# Patient Record
Sex: Female | Born: 1980 | Race: White | Hispanic: No | Marital: Married | Smoking: Former smoker
Health system: Southern US, Community
[De-identification: ages and names within clinical notes are randomized; demographics above are authoritative.]

## PROBLEM LIST (undated history)

## (undated) DIAGNOSIS — E7800 Pure hypercholesterolemia, unspecified: Secondary | ICD-10-CM

## (undated) DIAGNOSIS — F419 Anxiety disorder, unspecified: Secondary | ICD-10-CM

## (undated) DIAGNOSIS — K219 Gastro-esophageal reflux disease without esophagitis: Secondary | ICD-10-CM

## (undated) DIAGNOSIS — F32A Depression, unspecified: Secondary | ICD-10-CM

## (undated) DIAGNOSIS — M1990 Unspecified osteoarthritis, unspecified site: Secondary | ICD-10-CM

## (undated) DIAGNOSIS — E119 Type 2 diabetes mellitus without complications: Secondary | ICD-10-CM

## (undated) HISTORY — DX: Gastro-esophageal reflux disease without esophagitis: K21.9

## (undated) HISTORY — DX: Type 2 diabetes mellitus without complications: E11.9

## (undated) HISTORY — PX: TUBAL LIGATION: SHX77

---

## 2001-03-04 ENCOUNTER — Other Ambulatory Visit: Admission: RE | Admit: 2001-03-04 | Discharge: 2001-03-04 | Payer: Self-pay | Admitting: Obstetrics and Gynecology

## 2007-02-12 ENCOUNTER — Emergency Department (HOSPITAL_COMMUNITY): Admission: EM | Admit: 2007-02-12 | Discharge: 2007-02-13 | Payer: Self-pay | Admitting: Emergency Medicine

## 2014-06-18 ENCOUNTER — Inpatient Hospital Stay (HOSPITAL_COMMUNITY): Admit: 2014-06-18 | Payer: Self-pay

## 2014-06-18 ENCOUNTER — Emergency Department (HOSPITAL_COMMUNITY)
Admission: EM | Admit: 2014-06-18 | Discharge: 2014-06-18 | Disposition: A | Discharge status: Home / Self Care | Payer: Self-pay | Attending: Emergency Medicine | Admitting: Emergency Medicine

## 2014-06-18 ENCOUNTER — Encounter (HOSPITAL_COMMUNITY): Payer: Self-pay | Admitting: Emergency Medicine

## 2014-06-18 DIAGNOSIS — Z9851 Tubal ligation status: Secondary | ICD-10-CM | POA: Insufficient documentation

## 2014-06-18 DIAGNOSIS — R109 Unspecified abdominal pain: Secondary | ICD-10-CM | POA: Insufficient documentation

## 2014-06-18 DIAGNOSIS — Z862 Personal history of diseases of the blood and blood-forming organs and certain disorders involving the immune mechanism: Secondary | ICD-10-CM | POA: Insufficient documentation

## 2014-06-18 DIAGNOSIS — R1011 Right upper quadrant pain: Secondary | ICD-10-CM | POA: Insufficient documentation

## 2014-06-18 DIAGNOSIS — Z3202 Encounter for pregnancy test, result negative: Secondary | ICD-10-CM | POA: Insufficient documentation

## 2014-06-18 DIAGNOSIS — Z8639 Personal history of other endocrine, nutritional and metabolic disease: Secondary | ICD-10-CM | POA: Insufficient documentation

## 2014-06-18 DIAGNOSIS — F17200 Nicotine dependence, unspecified, uncomplicated: Secondary | ICD-10-CM | POA: Insufficient documentation

## 2014-06-18 HISTORY — DX: Pure hypercholesterolemia, unspecified: E78.00

## 2014-06-18 LAB — CBC WITH DIFFERENTIAL/PLATELET
BASOS ABS: 0 10*3/uL (ref 0.0–0.1)
BASOS PCT: 0 % (ref 0–1)
EOS ABS: 0.1 10*3/uL (ref 0.0–0.7)
EOS PCT: 1 % (ref 0–5)
HCT: 38.3 % (ref 36.0–46.0)
Hemoglobin: 13.8 g/dL (ref 12.0–15.0)
Lymphocytes Relative: 22 % (ref 12–46)
Lymphs Abs: 2.7 10*3/uL (ref 0.7–4.0)
MCH: 33.4 pg (ref 26.0–34.0)
MCHC: 36 g/dL (ref 30.0–36.0)
MCV: 92.7 fL (ref 78.0–100.0)
MONO ABS: 0.7 10*3/uL (ref 0.1–1.0)
Monocytes Relative: 6 % (ref 3–12)
Neutro Abs: 8.9 10*3/uL — ABNORMAL HIGH (ref 1.7–7.7)
Neutrophils Relative %: 71 % (ref 43–77)
Platelets: 295 10*3/uL (ref 150–400)
RBC: 4.13 MIL/uL (ref 3.87–5.11)
RDW: 12.9 % (ref 11.5–15.5)
WBC: 12.5 10*3/uL — ABNORMAL HIGH (ref 4.0–10.5)

## 2014-06-18 LAB — COMPREHENSIVE METABOLIC PANEL
ALBUMIN: 4.3 g/dL (ref 3.5–5.2)
ALT: 18 U/L (ref 0–35)
AST: 19 U/L (ref 0–37)
Alkaline Phosphatase: 43 U/L (ref 39–117)
Anion gap: 13 (ref 5–15)
BUN: 12 mg/dL (ref 6–23)
CALCIUM: 9.8 mg/dL (ref 8.4–10.5)
CO2: 21 mEq/L (ref 19–32)
CREATININE: 0.95 mg/dL (ref 0.50–1.10)
Chloride: 105 mEq/L (ref 96–112)
GFR calc Af Amer: 90 mL/min — ABNORMAL LOW (ref >90)
GFR calc non Af Amer: 77 mL/min — ABNORMAL LOW (ref >90)
Glucose, Bld: 104 mg/dL — ABNORMAL HIGH (ref 70–99)
Potassium: 3.9 mEq/L (ref 3.7–5.3)
Sodium: 139 mEq/L (ref 137–147)
TOTAL PROTEIN: 7.9 g/dL (ref 6.0–8.3)
Total Bilirubin: 0.2 mg/dL — ABNORMAL LOW (ref 0.3–1.2)

## 2014-06-18 LAB — URINALYSIS, ROUTINE W REFLEX MICROSCOPIC
Bilirubin Urine: NEGATIVE
GLUCOSE, UA: NEGATIVE mg/dL
Ketones, ur: NEGATIVE mg/dL
Nitrite: NEGATIVE
Protein, ur: NEGATIVE mg/dL
Specific Gravity, Urine: 1.015 (ref 1.005–1.030)
Urobilinogen, UA: 0.2 mg/dL (ref 0.0–1.0)
pH: 6 (ref 5.0–8.0)

## 2014-06-18 LAB — LIPASE, BLOOD: LIPASE: 36 U/L (ref 11–59)

## 2014-06-18 LAB — URINE MICROSCOPIC-ADD ON

## 2014-06-18 LAB — PREGNANCY, URINE: Preg Test, Ur: NEGATIVE

## 2014-06-18 MED ORDER — HYDROMORPHONE HCL PF 1 MG/ML IJ SOLN
1.0000 mg | Freq: Once | INTRAMUSCULAR | Status: AC
  Administered 2014-06-18: 1 mg via INTRAVENOUS
  Filled 2014-06-18: qty 1

## 2014-06-18 MED ORDER — SODIUM CHLORIDE 0.9 % IV SOLN
Freq: Once | INTRAVENOUS | Status: AC
  Administered 2014-06-18: 03:00:00 via INTRAVENOUS

## 2014-06-18 MED ORDER — OXYCODONE-ACETAMINOPHEN 5-325 MG PO TABS: 1.0000 | ORAL_TABLET | ORAL | Status: DC | PRN

## 2014-06-18 MED ORDER — ONDANSETRON HCL 4 MG/2ML IJ SOLN
4.0000 mg | Freq: Once | INTRAMUSCULAR | Status: AC
  Administered 2014-06-18: 4 mg via INTRAVENOUS
  Filled 2014-06-18: qty 2

## 2014-06-18 NOTE — ED Provider Notes (Signed)
CSN: [id number]     Arrival date & time 06/18/14  0205 History   First MD Initiated Contact with Patient 06/18/14 0303     Chief Complaint  Patient presents with  . Abdominal Pain     (Consider location/radiation/quality/duration/timing/severity/associated sxs/prior Treatment) Patient is a 33 y.o. female presenting with abdominal pain. The history is provided by the patient.  Abdominal Pain She had onset about 10 PM of severe right upper abdominal pain without radiation. Pain is described as both sharp and dull. Nothing makes it better nothing makes it worse. She rates pain a 10/10. There is no associated nausea or vomiting or diarrhea. She denies any dysuria. She has had similar pain in the past but has not sought medical attention for it. She is concerned that she might have gallstones. She had last eaten at about 5 PM and ate a hotdog at that time.   Past Medical History  Diagnosis Date  . Hypercholesteremia    Past Surgical History  Procedure Laterality Date  . Tubal ligation     History reviewed. No pertinent family history. History  Substance Use Topics  . Smoking status: Current Every Day Smoker -- 0.50 packs/day  . Smokeless tobacco: Not on file  . Alcohol Use: No   OB History   Grav Para Term Preterm Abortions TAB SAB Ect Mult Living                 Review of Systems  Gastrointestinal: Positive for abdominal pain.  All other systems reviewed and are negative.     Allergies  Review of patient's allergies indicates no known allergies.  Home Medications   Prior to Admission medications   Not on File   BP 124/77  Pulse 83  Temp(Src) 98.1 F (36.7 C) (Oral)  Resp 24  Ht  (1.626 m)  Wt 200 lb (90.719 kg)  BMI 34.31 kg/m2  SpO2 100%  LMP 05/29/2014 Physical Exam  Nursing note and vitals reviewed.  33 year old female, resting comfortably and in no acute distress. Vital signs are significant for tachypnea. Oxygen saturation is 100%, which is  normal. Head is normocephalic and atraumatic. PERRLA, EOMI. Oropharynx is clear. Neck is nontender and supple without adenopathy or JVD. Back is nontender and there is no CVA tenderness. Lungs are clear without rales, wheezes, or rhonchi. Chest is nontender. Heart has regular rate and rhythm without murmur. Abdomen is soft, flat, with moderate right upper quadrant tenderness. There is a rebound or guarding. There is no definite Murphy's sign. There are no masses or hepatosplenomegaly and peristalsis is hypoactive. Extremities have no cyanosis or edema, full range of motion is present. Skin is warm and dry without rash. Neurologic: Mental status is normal, cranial nerves are intact, there are no motor or sensory deficits.  ED Course  Procedures (including critical care time) Labs Review Results for orders placed during the hospital encounter of 06/18/14  PREGNANCY, URINE      Result Value Ref Range   Preg Test, Ur NEGATIVE  NEGATIVE  URINALYSIS, ROUTINE W REFLEX MICROSCOPIC      Result Value Ref Range   Color, Urine YELLOW  YELLOW   APPearance CLEAR  CLEAR   Specific Gravity, Urine 1.015  1.005 - 1.030   pH 6.0  5.0 - 8.0   Glucose, UA NEGATIVE  NEGATIVE mg/dL   Hgb urine dipstick SMALL (*) NEGATIVE   Bilirubin Urine NEGATIVE  NEGATIVE   Ketones, ur NEGATIVE  NEGATIVE mg/dL  Protein, ur NEGATIVE  NEGATIVE mg/dL   Urobilinogen, UA 0.2  0.0 - 1.0 mg/dL   Nitrite NEGATIVE  NEGATIVE   Leukocytes, UA MODERATE (*) NEGATIVE  URINE MICROSCOPIC-ADD ON      Result Value Ref Range   Squamous Epithelial / LPF FEW (*) RARE   WBC, UA 11-20  <3 WBC/hpf   RBC / HPF 3-6  <3 RBC/hpf   Bacteria, UA MANY (*) RARE  CBC WITH DIFFERENTIAL      Result Value Ref Range   WBC 12.5 (*) 4.0 - 10.5 K/uL   RBC 4.13  3.87 - 5.11 MIL/uL   Hemoglobin 13.8  12.0 - 15.0 g/dL   HCT 40.9  81.1 - 91.4 %   MCV 92.7  78.0 - 100.0 fL   MCH 33.4  26.0 - 34.0 pg   MCHC 36.0  30.0 - 36.0 g/dL   RDW 78.2  95.6 -  21.3 %   Platelets 295  150 - 400 K/uL   Neutrophils Relative % 71  43 - 77 %   Neutro Abs 8.9 (*) 1.7 - 7.7 K/uL   Lymphocytes Relative 22  12 - 46 %   Lymphs Abs 2.7  0.7 - 4.0 K/uL   Monocytes Relative 6  3 - 12 %   Monocytes Absolute 0.7  0.1 - 1.0 K/uL   Eosinophils Relative 1  0 - 5 %   Eosinophils Absolute 0.1  0.0 - 0.7 K/uL   Basophils Relative 0  0 - 1 %   Basophils Absolute 0.0  0.0 - 0.1 K/uL  COMPREHENSIVE METABOLIC PANEL      Result Value Ref Range   Sodium 139  137 - 147 mEq/L   Potassium 3.9  3.7 - 5.3 mEq/L   Chloride 105  96 - 112 mEq/L   CO2 21  19 - 32 mEq/L   Glucose, Bld 104 (*) 70 - 99 mg/dL   BUN 12  6 - 23 mg/dL   Creatinine, Ser 0.86  0.50 - 1.10 mg/dL   Calcium 9.8  8.4 - 57.8 mg/dL   Total Protein 7.9  6.0 - 8.3 g/dL   Albumin 4.3  3.5 - 5.2 g/dL   AST 19  0 - 37 U/L   ALT 18  0 - 35 U/L   Alkaline Phosphatase 43  39 - 117 U/L   Total Bilirubin 0.2 (*) 0.3 - 1.2 mg/dL   GFR calc non Af Amer 77 (*) >90 mL/min   GFR calc Af Amer 90 (*) >90 mL/min   Anion gap 13  5 - 15  LIPASE, BLOOD      Result Value Ref Range   Lipase 36  11 - 59 U/L   Limited bedside ultrasound: Indication: Abdominal pain Location: Right upper quadrant of abdomen Findings: No evidence of cholelithiasis or cholecystitis, no sonographic Murphy's sign Images archived electronically. Patient tolerated procedure well.  MDM   Final diagnoses:  Right upper quadrant pain    Right upper quadrant pain of uncertain cause. Screening labs have been obtained and she'll be given hydromorphone for pain. Old records are reviewed and she has no relevant past visits.  Laboratory workup is unremarkable. Limited bedside ultrasound showed no evidence of cholelithiasis but gallbladder was somewhat difficult to visualize as it was behind a pocket of gas and a cadaver fully image the entire length of the gallbladder. She is discharged with prescription for oxycodone and acetaminophen and she is to  return later today for formal  abdominal ultrasound.  Dione Booze, MD 06/18/14 [phone]

## 2014-06-18 NOTE — Discharge Instructions (Signed)
Return later today for the ultrasound. Do not eat or drink anything before the ultrasound.    Abdominal Pain Many things can cause abdominal pain. Usually, abdominal pain is not caused by a disease and will improve without treatment. It can often be observed and treated at home. Your health care provider will do a physical exam and possibly order blood tests and X-rays to help determine the seriousness of your pain. However, in many cases, more time must pass before a clear cause of the pain can be found. Before that point, your health care provider may not know if you need more testing or further treatment. HOME CARE INSTRUCTIONS  Monitor your abdominal pain for any changes. The following actions may help to alleviate any discomfort you are experiencing:  Only take over-the-counter or prescription medicines as directed by your health care provider.  Do not take laxatives unless directed to do so by your health care provider.  Try a clear liquid diet (broth, tea, or water) as directed by your health care provider. Slowly move to a bland diet as tolerated. SEEK MEDICAL CARE IF:  You have unexplained abdominal pain.  You have abdominal pain associated with nausea or diarrhea.  You have pain when you urinate or have a bowel movement.  You experience abdominal pain that wakes you in the night.  You have abdominal pain that is worsened or improved by eating food.  You have abdominal pain that is worsened with eating fatty foods.  You have a fever. SEEK IMMEDIATE MEDICAL CARE IF:   Your pain does not go away within 2 hours.  You keep throwing up (vomiting).  Your pain is felt only in portions of the abdomen, such as the right side or the left lower portion of the abdomen.  You pass bloody or black tarry stools. MAKE SURE YOU:  Understand these instructions.   Will watch your condition.   Will get help right away if you are not doing well or get worse.  Document Released:  07/09/2005 Document Revised: 10/04/2013 Document Reviewed: 06/08/2013 [facility] Patient Information 2015 [place], [place]. This information is not intended to replace advice given to you by your health care provider. Make sure you discuss any questions you have with your health care provider.  Acetaminophen; Oxycodone tablets What is this medicine? ACETAMINOPHEN; OXYCODONE (a set a MEE noe fen; ox i KOE done) is a pain reliever. It is used to treat mild to moderate pain. This medicine may be used for other purposes; ask your health care provider or pharmacist if you have questions. COMMON BRAND NAME(S): Endocet, Magnacet, Narvox, Percocet, Perloxx, Primalev, Primlev, Roxicet, Xolox What should I tell my health care provider before I take this medicine? They need to know if you have any of these conditions: -brain tumor -Crohn's disease, inflammatory bowel disease, or ulcerative colitis -drug abuse or addiction -head injury -heart or circulation problems -if you often drink alcohol -kidney disease or problems going to the bathroom -liver disease -lung disease, asthma, or breathing problems -an unusual or allergic reaction to acetaminophen, oxycodone, other opioid analgesics, other medicines, foods, dyes, or preservatives -pregnant or trying to get pregnant -breast-feeding How should I use this medicine? Take this medicine by mouth with a full glass of water. Follow the directions on the prescription label. Take your medicine at regular intervals. Do not take your medicine more often than directed. Talk to your pediatrician regarding the use of this medicine in children. Special care may be needed. Patients over 65  years old may have a stronger reaction and need a smaller dose. Overdosage: If you think you have taken too much of this medicine contact a poison control center or emergency room at once. NOTE: This medicine is only for you. Do not share this medicine with others. What if I miss a  dose? If you miss a dose, take it as soon as you can. If it is almost time for your next dose, take only that dose. Do not take double or extra doses. What may interact with this medicine? -alcohol -antihistamines -barbiturates like amobarbital, butalbital, butabarbital, methohexital, pentobarbital, phenobarbital, thiopental, and secobarbital -benztropine -drugs for bladder problems like solifenacin, trospium, oxybutynin, tolterodine, hyoscyamine, and methscopolamine -drugs for breathing problems like ipratropium and tiotropium -drugs for certain stomach or intestine problems like propantheline, homatropine methylbromide, glycopyrrolate, atropine, belladonna, and dicyclomine -general anesthetics like etomidate, ketamine, nitrous oxide, propofol, desflurane, enflurane, halothane, isoflurane, and sevoflurane -medicines for depression, anxiety, or psychotic disturbances -medicines for sleep -muscle relaxants -naltrexone -narcotic medicines (opiates) for pain -phenothiazines like perphenazine, thioridazine, chlorpromazine, mesoridazine, fluphenazine, prochlorperazine, promazine, and trifluoperazine -scopolamine -tramadol -trihexyphenidyl This list may not describe all possible interactions. Give your health care provider a list of all the medicines, herbs, non-prescription drugs, or dietary supplements you use. Also tell them if you smoke, drink alcohol, or use illegal drugs. Some items may interact with your medicine. What should I watch for while using this medicine? Tell your doctor or health care professional if your pain does not go away, if it gets worse, or if you have new or a different type of pain. You may develop tolerance to the medicine. Tolerance means that you will need a higher dose of the medication for pain relief. Tolerance is normal and is expected if you take this medicine for a long time. Do not suddenly stop taking your medicine because you may develop a severe reaction.  Your body becomes used to the medicine. This does NOT mean you are addicted. Addiction is a behavior related to getting and using a drug for a non-medical reason. If you have pain, you have a medical reason to take pain medicine. Your doctor will tell you how much medicine to take. If your doctor wants you to stop the medicine, the dose will be slowly lowered over time to avoid any side effects. You may get drowsy or dizzy. Do not drive, use machinery, or do anything that needs mental alertness until you know how this medicine affects you. Do not stand or sit up quickly, especially if you are an older patient. This reduces the risk of dizzy or fainting spells. Alcohol may interfere with the effect of this medicine. Avoid alcoholic drinks. There are different types of narcotic medicines (opiates) for pain. If you take more than one type at the same time, you may have more side effects. Give your health care provider a list of all medicines you use. Your doctor will tell you how much medicine to take. Do not take more medicine than directed. Call emergency for help if you have problems breathing. The medicine will cause constipation. Try to have a bowel movement at least every 2 to 3 days. If you do not have a bowel movement for 3 days, call your doctor or health care professional. Do not take Tylenol (acetaminophen) or medicines that have acetaminophen with this medicine. Too much acetaminophen can be very dangerous. Many nonprescription medicines contain acetaminophen. Always read the labels carefully to avoid taking more acetaminophen. What side effects may  I notice from receiving this medicine? Side effects that you should report to your doctor or health care professional as soon as possible: -allergic reactions like skin rash, itching or hives, swelling of the face, lips, or tongue -breathing difficulties, wheezing -confusion -light headedness or fainting spells -severe stomach pain -unusually weak or  tired -yellowing of the skin or the whites of the eyes Side effects that usually do not require medical attention (report to your doctor or health care professional if they continue or are bothersome): -dizziness -drowsiness -nausea -vomiting This list may not describe all possible side effects. Call your doctor for medical advice about side effects. You may report side effects to FDA at 1-800-FDA-1088. Where should I keep my medicine? Keep out of the reach of children. This medicine can be abused. Keep your medicine in a safe place to protect it from theft. Do not share this medicine with anyone. Selling or giving away this medicine is dangerous and against the law. Store at room temperature between 20 and 25 degrees C (68 and 77 degrees F). Keep container tightly closed. Protect from light. This medicine may cause accidental overdose and death if it is taken by other adults, children, or pets. Flush any unused medicine down the toilet to reduce the chance of harm. Do not use the medicine after the expiration date. NOTE: This sheet is a summary. It may not cover all possible information. If you have questions about this medicine, talk to your doctor, pharmacist, or health care provider.  2015, [employer]. (2013-05-23 13:17:35)

## 2014-06-18 NOTE — ED Notes (Signed)
Family member out to nurses station requesting pain medicine for her sister. I went in to pt's room and advised her that the dr would be in room shortly.

## 2014-06-19 ENCOUNTER — Other Ambulatory Visit (HOSPITAL_COMMUNITY): Payer: Self-pay

## 2014-06-19 ENCOUNTER — Other Ambulatory Visit (HOSPITAL_COMMUNITY): Payer: Self-pay | Admitting: Emergency Medicine

## 2014-06-19 ENCOUNTER — Ambulatory Visit (HOSPITAL_COMMUNITY)
Admission: RE | Admit: 2014-06-19 | Discharge: 2014-06-19 | Disposition: A | Discharge status: Home / Self Care | Payer: Self-pay | Source: Ambulatory Visit | Attending: Emergency Medicine | Admitting: Emergency Medicine

## 2014-06-19 DIAGNOSIS — R1011 Right upper quadrant pain: Secondary | ICD-10-CM

## 2014-06-19 DIAGNOSIS — K8020 Calculus of gallbladder without cholecystitis without obstruction: Secondary | ICD-10-CM | POA: Insufficient documentation

## 2014-06-19 NOTE — ED Notes (Signed)
Pt called and given [place] result. Instructed to follow up with her PCP

## 2014-07-10 NOTE — Patient Instructions (Signed)
[name]  07/10/2014   Your procedure is scheduled on:   07/13/2014  Report to [facility] at  950  AM.  Call this number if you have problems the morning of surgery: [phone]   Remember:   Do not eat food or drink liquids after midnight.   Take these medicines the morning of surgery with A SIP OF WATER: oxycodone   Do not wear jewelry, make-up or nail polish.  Do not wear lotions, powders, or perfumes.   Do not shave 48 hours prior to surgery. Men may shave face and neck.  Do not bring valuables to the hospital.  [facility] is not responsible for any belongings or valuables.               Contacts, dentures or bridgework may not be worn into surgery.  Leave suitcase in the car. After surgery it may be brought to your room.  For patients admitted to the hospital, discharge time is determined by your treatment team.               Patients discharged the day of surgery will not be allowed to drive home.  Name and phone number of your driver: family  Special Instructions: Shower using CHG 2 nights before surgery and the night before surgery.  If you shower the day of surgery use CHG.  Use special wash - you have one bottle of CHG for all showers.  You should use approximately 1/3 of the bottle for each shower.   Please read over the following fact sheets that you were given: Pain Booklet, Coughing and Deep Breathing, Surgical Site Infection Prevention, Anesthesia Post-op Instructions and Care and Recovery After Surgery Laparoscopic Cholecystectomy Laparoscopic cholecystectomy is surgery to remove the gallbladder. The gallbladder is located in the upper right part of the abdomen, behind the liver. It is a storage sac for bile produced in the liver. Bile aids in the digestion and absorption of fats. Cholecystectomy is often done for inflammation of the gallbladder (cholecystitis). This condition is usually caused by a buildup of gallstones (cholelithiasis) in your gallbladder.  Gallstones can block the flow of bile, resulting in inflammation and pain. In severe cases, emergency surgery may be required. When emergency surgery is not required, you will have time to prepare for the procedure. Laparoscopic surgery is an alternative to open surgery. Laparoscopic surgery has a shorter recovery time. Your common bile duct may also need to be examined during the procedure. If stones are found in the common bile duct, they may be removed. LET [facility] CARE PROVIDER KNOW ABOUT:  Any allergies you have.  All medicines you are taking, including vitamins, herbs, eye drops, creams, and over-the-counter medicines.  Previous problems you or members of your family have had with the use of anesthetics.  Any blood disorders you have.  Previous surgeries you have had.  Medical conditions you have. RISKS AND COMPLICATIONS Generally, this is a safe procedure. However, as with any procedure, complications can occur. Possible complications include:  Infection.  Damage to the common bile duct, nerves, arteries, veins, or other internal organs such as the stomach, liver, or intestines.  Bleeding.  A stone may remain in the common bile duct.  A bile leak from the cyst duct that is clipped when your gallbladder is removed.  The need to convert to open surgery, which requires a larger incision in the abdomen. This may be necessary if your surgeon thinks it is  not safe to continue with a laparoscopic procedure. BEFORE THE PROCEDURE  Ask your health care provider about changing or stopping any regular medicines. You will need to stop taking aspirin or blood thinners at least 5 days prior to surgery.  Do not eat or drink anything after midnight the night before surgery.  Let your health care provider know if you develop a cold or other infectious problem before surgery. PROCEDURE   You will be given medicine to make you sleep through the procedure (general anesthetic). A breathing  tube will be placed in your mouth.  When you are asleep, your surgeon will make several small cuts (incisions) in your abdomen.  A thin, lighted tube with a tiny camera on the end (laparoscope) is inserted through one of the small incisions. The camera on the laparoscope sends a picture to a TV screen in the operating room. This gives the surgeon a good view inside your abdomen.  A gas will be pumped into your abdomen. This expands your abdomen so that the surgeon has more room to perform the surgery.  Other tools needed for the procedure are inserted through the other incisions. The gallbladder is removed through one of the incisions.  After the removal of your gallbladder, the incisions will be closed with stitches, staples, or skin glue. AFTER THE PROCEDURE  You will be taken to a recovery area where your progress will be checked often.  You may be allowed to go home the same day if your pain is controlled and you can tolerate liquids. Document Released: 09/29/2005 Document Revised: 07/20/2013 Document Reviewed: 05/11/2013 [facility] Patient Information 2015 [place], [place]. This information is not intended to replace advice given to you by your health care provider. Make sure you discuss any questions you have with your health care provider. PATIENT INSTRUCTIONS POST-ANESTHESIA  IMMEDIATELY FOLLOWING SURGERY:  Do not drive or operate machinery for the first twenty four hours after surgery.  Do not make any important decisions for twenty four hours after surgery or while taking narcotic pain medications or sedatives.  If you develop intractable nausea and vomiting or a severe headache please notify your doctor immediately.  FOLLOW-UP:  Please make an appointment with your surgeon as instructed. You do not need to follow up with anesthesia unless specifically instructed to do so.  WOUND CARE INSTRUCTIONS (if applicable):  Keep a dry clean dressing on the anesthesia/puncture wound site if  there is drainage.  Once the wound has quit draining you may leave it open to air.  Generally you should leave the bandage intact for twenty four hours unless there is drainage.  If the epidural site drains for more than 36-48 hours please call the anesthesia department.  QUESTIONS?:  Please feel free to call your physician or the hospital operator if you have any questions, and they will be happy to assist you.

## 2014-07-11 ENCOUNTER — Encounter (HOSPITAL_COMMUNITY)
Admission: RE | Admit: 2014-07-11 | Discharge: 2014-07-11 | Disposition: A | Discharge status: Home / Self Care | Payer: Self-pay | Source: Ambulatory Visit | Attending: General Surgery | Admitting: General Surgery

## 2014-07-11 ENCOUNTER — Encounter (HOSPITAL_COMMUNITY): Payer: Self-pay

## 2014-07-11 DIAGNOSIS — Z01812 Encounter for preprocedural laboratory examination: Secondary | ICD-10-CM | POA: Insufficient documentation

## 2014-07-11 DIAGNOSIS — K8010 Calculus of gallbladder with chronic cholecystitis without obstruction: Secondary | ICD-10-CM | POA: Insufficient documentation

## 2014-07-11 LAB — CBC WITH DIFFERENTIAL/PLATELET
Basophils Absolute: 0 10*3/uL (ref 0.0–0.1)
Basophils Relative: 1 % (ref 0–1)
Eosinophils Absolute: 0.1 10*3/uL (ref 0.0–0.7)
Eosinophils Relative: 1 % (ref 0–5)
HEMATOCRIT: 37.5 % (ref 36.0–46.0)
Hemoglobin: 13.2 g/dL (ref 12.0–15.0)
LYMPHS PCT: 30 % (ref 12–46)
Lymphs Abs: 1.9 10*3/uL (ref 0.7–4.0)
MCH: 32.8 pg (ref 26.0–34.0)
MCHC: 35.2 g/dL (ref 30.0–36.0)
MCV: 93.3 fL (ref 78.0–100.0)
MONO ABS: 0.4 10*3/uL (ref 0.1–1.0)
Monocytes Relative: 6 % (ref 3–12)
NEUTROS ABS: 3.9 10*3/uL (ref 1.7–7.7)
NEUTROS PCT: 62 % (ref 43–77)
Platelets: 237 10*3/uL (ref 150–400)
RBC: 4.02 MIL/uL (ref 3.87–5.11)
RDW: 12.9 % (ref 11.5–15.5)
WBC: 6.3 10*3/uL (ref 4.0–10.5)

## 2014-07-11 LAB — BASIC METABOLIC PANEL
ANION GAP: 11 (ref 5–15)
BUN: 8 mg/dL (ref 6–23)
CO2: 23 meq/L (ref 19–32)
CREATININE: 0.91 mg/dL (ref 0.50–1.10)
Calcium: 9.6 mg/dL (ref 8.4–10.5)
Chloride: 104 mEq/L (ref 96–112)
GFR calc Af Amer: >90 mL/min (ref >90)
GFR calc non Af Amer: 81 mL/min — ABNORMAL LOW (ref >90)
Glucose, Bld: 89 mg/dL (ref 70–99)
Potassium: 4.2 mEq/L (ref 3.7–5.3)
Sodium: 138 mEq/L (ref 137–147)

## 2014-07-11 LAB — HEPATIC FUNCTION PANEL
ALT: 21 U/L (ref 0–35)
AST: 18 U/L (ref 0–37)
Albumin: 4.1 g/dL (ref 3.5–5.2)
Alkaline Phosphatase: 46 U/L (ref 39–117)
BILIRUBIN TOTAL: 0.3 mg/dL (ref 0.3–1.2)
Total Protein: 7.5 g/dL (ref 6.0–8.3)

## 2014-07-11 LAB — AMYLASE: Amylase: 27 U/L (ref 0–105)

## 2014-07-11 NOTE — Pre-Procedure Instructions (Signed)
Patient has multiple piercings that she states cannot be removed.Discussed with her at great length the risk for surgical burns. She verbalizes understanding and wants to proceed with procedure.

## 2014-07-11 NOTE — Pre-Procedure Instructions (Signed)
Patient given information to sign up for my chart at home. 

## 2014-07-13 ENCOUNTER — Ambulatory Visit (HOSPITAL_COMMUNITY)
Admission: RE | Admit: 2014-07-13 | Discharge: 2014-07-14 | Disposition: A | Discharge status: Home / Self Care | Payer: Medicaid Other | Source: Ambulatory Visit | Attending: General Surgery | Admitting: General Surgery

## 2014-07-13 ENCOUNTER — Ambulatory Visit (HOSPITAL_COMMUNITY): Payer: Medicaid Other | Admitting: Anesthesiology

## 2014-07-13 ENCOUNTER — Encounter (HOSPITAL_COMMUNITY)
Admission: RE | Disposition: A | Discharge status: Home / Self Care | Payer: Self-pay | Source: Ambulatory Visit | Attending: General Surgery

## 2014-07-13 ENCOUNTER — Encounter (HOSPITAL_COMMUNITY): Payer: Medicaid Other | Admitting: Anesthesiology

## 2014-07-13 ENCOUNTER — Encounter (HOSPITAL_COMMUNITY): Payer: Self-pay | Admitting: *Deleted

## 2014-07-13 DIAGNOSIS — F17210 Nicotine dependence, cigarettes, uncomplicated: Secondary | ICD-10-CM | POA: Insufficient documentation

## 2014-07-13 DIAGNOSIS — K8020 Calculus of gallbladder without cholecystitis without obstruction: Secondary | ICD-10-CM | POA: Diagnosis present

## 2014-07-13 DIAGNOSIS — E780 Pure hypercholesterolemia: Secondary | ICD-10-CM | POA: Insufficient documentation

## 2014-07-13 DIAGNOSIS — K8010 Calculus of gallbladder with chronic cholecystitis without obstruction: Secondary | ICD-10-CM | POA: Diagnosis present

## 2014-07-13 HISTORY — PX: CHOLECYSTECTOMY: SHX55

## 2014-07-13 SURGERY — LAPAROSCOPIC CHOLECYSTECTOMY
Anesthesia: General | Site: Abdomen

## 2014-07-13 MED ORDER — ONDANSETRON HCL 4 MG/2ML IJ SOLN
INTRAMUSCULAR | Status: AC
  Filled 2014-07-13: qty 2

## 2014-07-13 MED ORDER — ONDANSETRON HCL 4 MG/2ML IJ SOLN: 4.0000 mg | Freq: Four times a day (QID) | INTRAMUSCULAR | Status: DC | PRN

## 2014-07-13 MED ORDER — GLYCOPYRROLATE 0.2 MG/ML IJ SOLN
INTRAMUSCULAR | Status: AC
  Filled 2014-07-13: qty 3

## 2014-07-13 MED ORDER — INFLUENZA VAC SPLIT QUAD 0.5 ML IM SUSY
0.5000 mL | PREFILLED_SYRINGE | INTRAMUSCULAR | Status: AC
  Administered 2014-07-14: 0.5 mL via INTRAMUSCULAR
  Filled 2014-07-13: qty 0.5

## 2014-07-13 MED ORDER — NEOSTIGMINE METHYLSULFATE 10 MG/10ML IV SOLN
INTRAVENOUS | Status: AC
  Filled 2014-07-13: qty 1

## 2014-07-13 MED ORDER — MIDAZOLAM HCL 2 MG/2ML IJ SOLN
INTRAMUSCULAR | Status: AC
  Filled 2014-07-13: qty 2

## 2014-07-13 MED ORDER — DEXTROSE 5 % IV SOLN
INTRAVENOUS | Status: AC
  Filled 2014-07-13: qty 2

## 2014-07-13 MED ORDER — MIDAZOLAM HCL 2 MG/2ML IJ SOLN
1.0000 mg | INTRAMUSCULAR | Status: DC | PRN
  Administered 2014-07-13 (×2): 2 mg via INTRAVENOUS

## 2014-07-13 MED ORDER — DEXAMETHASONE SODIUM PHOSPHATE 4 MG/ML IJ SOLN
INTRAMUSCULAR | Status: AC
  Filled 2014-07-13: qty 1

## 2014-07-13 MED ORDER — FENTANYL CITRATE 0.05 MG/ML IJ SOLN
25.0000 ug | INTRAMUSCULAR | Status: DC | PRN
  Administered 2014-07-13 (×2): 50 ug via INTRAVENOUS

## 2014-07-13 MED ORDER — LIDOCAINE HCL (PF) 1 % IJ SOLN
INTRAMUSCULAR | Status: AC
  Filled 2014-07-13: qty 5

## 2014-07-13 MED ORDER — OXYCODONE-ACETAMINOPHEN 5-325 MG PO TABS
1.0000 | ORAL_TABLET | ORAL | Status: DC | PRN
  Administered 2014-07-14: 1 via ORAL
  Filled 2014-07-13: qty 1

## 2014-07-13 MED ORDER — PROPOFOL 10 MG/ML IV BOLUS
INTRAVENOUS | Status: DC | PRN
  Administered 2014-07-13: 170 mg via INTRAVENOUS

## 2014-07-13 MED ORDER — LACTATED RINGERS IV SOLN
INTRAVENOUS | Status: DC
  Administered 2014-07-13 (×2): via INTRAVENOUS

## 2014-07-13 MED ORDER — GLYCOPYRROLATE 0.2 MG/ML IJ SOLN
INTRAMUSCULAR | Status: AC
  Filled 2014-07-13: qty 1

## 2014-07-13 MED ORDER — ROCURONIUM BROMIDE 50 MG/5ML IV SOLN
INTRAVENOUS | Status: AC
  Filled 2014-07-13: qty 1

## 2014-07-13 MED ORDER — FENTANYL CITRATE 0.05 MG/ML IJ SOLN
INTRAMUSCULAR | Status: AC
  Filled 2014-07-13: qty 2

## 2014-07-13 MED ORDER — FENTANYL CITRATE 0.05 MG/ML IJ SOLN
INTRAMUSCULAR | Status: AC
  Filled 2014-07-13: qty 5

## 2014-07-13 MED ORDER — NEOSTIGMINE METHYLSULFATE 10 MG/10ML IV SOLN
INTRAVENOUS | Status: DC | PRN
  Administered 2014-07-13: 4 mg via INTRAVENOUS

## 2014-07-13 MED ORDER — WATER FOR IRRIGATION, STERILE IR SOLN
Status: DC | PRN
  Administered 2014-07-13: 2000 mL

## 2014-07-13 MED ORDER — LIDOCAINE HCL 1 % IJ SOLN
INTRAMUSCULAR | Status: DC | PRN
  Administered 2014-07-13: 40 mg via INTRADERMAL

## 2014-07-13 MED ORDER — ROCURONIUM BROMIDE 100 MG/10ML IV SOLN
INTRAVENOUS | Status: DC | PRN
  Administered 2014-07-13: 35 mg via INTRAVENOUS
  Administered 2014-07-13: 10 mg via INTRAVENOUS
  Administered 2014-07-13: 5 mg via INTRAVENOUS

## 2014-07-13 MED ORDER — MORPHINE SULFATE 2 MG/ML IJ SOLN
1.0000 mg | INTRAMUSCULAR | Status: DC | PRN
  Administered 2014-07-13 – 2014-07-14 (×3): 1 mg via INTRAVENOUS
  Filled 2014-07-13 (×3): qty 1

## 2014-07-13 MED ORDER — GLYCOPYRROLATE 0.2 MG/ML IJ SOLN
INTRAMUSCULAR | Status: DC | PRN
  Administered 2014-07-13: .6 mg via INTRAVENOUS

## 2014-07-13 MED ORDER — BUPIVACAINE HCL (PF) 0.5 % IJ SOLN
INTRAMUSCULAR | Status: DC | PRN
  Administered 2014-07-13: 10 mL

## 2014-07-13 MED ORDER — ONDANSETRON HCL 4 MG PO TABS: 4.0000 mg | ORAL_TABLET | Freq: Four times a day (QID) | ORAL | Status: DC | PRN

## 2014-07-13 MED ORDER — PROPOFOL 10 MG/ML IV BOLUS
INTRAVENOUS | Status: AC
  Filled 2014-07-13: qty 20

## 2014-07-13 MED ORDER — BUPIVACAINE HCL (PF) 0.5 % IJ SOLN
INTRAMUSCULAR | Status: AC
  Filled 2014-07-13: qty 30

## 2014-07-13 MED ORDER — LORAZEPAM 1 MG PO TABS
1.0000 mg | ORAL_TABLET | Freq: Every day | ORAL | Status: DC
  Administered 2014-07-13: 1 mg via ORAL
  Filled 2014-07-13: qty 1

## 2014-07-13 MED ORDER — HEMOSTATIC AGENTS (NO CHARGE) OPTIME
TOPICAL | Status: DC | PRN
  Administered 2014-07-13: 1 via TOPICAL

## 2014-07-13 MED ORDER — SODIUM CHLORIDE 0.9 % IR SOLN
Status: DC | PRN
  Administered 2014-07-13: 3000 mL

## 2014-07-13 MED ORDER — SUCCINYLCHOLINE CHLORIDE 20 MG/ML IJ SOLN
INTRAMUSCULAR | Status: AC
  Filled 2014-07-13: qty 1

## 2014-07-13 MED ORDER — CEFTRIAXONE SODIUM 2 G IJ SOLR
2.0000 g | Freq: Once | INTRAMUSCULAR | Status: AC
  Administered 2014-07-13: 2 g via INTRAVENOUS

## 2014-07-13 MED ORDER — DOCUSATE SODIUM 100 MG PO CAPS
100.0000 mg | ORAL_CAPSULE | Freq: Every day | ORAL | Status: DC
  Administered 2014-07-13 – 2014-07-14 (×2): 100 mg via ORAL
  Filled 2014-07-13 (×2): qty 1

## 2014-07-13 MED ORDER — SODIUM CHLORIDE 0.9 % IR SOLN
Status: DC | PRN
  Administered 2014-07-13: 500 mL

## 2014-07-13 MED ORDER — DEXAMETHASONE SODIUM PHOSPHATE 4 MG/ML IJ SOLN
4.0000 mg | Freq: Once | INTRAMUSCULAR | Status: AC
  Administered 2014-07-13: 4 mg via INTRAVENOUS

## 2014-07-13 MED ORDER — ONDANSETRON HCL 4 MG/2ML IJ SOLN
4.0000 mg | Freq: Once | INTRAMUSCULAR | Status: AC
  Administered 2014-07-13: 4 mg via INTRAVENOUS

## 2014-07-13 MED ORDER — POTASSIUM CHLORIDE IN NACL 20-0.9 MEQ/L-% IV SOLN
INTRAVENOUS | Status: DC
  Administered 2014-07-13 – 2014-07-14 (×2): via INTRAVENOUS

## 2014-07-13 MED ORDER — ONDANSETRON HCL 4 MG/2ML IJ SOLN: 4.0000 mg | Freq: Once | INTRAMUSCULAR | Status: DC | PRN

## 2014-07-13 MED ORDER — FENTANYL CITRATE 0.05 MG/ML IJ SOLN
INTRAMUSCULAR | Status: DC | PRN
  Administered 2014-07-13 (×8): 50 ug via INTRAVENOUS

## 2014-07-13 SURGICAL SUPPLY — 66 items
APPLICATOR COTTON TIP 6IN STRL (MISCELLANEOUS) ×2 IMPLANT
APPLIER CLIP LAPSCP 10X32 DD (CLIP) ×2 IMPLANT
ATTRACTOMAT 16X20 MAGNETIC DRP (DRAPES) IMPLANT
BAG HAMPER (MISCELLANEOUS) ×2 IMPLANT
BLADE 15 SAFETY STRL DISP (BLADE) ×2 IMPLANT
CLOTH BEACON ORANGE TIMEOUT ST (SAFETY) ×2 IMPLANT
COVER LIGHT HANDLE STERIS (MISCELLANEOUS) ×4 IMPLANT
DECANTER SPIKE VIAL GLASS SM (MISCELLANEOUS) ×2 IMPLANT
DISSECTOR BLUNT TIP ENDO 5MM (MISCELLANEOUS) ×2 IMPLANT
DRAPE WARM FLUID 44X44 (DRAPE) IMPLANT
DRSG TEGADERM 2-3/8X2-3/4 SM (GAUZE/BANDAGES/DRESSINGS) ×6 IMPLANT
ELECT BLADE 6 FLAT ULTRCLN (ELECTRODE) IMPLANT
ELECT REM PT RETURN 9FT ADLT (ELECTROSURGICAL) ×2
ELECTRODE REM PT RTRN 9FT ADLT (ELECTROSURGICAL) ×1 IMPLANT
EVACUATOR DRAINAGE 10X20 100CC (DRAIN) ×1 IMPLANT
EVACUATOR SILICONE 100CC (DRAIN) ×1
FILTER SMOKE EVAC LAPAROSHD (FILTER) ×2 IMPLANT
FORMALIN 10 PREFIL 120ML (MISCELLANEOUS) ×2 IMPLANT
GAUZE SPONGE 4X4 12PLY STRL (GAUZE/BANDAGES/DRESSINGS) ×2 IMPLANT
GLOVE ECLIPSE 6.5 STRL STRAW (GLOVE) ×4 IMPLANT
GLOVE ECLIPSE 7.0 STRL STRAW (GLOVE) ×2 IMPLANT
GLOVE INDICATOR 7.0 STRL GRN (GLOVE) ×4 IMPLANT
GLOVE INDICATOR 7.5 STRL GRN (GLOVE) ×2 IMPLANT
GLOVE SKINSENSE NS SZ7.0 (GLOVE) ×1
GLOVE SKINSENSE STRL SZ7.0 (GLOVE) ×1 IMPLANT
GOWN STRL REUS W/TWL LRG LVL3 (GOWN DISPOSABLE) ×6 IMPLANT
HEMOSTAT SURGICEL 4X8 (HEMOSTASIS) ×2 IMPLANT
INST SET LAPROSCOPIC AP (KITS) ×2 IMPLANT
IV NS IRRIG 3000ML ARTHROMATIC (IV SOLUTION) ×2 IMPLANT
KIT ROOM TURNOVER APOR (KITS) ×2 IMPLANT
MANIFOLD NEPTUNE II (INSTRUMENTS) ×2 IMPLANT
NEEDLE INSUFFLATION 14GA 120MM (NEEDLE) ×2 IMPLANT
NS IRRIG 1000ML POUR BTL (IV SOLUTION) ×2 IMPLANT
PACK LAP CHOLE LZT030E (CUSTOM PROCEDURE TRAY) ×2 IMPLANT
PAD ARMBOARD 7.5X6 YLW CONV (MISCELLANEOUS) ×2 IMPLANT
PENCIL HANDSWITCHING (ELECTRODE) IMPLANT
POUCH SPECIMEN RETRIEVAL 10MM (ENDOMECHANICALS) ×2 IMPLANT
SET BASIN LINEN APH (SET/KITS/TRAYS/PACK) ×2 IMPLANT
SET TUBE IRRIG SUCTION NO TIP (IRRIGATION / IRRIGATOR) ×2 IMPLANT
SLEEVE ENDOPATH XCEL 5M (ENDOMECHANICALS) ×2 IMPLANT
SOL PREP PROV IODINE SCRUB 4OZ (MISCELLANEOUS) ×2 IMPLANT
SPONGE DRAIN TRACH 4X4 STRL 2S (GAUZE/BANDAGES/DRESSINGS) ×2 IMPLANT
SPONGE GAUZE 4X4 12PLY (GAUZE/BANDAGES/DRESSINGS) ×2 IMPLANT
SPONGE INTESTINAL PEANUT (DISPOSABLE) IMPLANT
SPONGE LAP 18X18 X RAY DECT (DISPOSABLE) IMPLANT
STAPLER VISISTAT 35W (STAPLE) ×2 IMPLANT
SUT ETHILON 3 0 FSL (SUTURE) ×2 IMPLANT
SUT SILK 2 0 (SUTURE)
SUT SILK 2 0 SH (SUTURE) IMPLANT
SUT SILK 2-0 18XBRD TIE 12 (SUTURE) IMPLANT
SUT SILK 3 0 SH CR/8 (SUTURE) IMPLANT
SUT VIC AB 0 CT1 27 (SUTURE)
SUT VIC AB 0 CT1 27XBRD ANTBC (SUTURE) IMPLANT
SUT VIC AB 0 CT1 27XCR 8 STRN (SUTURE) IMPLANT
SUT VICRYL 0 UR6 27IN ABS (SUTURE) ×2 IMPLANT
SYR BULB IRRIGATION 50ML (SYRINGE) IMPLANT
TAPE CLOTH SURG 4X10 WHT LF (GAUZE/BANDAGES/DRESSINGS) ×2 IMPLANT
TOWEL OR 17X26 4PK STRL BLUE (TOWEL DISPOSABLE) ×2 IMPLANT
TRAY FOLEY CATH 16FR SILVER (SET/KITS/TRAYS/PACK) ×2 IMPLANT
TROCAR ENDO BLADELESS 11MM (ENDOMECHANICALS) ×2 IMPLANT
TROCAR XCEL NON-BLD 5MMX100MML (ENDOMECHANICALS) ×2 IMPLANT
TROCAR XCEL UNIV SLVE 11M 100M (ENDOMECHANICALS) ×2 IMPLANT
TUBING INSUFFLATION HIGH FLOW (TUBING) ×2 IMPLANT
WARMER LAPAROSCOPE (MISCELLANEOUS) ×2 IMPLANT
WATER STERILE IRR 1000ML POUR (IV SOLUTION) ×4 IMPLANT
YANKAUER SUCT BULB TIP 10FT TU (MISCELLANEOUS) IMPLANT

## 2014-07-13 NOTE — Op Note (Signed)
[name]:  [name], Jocelyn                ACCOUNT NO.:  [id number]  MEDICAL RECORD NO.:  [id number]  LOCATION:  A303                          FACILITY:  [facility]  PHYSICIAN:  Barbaraann BarthelWilliam Pharaoh Pio, M.D. DATE OF BIRTH:  January 12, 1981  DATE OF PROCEDURE:  07/13/2014 DATE OF DISCHARGE:                              OPERATIVE REPORT   DIAGNOSES:  Cholecystitis, cholelithiasis.  PROCEDURE:  Laparoscopic cholecystectomy.  SPECIMEN:  Gallbladder and stones.  Wound classification contaminated.  NOTE:  This is a 33 year old, white obese female who [facility] with signs and symptoms of gallbladder colic.  She was seen in the emergency room prior to my seeing her as well.  We discussed the need for surgery discussing complications, not limited to, but including bleeding, infection, damage to bile ducts, perforation of organs, transitory diarrhea, and the possibility of open surgery might be required.  Informed consent was obtained.  GROSS OPERATIVE FINDINGS:  The patient had multiple stones within the gallbladder.  They were fairly large I would say almost 2-3 cm in diameter.  The cystic duct was of normal caliber.  Rest of the right upper quadrant was grossly within normal limits.  TECHNIQUE:  The patient was placed in supine position and after the adequate administration of general anesthesia via endotracheal intubation, her entire abdomen was prepped with Betadine solution and draped in usual manner.  Prior to this, a Foley catheter was aseptically inserted.  An incision was made above the umbilicus through skin, subcutaneous tissue, down to the fascia which was grasped with a sharp towel clip and elevated and a Veress needle was inserted and confirmed the position with a saline drop test.  Then, using the Visiport technique, 11-mm cannula was placed there and then under direct vision, another 11-mm cannula was placed in the epigastrium and two 5-mm cannulas in the right upper quadrant  laterally.  The gallbladder was grasped, its adhesions were taken down.  The cystic duct was dissected to see its entrance into the gallbladder was triply silver clipped and divided as was the cystic artery.  The gallbladder was removed without problems from the liver bed using the hook cautery device.  I elected to leave a piece of Surgicel in the liver bed after irrigating and checking for hemostasis, as well as a Jackson-Pratt drain which exited through the lateral most incisions.  After checking for hemostasis, we then desufflated the abdomen and I closed the fascia in the area of the epigastrium and the umbilicus with 0 Polysorb suture using 0.5% Sensorcaine to help with postoperative comfort.  The wounds were irrigated.  The skin was approximated with stapling device.  Prior to closure, all sponge, needle, and instrument counts were found to be correct.  The drain was sutured in place with 3- 0 nylon.  There were no complications.  The patient received approximately 1300 mL of crystalloids intraoperatively with perhaps 25 mL to 50 mL blood loss.     Barbaraann BarthelWilliam Tad Fancher, M.D.     WB/MEDQ  D:  07/13/2014  T:  07/13/2014  Job:  [id number]  cc:   [facility]

## 2014-07-13 NOTE — Anesthesia Procedure Notes (Signed)
Procedure Name: Intubation Performed by: Moshe SalisburyANIEL, Colon Rueth E Pre-anesthesia Checklist: Patient identified, Patient being monitored, Timeout performed, Emergency Drugs available and Suction available Patient Re-evaluated:Patient Re-evaluated prior to inductionOxygen Delivery Method: Circle System Utilized Preoxygenation: Pre-oxygenation with 100% oxygen Intubation Type: IV induction Ventilation: Mask ventilation without difficulty Laryngoscope Size: Mac and 3 Grade View: Grade II Tube type: Oral Tube size: 7.0 mm Number of attempts: 1 Airway Equipment and Method: stylet Placement Confirmation: ETT inserted through vocal cords under direct vision,  positive ETCO2 and breath sounds checked- equal and bilateral Secured at: 21 cm Tube secured with: Tape Dental Injury: Teeth and Oropharynx as per pre-operative assessment

## 2014-07-13 NOTE — Transfer of Care (Signed)
Immediate Anesthesia Transfer of Care Note  Patient: [name]  Procedure(s) Performed: Procedure(s): LAPAROSCOPIC CHOLECYSTECTOMY (N/A)  Patient Location: PACU  Anesthesia Type:General  Level of Consciousness: awake, alert  and oriented  Airway & Oxygen Therapy: Patient Spontanous Breathing and Patient connected to face mask oxygen  Post-op Assessment: Report given to PACU RN  Post vital signs: Reviewed and stable  Complications: No apparent anesthesia complications

## 2014-07-13 NOTE — Brief Op Note (Signed)
07/13/2014  2:51 PM  PATIENT:  [name]  33 y.o. female  PRE-OPERATIVE DIAGNOSIS:  cholelithiasis  POST-OPERATIVE DIAGNOSIS:  cholelithiasis  PROCEDURE:  Procedure(s): LAPAROSCOPIC CHOLECYSTECTOMY (N/A)  SURGEON:  Surgeon(s) and Role:    * Marlane HatcherWilliam S Brittania Sudbeck, MD - Primary  PHYSICIAN ASSISTANT:   ASSISTANTS: none   ANESTHESIA:   general  EBL:  Total I/O In: 1000 [I.V.:1000] Out: 200 [Urine:150; Blood:50]  BLOOD ADMINISTERED:none  DRAINS: JP drain in liver bed.  LOCAL MEDICATIONS USED:  MARCAINE 0.5%  ~ 10 cc    SPECIMEN:  Source of Specimen:  gall bladder and stones.  DISPOSITION OF SPECIMEN:  PATHOLOGY  COUNTS:  YES  TOURNIQUET:  * No tourniquets in log *  DICTATION: .Other Dictation: Dictation Number OR dict # [id number].  PLAN OF CARE: Admit for overnight observation  PATIENT DISPOSITION:  PACU - hemodynamically stable.   Delay start of Pharmacological VTE agent (>24hrs) due to surgical blood loss or risk of bleeding: not applicable

## 2014-07-13 NOTE — Progress Notes (Signed)
33 yr old W. Female for lap cholecystectomy for cholecystitis secondary to cholelithiasis.  Procedure and risks explained and informed consent obtained.  Labs reviewed and no clinical change since h&p dict # [id number].  Filed Vitals:   07/13/14 1008  BP: 109/68  Temp: 98 F (36.7 C)  Resp: 25  PULSE 59  O2 SAT 96% RA

## 2014-07-13 NOTE — Anesthesia Postprocedure Evaluation (Signed)
  Anesthesia Post-op Note  Patient: [name]  Procedure(s) Performed: Procedure(s): LAPAROSCOPIC CHOLECYSTECTOMY (N/A)  Patient Location: PACU  Anesthesia Type:General  Level of Consciousness: awake, alert  and oriented  Airway and Oxygen Therapy: Patient Spontanous Breathing and Patient connected to face mask oxygen  Post-op Pain: mild  Post-op Assessment: Post-op Vital signs reviewed, Patient's Cardiovascular Status Stable, Respiratory Function Stable, Patent Airway and No signs of Nausea or vomiting  Post-op Vital Signs: Reviewed and stable  Last Vitals:  Filed Vitals:   07/13/14 1008  BP: 109/68  Temp: 36.7 C  Resp: 25    Complications: No apparent anesthesia complications

## 2014-07-13 NOTE — Progress Notes (Signed)
Post OP Check  Filed Vitals:   07/13/14 1530  BP: 124/67  Pulse: 56  Temp:   Resp: 18   Awake and alert.  Dressings dry and in tact.  Minimal bloody drainage.  Has not voided yet.  Pain under control.  Doing well post op.

## 2014-07-13 NOTE — Anesthesia Preprocedure Evaluation (Signed)
Anesthesia Evaluation  Patient identified by MRN, date of birth, ID band Patient awake    Reviewed: Allergy & Precautions, H&P , NPO status , Patient's Chart, lab work & pertinent test results  Airway Mallampati: I TM Distance: >3 FB     Dental  (+) Teeth Intact   Pulmonary Current Smoker (am cough),  breath sounds clear to auscultation        Cardiovascular negative cardio ROS  Rhythm:Regular Rate:Normal     Neuro/Psych    GI/Hepatic negative GI ROS,   Endo/Other    Renal/GU      Musculoskeletal   Abdominal   Peds  Hematology   Anesthesia Other Findings   Reproductive/Obstetrics                           Anesthesia Physical Anesthesia Plan  ASA: II  Anesthesia Plan: General   Post-op Pain Management:    Induction: Intravenous  Airway Management Planned: Oral ETT  Additional Equipment:   Intra-op Plan:   Post-operative Plan: Extubation in OR  Informed Consent: I have reviewed the patients History and Physical, chart, labs and discussed the procedure including the risks, benefits and alternatives for the proposed anesthesia with the patient or authorized representative who has indicated his/her understanding and acceptance.     Plan Discussed with:   Anesthesia Plan Comments:         Anesthesia Quick Evaluation

## 2014-07-14 ENCOUNTER — Encounter (HOSPITAL_COMMUNITY): Payer: Self-pay | Admitting: General Surgery

## 2014-07-14 DIAGNOSIS — K8020 Calculus of gallbladder without cholecystitis without obstruction: Secondary | ICD-10-CM | POA: Diagnosis not present

## 2014-07-14 LAB — CBC
HCT: 37.5 % (ref 36.0–46.0)
Hemoglobin: 13.1 g/dL (ref 12.0–15.0)
MCH: 32.3 pg (ref 26.0–34.0)
MCHC: 34.9 g/dL (ref 30.0–36.0)
MCV: 92.4 fL (ref 78.0–100.0)
Platelets: 263 10*3/uL (ref 150–400)
RBC: 4.06 MIL/uL (ref 3.87–5.11)
RDW: 12.7 % (ref 11.5–15.5)
WBC: 14.9 10*3/uL — ABNORMAL HIGH (ref 4.0–10.5)

## 2014-07-14 LAB — HEPATIC FUNCTION PANEL
ALK PHOS: 46 U/L (ref 39–117)
ALT: 37 U/L — ABNORMAL HIGH (ref 0–35)
AST: 44 U/L — AB (ref 0–37)
Albumin: 3.7 g/dL (ref 3.5–5.2)
BILIRUBIN TOTAL: 0.4 mg/dL (ref 0.3–1.2)
Bilirubin, Direct: <0.2 mg/dL (ref 0.0–0.3)
Total Protein: 7.3 g/dL (ref 6.0–8.3)

## 2014-07-14 LAB — BASIC METABOLIC PANEL
Anion gap: 16 — ABNORMAL HIGH (ref 5–15)
BUN: 14 mg/dL (ref 6–23)
CO2: 19 mEq/L (ref 19–32)
Calcium: 9.2 mg/dL (ref 8.4–10.5)
Chloride: 104 mEq/L (ref 96–112)
Creatinine, Ser: 0.8 mg/dL (ref 0.50–1.10)
GFR calc non Af Amer: >90 mL/min (ref >90)
GLUCOSE: 86 mg/dL (ref 70–99)
Potassium: 4.1 mEq/L (ref 3.7–5.3)
Sodium: 139 mEq/L (ref 137–147)

## 2014-07-14 MED ORDER — DSS 100 MG PO CAPS: 100.0000 mg | ORAL_CAPSULE | Freq: Every day | ORAL | Status: DC

## 2014-07-14 MED ORDER — OXYCODONE-ACETAMINOPHEN 5-325 MG PO TABS: 1.0000 | ORAL_TABLET | ORAL | Status: DC | PRN

## 2014-07-14 NOTE — Anesthesia Postprocedure Evaluation (Signed)
  Anesthesia Post-op Note  Patient: [name]  Procedure(s) Performed: Procedure(s): LAPAROSCOPIC CHOLECYSTECTOMY (N/A)  Patient Location: Nursing Unit  Anesthesia Type:General  Level of Consciousness: awake, alert  and oriented  Airway and Oxygen Therapy: Patient Spontanous Breathing  Post-op Pain: mild  Post-op Assessment: Post-op Vital signs reviewed, Patient's Cardiovascular Status Stable, Respiratory Function Stable, Patent Airway and Adequate PO intake  Post-op Vital Signs: Reviewed and stable  Last Vitals:  Filed Vitals:   07/14/14 1022  BP: 113/61  Pulse: 48  Temp: 37.2 C  Resp: 20    Complications: No apparent anesthesia complications

## 2014-07-14 NOTE — Progress Notes (Signed)
POD # 1  Filed Vitals:   07/14/14 1022  BP: 113/61  Pulse: 48  Temp: 98.9 F (37.2 C)  Resp: 20    Wound clean.  Minimal JP drainage.  Abdomen is soft and pt tolerating PO well.  Drain removed.  Labs reviewed.  No bump in Bili.  Discharge and follow up arranged.  Dictation note dictated, dict.# [id number].

## 2014-07-14 NOTE — Discharge Summary (Signed)
NAMStevie Kern:  Tomasik, Jaeli                ACCOUNT NO.:  [id number]  MEDICAL RECORD NO.:  [id number]  LOCATION:  A303                          FACILITY:  [facility]  PHYSICIAN:  Barbaraann BarthelWilliam Sylvester Minton, M.D. DATE OF BIRTH:  03/25/1981  DATE OF ADMISSION:  07/13/2014 DATE OF DISCHARGE:  10/02/2015LH                              DISCHARGE SUMMARY   DIAGNOSES:  Cholecystitis and cholelithiasis.  PROCEDURE:  On July 13, 2014, laparoscopic cholecystectomy.  NOTE:  This is a 33 year old white female who [facility].  She was seen in the emergency room preoperatively, and she was noted to have a large stone within the gallbladder.  Liver function studies preoperatively were grossly within normal limits with a normal lipase.  We admitted her via the outpatient department for laparoscopic procedure.  We discussed preoperatively complications not limited to but including bleeding, infection, damage to bile ducts, perforation of organs, transitory diarrhea, and the possibility of open surgery might be required.  She underwent surgery uneventfully and was discharged on the following postoperative day.  At the time of discharge, her wound was clean without any signs of infections.  The Jackson-Pratt drain was serous in nature and was diminishing in amount.  She had no dysuria, leg pain, shortness of breath, or chest pain.  She was tolerating p.o. well, and her abdomen was soft.  We discharged her with arrangements made to follow up in a week's time and discharge instructions were given.  She was also told to contact my office or go to the emergency room should there be any acute changes.     Barbaraann BarthelWilliam Mickal Meno, M.D.     WB/MEDQ  D:  07/14/2014  T:  07/14/2014  Job:  [id number]

## 2014-07-14 NOTE — Addendum Note (Signed)
Addendum created 07/14/14 1309 by Moshe SalisburyKaren E Advith Martine, CRNA   Modules edited: Notes Section   Notes Section:  File: [id number]

## 2014-07-14 NOTE — H&P (Signed)
[name], [name]                ACCOUNT NO.:  [id number]  MEDICAL RECORD NO.:  [id number]  LOCATION:                                 FACILITY:  PHYSICIAN:  Barbaraann Barthel, M.D. DATE OF BIRTH:  [birth year]  DATE OF ADMISSION:  07/13/2014 DATE OF DISCHARGE:  LH                             HISTORY & PHYSICAL   NOTE:  Surgery was asked to see this 33 year old white female for gallbladder disease.  In essence, she has had recurrent symptoms requiring visits to the emergency room, and she was referred ultimately from the [facility] for gallbladder surgery due to cholelithiasis.  She has had recurrent problems of postprandial pain, nausea, and vomiting and as an outpatient, she had liver function studies which were grossly within normal limits and a sonogram which showed some slight thickening of her gallbladder with multiple stones present.  She was seen in the office on July 10, 2014, where she was asymptomatic at that time with some mild right upper quadrant pain without any rebound.  PAST MEDICAL HISTORY:  Fairly unremarkable.  She has had hypercholesterolemia.  PAST SURGERIES:  Only include a tubal ligation.  MEDICATIONS:  See medication list.  ALLERGIES:  She has no known allergies.  SOCIAL HISTORY:  She smokes about a half a pack of cigarettes per day. She does not have any history of alcohol abuse or recreational drug use.  PHYSICAL EXAMINATION:  VITAL SIGNS:  She is 5 feet 4 inches, weighs 195 pounds.  Temperature is 98.8, pulse rate 62, respirations 12, blood pressure 100/60. HEENT:  Head is normocephalic.  Eyes, extraocular movements are intact. Pupils are round and reactive to light and accommodation.  There is no conjunctival pallor or scleral injection.  The sclera has a normal tincture.  There is no adenopathy appreciated.  No bruits are appreciated, and there is no thyromegaly.  The patient has various piercings including her tongue, her lip, and her  nose. CHEST:  Fairly clear. HEART:  Regular rhythm. BREASTS:  Breasts and axillae are without masses. ABDOMEN:  Soft, with no rebound tenderness.  She has some guarding in the right upper quadrant, but no tenderness, no costovertebral tenderness on percussion. RECTAL AND PELVIC:  Deferred. EXTREMITIES:  Within normal limits.  REVIEW OF SYSTEMS:  NEURO:  No history of migraines or seizures or any lateralizing neurological findings.  ENDOCRINE:  No history of diabetes, thyroid disease, or adrenal problems.  CARDIOPULMONARY:  History of hypercholesterolemia and tobacco use.  MUSCULOSKELETAL:  The patient is overweight.  SKIN/INTEGUMENT:  The patient has multiple tattoos all over her body.  OB/GYN:  She is a gravida 2, para 2, cesarean 0, abortus 0, female with no family history of carcinoma of the breast.  Past mammogram was in 2015 reported as negative.  GI:  Right upper quadrant pain that is postprandial in nature, radiating to her back and accompanied with nausea and vomiting for approximately 3 months duration.  No past history of constipation, diarrhea, bright red rectal bleeding, or melena.  No history of inflammatory bowel disease or irritable bowel syndrome.  No history of unexplained weight loss.  The patient has never had a colonoscopy.  No  past history of hepatitis.  GU: No history of frequency, dysuria, or kidney stones.  REVIEW OF HISTORY:  Therefore, [name] is a 33 year old, white female with signs and symptoms of gallbladder disease secondary to her cholelithiasis, and we will plan for laparoscopic cholecystectomy via the outpatient department.  In the interim, she has been placed on a restrictive diet.  We discussed complications not limited to, but including bleeding, infection, damage to bile ducts, perforation of organs, transitory diarrhea, and the possibility of open surgery might be required.  Informed consent was obtained.  We have made plans to operate on her  as soon as possible via the outpatient department, and she is aware of her restrictive diet.  She is told to contact us should she have any problems in the interim.  I am [facility].     Barbaraann BarthelWilliam Brach Birdsall, M.D.     WB/MEDQ  D:  07/10/2014  T:  07/11/2014  Job:  [id number]  cc:   [facility]

## 2014-07-14 NOTE — Progress Notes (Signed)
UR chart review completed.  

## 2015-09-22 ENCOUNTER — Other Ambulatory Visit: Payer: Self-pay | Admitting: Physician Assistant

## 2015-09-22 LAB — CBC
HCT: 38.6 % (ref 36.0–46.0)
Hemoglobin: 13.1 g/dL (ref 12.0–15.0)
MCH: 32.2 pg (ref 26.0–34.0)
MCHC: 33.9 g/dL (ref 30.0–36.0)
MCV: 94.8 fL (ref 78.0–100.0)
MPV: 10 fL (ref 8.6–12.4)
PLATELETS: 303 10*3/uL (ref 150–400)
RBC: 4.07 MIL/uL (ref 3.87–5.11)
RDW: 13.7 % (ref 11.5–15.5)
WBC: 6.4 10*3/uL (ref 4.0–10.5)

## 2015-09-22 LAB — COMPREHENSIVE METABOLIC PANEL
ALT: 17 U/L (ref 6–29)
AST: 19 U/L (ref 10–30)
Albumin: 4.3 g/dL (ref 3.6–5.1)
Alkaline Phosphatase: 31 U/L — ABNORMAL LOW (ref 33–115)
BUN: 11 mg/dL (ref 7–25)
CO2: 23 mmol/L (ref 20–31)
Calcium: 9.5 mg/dL (ref 8.6–10.2)
Chloride: 109 mmol/L (ref 98–110)
Creat: 0.93 mg/dL (ref 0.50–1.10)
Glucose, Bld: 85 mg/dL (ref 65–99)
Potassium: 4 mmol/L (ref 3.5–5.3)
SODIUM: 138 mmol/L (ref 135–146)
Total Bilirubin: 0.3 mg/dL (ref 0.2–1.2)
Total Protein: 6.8 g/dL (ref 6.1–8.1)

## 2015-09-22 LAB — LIPID PANEL
Cholesterol: 156 mg/dL (ref 125–200)
HDL: 31 mg/dL — ABNORMAL LOW (ref >=46)
LDL Cholesterol: 94 mg/dL (ref <130)
TRIGLYCERIDES: 155 mg/dL — AB (ref <150)
Total CHOL/HDL Ratio: 5 Ratio (ref <=5.0)
VLDL: 31 mg/dL — ABNORMAL HIGH (ref <30)

## 2015-09-27 ENCOUNTER — Ambulatory Visit: Payer: Self-pay | Admitting: Physician Assistant

## 2015-09-27 ENCOUNTER — Encounter: Payer: Self-pay | Admitting: Physician Assistant

## 2015-09-27 VITALS — BP 114/62 | HR 70 | Temp 98.1°F | Ht 65.5 in | Wt 207.8 lb

## 2015-09-27 DIAGNOSIS — F17210 Nicotine dependence, cigarettes, uncomplicated: Secondary | ICD-10-CM | POA: Insufficient documentation

## 2015-09-27 DIAGNOSIS — E785 Hyperlipidemia, unspecified: Secondary | ICD-10-CM

## 2015-09-27 DIAGNOSIS — E669 Obesity, unspecified: Secondary | ICD-10-CM

## 2015-09-27 NOTE — Patient Instructions (Signed)
Smoking Cessation, Tips for Success If you are ready to quit smoking, congratulations! You have chosen to help yourself be healthier. Cigarettes bring nicotine, tar, carbon monoxide, and other irritants into your body. Your lungs, heart, and blood vessels will be able to work better without these poisons. There are many different ways to quit smoking. Nicotine gum, nicotine patches, a nicotine inhaler, or nicotine nasal spray can help with physical craving. Hypnosis, support groups, and medicines help break the habit of smoking. WHAT THINGS CAN I DO TO MAKE QUITTING EASIER?  Here are some tips to help you quit for good:  Pick a date when you will quit smoking completely. Tell all of your friends and family about your plan to quit on that date.  Do not try to slowly cut down on the number of cigarettes you are smoking. Pick a quit date and quit smoking completely starting on that day.  Throw away all cigarettes.   Clean and remove all ashtrays from your home, work, and car.  On a card, write down your reasons for quitting. Carry the card with you and read it when you get the urge to smoke.  Cleanse your body of nicotine. Drink enough water and fluids to keep your urine clear or pale yellow. Do this after quitting to flush the nicotine from your body.  Learn to predict your moods. Do not let a bad situation be your excuse to have a cigarette. Some situations in your life might tempt you into wanting a cigarette.  Never have "just one" cigarette. It leads to wanting another and another. Remind yourself of your decision to quit.  Change habits associated with smoking. If you smoked while driving or when feeling stressed, try other activities to replace smoking. Stand up when drinking your coffee. Brush your teeth after eating. Sit in a different chair when you read the paper. Avoid alcohol while trying to quit, and try to drink fewer caffeinated beverages. Alcohol and caffeine may urge you to  smoke.  Avoid foods and drinks that can trigger a desire to smoke, such as sugary or spicy foods and alcohol.  Ask people who smoke not to smoke around you.  Have something planned to do right after eating or having a cup of coffee. For example, plan to take a walk or exercise.  Try a relaxation exercise to calm you down and decrease your stress. Remember, you may be tense and nervous for the first 2 weeks after you quit, but this will pass.  Find new activities to keep your hands busy. Play with a pen, coin, or rubber band. Doodle or draw things on paper.  Brush your teeth right after eating. This will help cut down on the craving for the taste of tobacco after meals. You can also try mouthwash.   Use oral substitutes in place of cigarettes. Try using lemon drops, carrots, cinnamon sticks, or chewing gum. Keep them handy so they are available when you have the urge to smoke.  When you have the urge to smoke, try deep breathing.  Designate your home as a nonsmoking area.  If you are a heavy smoker, ask your health care provider about a prescription for nicotine chewing gum. It can ease your withdrawal from nicotine.  Reward yourself. Set aside the cigarette money you save and buy yourself something nice.  Look for support from others. Join a support group or smoking cessation program. Ask someone at home or at work to help you with your plan   to quit smoking.  Always ask yourself, "Do I need this cigarette or is this just a reflex?" Tell yourself, "Today, I choose not to smoke," or "I do not want to smoke." You are reminding yourself of your decision to quit.  Do not replace cigarette smoking with electronic cigarettes (commonly called e-cigarettes). The safety of e-cigarettes is unknown, and some may contain harmful chemicals.  If you relapse, do not give up! Plan ahead and think about what you will do the next time you get the urge to smoke. HOW WILL I FEEL WHEN I QUIT SMOKING? You  may have symptoms of withdrawal because your body is used to nicotine (the addictive substance in cigarettes). You may crave cigarettes, be irritable, feel very hungry, cough often, get headaches, or have difficulty concentrating. The withdrawal symptoms are only temporary. They are strongest when you first quit but will go away within 10-14 days. When withdrawal symptoms occur, stay in control. Think about your reasons for quitting. Remind yourself that these are signs that your body is healing and getting used to being without cigarettes. Remember that withdrawal symptoms are easier to treat than the major diseases that smoking can cause.  Even after the withdrawal is over, expect periodic urges to smoke. However, these cravings are generally short lived and will go away whether you smoke or not. Do not smoke! WHAT RESOURCES ARE AVAILABLE TO HELP ME QUIT SMOKING? Your health care provider can direct you to community resources or hospitals for support, which may include:  Group support.  Education.  Hypnosis.  Therapy.   This information is not intended to replace advice given to you by your health care provider. Make sure you discuss any questions you have with your health care provider.   Document Released: 06/27/2004 Document Revised: 10/20/2014 Document Reviewed: 03/17/2013 Else[employer] Interactive Patient Education 2016 [employer]  

## 2015-09-27 NOTE — Progress Notes (Signed)
BP 114/62 mmHg  Pulse 70  Temp(Src) 98.1 F (36.7 C)  Ht 5' 5.5" (1.664 m)  Wt 207 lb 12.8 oz (94.257 kg)  BMI 34.04 kg/m2  SpO2 96%   Subjective:    Patient ID: [name], female    DOB: 04/20/1981, 34 y.o.   MRN: [id number]  HPI: Jocelyn LargeMelissa A Withem is a 34 y.o. female presenting on 09/27/2015 for Hyperlipidemia   HPI  Pt is feeling well.  She is without complaint.   Relevant past medical, surgical, family and social history reviewed and updated as indicated. Interim medical history since our last visit reviewed. Allergies and medications reviewed and updated.   Current outpatient prescriptions:  .  fenofibrate micronized (LOFIBRA) 134 MG capsule, Take 134 mg by mouth daily., Disp: , Rfl:  .  Omega-3 Fatty Acids (FISH OIL) 1200 MG CAPS, Take 4 capsules by mouth every morning., Disp: , Rfl:  .  simvastatin (ZOCOR) 20 MG tablet, Take 20 mg by mouth daily., Disp: , Rfl:   Review of Systems  Constitutional: Negative for fever, chills, diaphoresis, appetite change, fatigue and unexpected weight change.  HENT: Positive for dental problem. Negative for congestion, drooling, ear pain, facial swelling, hearing loss, mouth sores, sneezing, sore throat, trouble swallowing and voice change.   Eyes: Negative for pain, discharge, redness, itching and visual disturbance.  Respiratory: Negative for cough, choking, shortness of breath and wheezing.   Cardiovascular: Negative for chest pain, palpitations and leg swelling.  Gastrointestinal: Negative for vomiting, abdominal pain, diarrhea, constipation and blood in stool.  Endocrine: Negative for cold intolerance, heat intolerance and polydipsia.  Genitourinary: Negative for dysuria, hematuria and decreased urine volume.  Musculoskeletal: Negative for back pain, arthralgias and gait problem.  Skin: Negative for rash.  Allergic/Immunologic: Negative for environmental allergies.  Neurological: Negative for seizures, syncope, light-headedness  and headaches.  Hematological: Negative for adenopathy.  Psychiatric/Behavioral: Negative for suicidal ideas, dysphoric mood and agitation. The patient is not nervous/anxious.     Per HPI unless specifically indicated above     Objective:    BP 114/62 mmHg  Pulse 70  Temp(Src) 98.1 F (36.7 C)  Ht 5' 5.5" (1.664 m)  Wt 207 lb 12.8 oz (94.257 kg)  BMI 34.04 kg/m2  SpO2 96%  Wt Readings from Last 3 Encounters:  09/27/15 207 lb 12.8 oz (94.257 kg)  07/13/14 198 lb (89.812 kg)  07/11/14 199 lb 3.2 oz (90.357 kg)    Physical Exam  Constitutional: She is oriented to person, place, and time. She appears well-developed and well-nourished.  HENT:  Head: Normocephalic and atraumatic.  Neck: Neck supple.  Cardiovascular: Normal rate and regular rhythm.   Pulmonary/Chest: Effort normal and breath sounds normal.  Abdominal: Soft. Bowel sounds are normal. She exhibits no mass. There is no tenderness.  Musculoskeletal: She exhibits no edema.  Lymphadenopathy:    She has no cervical adenopathy.  Neurological: She is alert and oriented to person, place, and time.  Skin: Skin is warm and dry.  Psychiatric: She has a normal mood and affect. Her behavior is normal.  Vitals reviewed.   Results for orders placed or performed in visit on 09/22/15  CBC  Result Value Ref Range   WBC 6.4 4.0 - 10.5 K/uL   RBC 4.07 3.87 - 5.11 MIL/uL   Hemoglobin 13.1 12.0 - 15.0 g/dL   HCT 82.938.6 56.236.0 - 13.046.0 %   MCV 94.8 78.0 - 100.0 fL   MCH 32.2 26.0 - 34.0 pg  MCHC 33.9 30.0 - 36.0 g/dL   RDW 16.1 09.6 - 04.5 %   Platelets 303 150 - 400 K/uL   MPV 10.0 8.6 - 12.4 fL  Comprehensive metabolic panel  Result Value Ref Range   Sodium 138 135 - 146 mmol/L   Potassium 4.0 3.5 - 5.3 mmol/L   Chloride 109 98 - 110 mmol/L   CO2 23 20 - 31 mmol/L   Glucose, Bld 85 65 - 99 mg/dL   BUN 11 7 - 25 mg/dL   Creat 4.09 8.11 - 9.14 mg/dL   Total Bilirubin 0.3 0.2 - 1.2 mg/dL   Alkaline Phosphatase 31 (L) 33 - 115  U/L   AST 19 10 - 30 U/L   ALT 17 6 - 29 U/L   Total Protein 6.8 6.1 - 8.1 g/dL   Albumin 4.3 3.6 - 5.1 g/dL   Calcium 9.5 8.6 - 78.2 mg/dL  Lipid panel  Result Value Ref Range   Cholesterol 156 125 - 200 mg/dL   Triglycerides 956 (H) <150 mg/dL   HDL 31 (L) >=21 mg/dL   Total CHOL/HDL Ratio 5.0 <=5.0 Ratio   VLDL 31 (H) <30 mg/dL   LDL Cholesterol 94 <308 mg/dL      Assessment & Plan:   Encounter Diagnoses  Name Primary?  . Hyperlipidemia Yes  . Cigarette nicotine dependence, uncomplicated   . Obesity, unspecified     -reviewed labs with pt. Will continue current Rx and lowfat diet -counseled on smoking cessation -f/u 6 months with PAP at that appt. rto sooner prn

## 2015-11-15 ENCOUNTER — Other Ambulatory Visit: Payer: Self-pay | Admitting: Physician Assistant

## 2015-11-24 DIAGNOSIS — M722 Plantar fascial fibromatosis: Secondary | ICD-10-CM | POA: Insufficient documentation

## 2015-11-24 DIAGNOSIS — Z79899 Other long term (current) drug therapy: Secondary | ICD-10-CM | POA: Insufficient documentation

## 2015-11-24 DIAGNOSIS — F17210 Nicotine dependence, cigarettes, uncomplicated: Secondary | ICD-10-CM | POA: Insufficient documentation

## 2015-11-24 DIAGNOSIS — E7800 Pure hypercholesterolemia, unspecified: Secondary | ICD-10-CM | POA: Insufficient documentation

## 2015-11-25 ENCOUNTER — Emergency Department (HOSPITAL_COMMUNITY): Payer: Self-pay

## 2015-11-25 ENCOUNTER — Emergency Department (HOSPITAL_COMMUNITY)
Admission: EM | Admit: 2015-11-25 | Discharge: 2015-11-25 | Disposition: A | Discharge status: Home / Self Care | Payer: Self-pay | Attending: Emergency Medicine | Admitting: Emergency Medicine

## 2015-11-25 ENCOUNTER — Encounter (HOSPITAL_COMMUNITY): Payer: Self-pay | Admitting: *Deleted

## 2015-11-25 DIAGNOSIS — M722 Plantar fascial fibromatosis: Secondary | ICD-10-CM

## 2015-11-25 MED ORDER — NAPROXEN 500 MG PO TABS: 500.0000 mg | ORAL_TABLET | Freq: Two times a day (BID) | ORAL | Status: DC

## 2015-11-25 MED ORDER — HYDROCODONE-ACETAMINOPHEN 5-325 MG PO TABS: 1.0000 | ORAL_TABLET | ORAL | Status: DC | PRN

## 2015-11-25 NOTE — ED Notes (Signed)
Pt c/o left foot pain that started today, denies any injury,

## 2015-11-25 NOTE — ED Provider Notes (Signed)
CSN: [id number]     Arrival date & time 11/24/15  2349 History   None    Chief Complaint  Patient presents with  . Foot Pain     (Consider location/radiation/quality/duration/timing/severity/associated sxs/prior Treatment) Patient is a 35 y.o. female presenting with lower extremity pain. The history is provided by the patient.  Foot Pain This is a new problem. The current episode started today. The problem occurs constantly. The problem has been gradually worsening. Associated symptoms include arthralgias.   [name] is a 35 y.o. female who presents to the ED with left foot pain that started today. [name] does not remember any injury to the area. [name] reports wearing tennis shoes all day before the pain started.   Past Medical History  Diagnosis Date  . Hypercholesteremia    Past Surgical History  Procedure Laterality Date  . Tubal ligation    . Cholecystectomy N/A 07/13/2014    Procedure: LAPAROSCOPIC CHOLECYSTECTOMY;  Surgeon: Marlane Hatcher, MD;  Location: [facility];  Service: General;  Laterality: N/A;   Family History  Problem Relation Age of Onset  . Heart disease Mother   . Diabetes Mother   . Heart disease Father   . Hypertension Father   . Thyroid disease Father   . Hyperlipidemia Father    Social History  Substance Use Topics  . Smoking status: Current Every Day Smoker -- 0.50 packs/day for 15 years    Types: Cigarettes  . Smokeless tobacco: Never Used  . Alcohol Use: No   OB History    No data available     Review of Systems  Musculoskeletal: Positive for arthralgias.       Left foot pain  all other systems negative    Allergies  Review of patient's allergies indicates no known allergies.  Home Medications   Prior to Admission medications   Medication Sig Start Date End Date Taking? Authorizing Provider  fenofibrate micronized (LOFIBRA) 134 MG capsule TAKE 1 CAPSULE BY MOUTH EVERY DAY FOR CHOLESTEROL 11/15/15  Yes Jacquelin Hawking, PA-C  Omega-3  Fatty Acids (FISH OIL) 1200 MG CAPS Take 4 capsules by mouth every morning.   Yes Historical Provider, MD  simvastatin (ZOCOR) 20 MG tablet Take 20 mg by mouth daily.   Yes Historical Provider, MD  HYDROcodone-acetaminophen (NORCO/VICODIN) 5-325 MG tablet Take 1 tablet by mouth every 4 (four) hours as needed. 11/25/15   Michiko Lineman Orlene Och, NP  naproxen (NAPROSYN) 500 MG tablet Take 1 tablet (500 mg total) by mouth 2 (two) times daily. 11/25/15   Columbia Pandey Orlene Och, NP   BP 119/70 mmHg  Pulse 70  Temp(Src) 98.6 F (37 C) (Oral)  Resp 20  Ht  (1.676 m)  Wt 86.183 kg  BMI 30.68 kg/m2  SpO2 98%  LMP 11/14/2015 Physical Exam  Constitutional: [name] is oriented to person, place, and time. [name] appears well-developed and well-nourished. No distress.  HENT:  Head: Normocephalic.  Eyes: EOM are normal.  Neck: Neck supple.  Cardiovascular: Normal rate.   Pulmonary/Chest: Effort normal.  Musculoskeletal: Normal range of motion.       Left foot: There is tenderness. There is normal range of motion, normal capillary refill and no deformity. Swelling: mild.       Feet:  Tender with palpation of the plantar aspect of the heel extending to the arch of the foot. Pedal pulses 2+, adequate circulation.   Neurological: [name] is alert and oriented to person, place, and time. No cranial nerve deficit.  Skin: Skin is warm and dry.  Psychiatric: [name] has a normal mood and affect. Her behavior is normal.  Nursing note and vitals reviewed.   ED Course  Procedures (including critical care time) Labs Review Labs Reviewed - No data to display  Imaging Review Dg Foot Complete Left  11/25/2015  CLINICAL DATA:  LEFT medial foot in arch pain for 1 day, no injury. EXAM: LEFT FOOT - COMPLETE 3+ VIEW COMPARISON:  None. FINDINGS: There is no evidence of fracture or dislocation. Small plantar calcaneal spur. There is no evidence of arthropathy or other focal bone abnormality. Os naviculare. Mild midfoot soft tissue swelling  without subcutaneous gas or radiopaque foreign bodies. IMPRESSION: Midfoot soft tissue swelling without acute osseous process. Electronically Signed   By: Awilda Metro M.D.   On: 11/25/2015 00:40    MDM  35 y.o. female with left foot pain without known injury stable for d/c without focal neuro deficits. Will treat for plantar fasciitis. Discussed with the patient and all questioned fully answered. [name]  Return if any problems arise.   Final diagnoses:  Plantar fasciitis      Janne Napoleon, NP 11/25/15 1610  Pricilla Loveless, MD 11/25/15 [phone]

## 2015-12-17 ENCOUNTER — Ambulatory Visit: Payer: Self-pay | Admitting: Physician Assistant

## 2015-12-17 ENCOUNTER — Encounter: Payer: Self-pay | Admitting: Physician Assistant

## 2015-12-17 VITALS — BP 112/66 | HR 71 | Temp 97.9°F | Ht 65.5 in | Wt 219.0 lb

## 2015-12-17 DIAGNOSIS — J069 Acute upper respiratory infection, unspecified: Secondary | ICD-10-CM

## 2015-12-17 MED ORDER — PREDNISONE 10 MG PO TABS: ORAL_TABLET | ORAL | Status: DC

## 2015-12-17 MED ORDER — BENZONATATE 100 MG PO CAPS: ORAL_CAPSULE | ORAL | Status: DC

## 2015-12-17 NOTE — Patient Instructions (Signed)
Upper Respiratory Infection, Adult Most upper respiratory infections (URIs) are a viral infection of the air passages leading to the lungs. A URI affects the nose, throat, and upper air passages. The most common type of URI is nasopharyngitis and is typically referred to as "the common cold." URIs run their course and usually go away on their own. Most of the time, a URI does not require medical attention, but sometimes a bacterial infection in the upper airways can follow a viral infection. This is called a secondary infection. Sinus and middle ear infections are common types of secondary upper respiratory infections. Bacterial pneumonia can also complicate a URI. A URI can worsen asthma and chronic obstructive pulmonary disease (COPD). Sometimes, these complications can require emergency medical care and may be life threatening.  CAUSES Almost all URIs are caused by viruses. A virus is a type of germ and can spread from one person to another.  RISKS FACTORS You may be at risk for a URI if:   You smoke.   You have chronic heart or lung disease.  You have a weakened defense (immune) system.   You are very young or very old.   You have nasal allergies or asthma.  You work in crowded or poorly ventilated areas.  You work in health care facilities or schools. SIGNS AND SYMPTOMS  Symptoms typically develop 2-3 days after you come in contact with a cold virus. Most viral URIs last 7-10 days. However, viral URIs from the influenza virus (flu virus) can last 14-18 days and are typically more severe. Symptoms may include:   Runny or stuffy (congested) nose.   Sneezing.   Cough.   Sore throat.   Headache.   Fatigue.   Fever.   Loss of appetite.   Pain in your forehead, behind your eyes, and over your cheekbones (sinus pain).  Muscle aches.  DIAGNOSIS  Your health care provider may diagnose a URI by:  Physical exam.  Tests to check that your symptoms are not due to  another condition such as:  Strep throat.  Sinusitis.  Pneumonia.  Asthma. TREATMENT  A URI goes away on its own with time. It cannot be cured with medicines, but medicines may be prescribed or recommended to relieve symptoms. Medicines may help:  Reduce your fever.  Reduce your cough.  Relieve nasal congestion. HOME CARE INSTRUCTIONS   Take medicines only as directed by your health care provider.   Gargle warm saltwater or take cough drops to comfort your throat as directed by your health care provider.  Use a warm mist humidifier or inhale steam from a shower to increase air moisture. This may make it easier to breathe.  Drink enough fluid to keep your urine clear or pale yellow.   Eat soups and other clear broths and maintain good nutrition.   Rest as needed.   Return to work when your temperature has returned to normal or as your health care provider advises. You may need to stay home longer to avoid infecting others. You can also use a face mask and careful hand washing to prevent spread of the virus.  Increase the usage of your inhaler if you have asthma.   Do not use any tobacco products, including cigarettes, chewing tobacco, or electronic cigarettes. If you need help quitting, ask your health care provider. PREVENTION  The best way to protect yourself from getting a cold is to practice good hygiene.   Avoid oral or hand contact with people with cold   symptoms.   Wash your hands often if contact occurs.  There is no clear evidence that vitamin C, vitamin E, echinacea, or exercise reduces the chance of developing a cold. However, it is always recommended to get plenty of rest, exercise, and practice good nutrition.  SEEK MEDICAL CARE IF:   You are getting worse rather than better.   Your symptoms are not controlled by medicine.   You have chills.  You have worsening shortness of breath.  You have brown or red mucus.  You have yellow or brown nasal  discharge.  You have pain in your face, especially when you bend forward.  You have a fever.  You have swollen neck glands.  You have pain while swallowing.  You have white areas in the back of your throat. SEEK IMMEDIATE MEDICAL CARE IF:   You have severe or persistent:  Headache.  Ear pain.  Sinus pain.  Chest pain.  You have chronic lung disease and any of the following:  Wheezing.  Prolonged cough.  Coughing up blood.  A change in your usual mucus.  You have a stiff neck.  You have changes in your:  Vision.  Hearing.  Thinking.  Mood. MAKE SURE YOU:   Understand these instructions.  Will watch your condition.  Will get help right away if you are not doing well or get worse.   This information is not intended to replace advice given to you by your health care provider. Make sure you discuss any questions you have with your health care provider.   Document Released: 03/25/2001 Document Revised: 02/13/2015 Document Reviewed: 01/04/2014 Elsevier Interactive Patient Education 2016 [employer]  

## 2015-12-17 NOTE — Progress Notes (Signed)
BP 112/66 mmHg  Pulse 71  Temp(Src) 97.9 F (36.6 C)  Ht 5' 5.5" (1.664 m)  Wt 219 lb (99.338 kg)  BMI 35.88 kg/m2  SpO2 99%  LMP 11/14/2015   Subjective:    Patient ID: [name], female    DOB: 07/07/1981, 35 y.o.   MRN: [id number]  HPI: [name] is a 35 y.o. female presenting on 12/17/2015 for Nasal Congestion  . Chief Complaint  Patient presents with  . Nasal Congestion    has had it since last wednesday, has tried mucinex and tylenol cold and flu and did not feel it was effective.     Cough The current episode started in the past 7 days. The problem has been unchanged. The cough is non-productive. Associated symptoms include ear congestion, headaches, nasal congestion, rhinorrhea, a sore throat, shortness of breath and wheezing. Pertinent negatives include no chest pain, chills, ear pain, eye redness, fever or rash. Risk factors for lung disease include smoking/tobacco exposure. Treatments tried: mucinex, tylenol cold. The treatment provided no relief. There is no history of environmental allergies.    Relevant past medical, surgical, family and social history reviewed and updated as indicated. Interim medical history since our last visit reviewed. Allergies and medications reviewed and updated.   Current outpatient prescriptions:  .  fenofibrate micronized (LOFIBRA) 134 MG capsule, TAKE 1 CAPSULE BY MOUTH EVERY DAY FOR CHOLESTEROL, Disp: 30 capsule, Rfl: 4 .  naproxen (NAPROSYN) 500 MG tablet, Take 1 tablet (500 mg total) by mouth 2 (two) times daily., Disp: 20 tablet, Rfl: 0 .  Omega-3 Fatty Acids (FISH OIL) 1200 MG CAPS, Take 4 capsules by mouth every morning., Disp: , Rfl:  .  simvastatin (ZOCOR) 20 MG tablet, Take 20 mg by mouth daily., Disp: , Rfl:    Review of Systems  Constitutional: Negative for fever, chills, diaphoresis, appetite change, fatigue and unexpected weight change.  HENT: Positive for congestion, rhinorrhea and sore throat. Negative for  dental problem, drooling, ear pain, facial swelling, hearing loss, mouth sores, sneezing, trouble swallowing and voice change.   Eyes: Positive for discharge. Negative for pain, redness, itching and visual disturbance.  Respiratory: Positive for cough, shortness of breath and wheezing. Negative for choking.   Cardiovascular: Negative for chest pain, palpitations and leg swelling.  Gastrointestinal: Negative for vomiting, abdominal pain, diarrhea, constipation and blood in stool.  Endocrine: Negative for cold intolerance, heat intolerance and polydipsia.  Genitourinary: Negative for dysuria, hematuria and decreased urine volume.  Musculoskeletal: Negative for back pain, arthralgias and gait problem.  Skin: Negative for rash.  Allergic/Immunologic: Negative for environmental allergies.  Neurological: Positive for headaches. Negative for seizures, syncope and light-headedness.  Hematological: Negative for adenopathy.  Psychiatric/Behavioral: Negative for suicidal ideas, dysphoric mood and agitation. The patient is not nervous/anxious.     Per HPI unless specifically indicated above     Objective:    BP 112/66 mmHg  Pulse 71  Temp(Src) 97.9 F (36.6 C)  Ht 5' 5.5" (1.664 m)  Wt 219 lb (99.338 kg)  BMI 35.88 kg/m2  SpO2 99%  LMP 11/14/2015  Wt Readings from Last 3 Encounters:  12/17/15 219 lb (99.338 kg)  11/25/15 190 lb (86.183 kg)  09/27/15 207 lb 12.8 oz (94.257 kg)    Physical Exam  Constitutional: She is oriented to person, place, and time. She appears well-developed and well-nourished.  HENT:  Head: Normocephalic and atraumatic.  Right Ear: Hearing, tympanic membrane, external ear and ear canal normal.  Left  Ear: Hearing, tympanic membrane, external ear and ear canal normal.  Nose: Mucosal edema and rhinorrhea present.  Mouth/Throat: Uvula is midline and oropharynx is clear and moist. No oropharyngeal exudate.  Neck: Neck supple.  Cardiovascular: Normal rate and regular  rhythm.   Pulmonary/Chest: Effort normal and breath sounds normal. She has no wheezes.  Lymphadenopathy:    She has no cervical adenopathy.  Neurological: She is alert and oriented to person, place, and time.  Skin: Skin is warm and dry.  Psychiatric: She has a normal mood and affect. Her behavior is normal.  Vitals reviewed.       Assessment & Plan:   Encounter Diagnosis  Name Primary?  . Acute upper respiratory infection Yes     rx prednisone and tessalon. Recommended otc  Mucinex. Counseled pt to avoid smoking, stopping altogether would be best.  F/u as scheduled.  RTO sooner prn

## 2016-03-13 ENCOUNTER — Other Ambulatory Visit: Payer: Self-pay | Admitting: Physician Assistant

## 2016-03-13 DIAGNOSIS — E785 Hyperlipidemia, unspecified: Secondary | ICD-10-CM

## 2016-03-26 ENCOUNTER — Other Ambulatory Visit: Payer: Self-pay | Admitting: Student

## 2016-03-26 DIAGNOSIS — E785 Hyperlipidemia, unspecified: Secondary | ICD-10-CM

## 2016-03-27 ENCOUNTER — Ambulatory Visit: Payer: Self-pay | Admitting: Physician Assistant

## 2016-03-31 LAB — LIPID PANEL
Cholesterol: 178 mg/dL (ref 125–200)
HDL: 30 mg/dL — ABNORMAL LOW (ref >=46)
LDL CALC: 102 mg/dL (ref <130)
Total CHOL/HDL Ratio: 5.9 Ratio — ABNORMAL HIGH (ref <=5.0)
Triglycerides: 228 mg/dL — ABNORMAL HIGH (ref <150)
VLDL: 46 mg/dL — AB (ref <30)

## 2016-03-31 LAB — COMPLETE METABOLIC PANEL WITH GFR
ALT: 17 U/L (ref 6–29)
AST: 15 U/L (ref 10–30)
Albumin: 4 g/dL (ref 3.6–5.1)
Alkaline Phosphatase: 30 U/L — ABNORMAL LOW (ref 33–115)
BUN: 12 mg/dL (ref 7–25)
CHLORIDE: 109 mmol/L (ref 98–110)
CO2: 24 mmol/L (ref 20–31)
Calcium: 9.2 mg/dL (ref 8.6–10.2)
Creat: 0.71 mg/dL (ref 0.50–1.10)
GFR, Est African American: >89 mL/min (ref >=60)
GFR, Est Non African American: >89 mL/min (ref >=60)
Glucose, Bld: 86 mg/dL (ref 65–99)
Potassium: 3.9 mmol/L (ref 3.5–5.3)
Sodium: 141 mmol/L (ref 135–146)
Total Bilirubin: 0.3 mg/dL (ref 0.2–1.2)
Total Protein: 6.5 g/dL (ref 6.1–8.1)

## 2016-04-02 ENCOUNTER — Encounter: Payer: Self-pay | Admitting: Physician Assistant

## 2016-04-02 ENCOUNTER — Ambulatory Visit: Payer: Self-pay | Admitting: Physician Assistant

## 2016-04-02 VITALS — BP 110/60 | HR 78 | Temp 97.9°F | Ht 65.5 in | Wt 199.4 lb

## 2016-04-02 DIAGNOSIS — E785 Hyperlipidemia, unspecified: Secondary | ICD-10-CM

## 2016-04-02 DIAGNOSIS — F17210 Nicotine dependence, cigarettes, uncomplicated: Secondary | ICD-10-CM

## 2016-04-02 DIAGNOSIS — E669 Obesity, unspecified: Secondary | ICD-10-CM

## 2016-04-02 NOTE — Progress Notes (Signed)
BP 110/60 mmHg  Pulse 78  Temp(Src) 97.9 F (36.6 C)  Ht 5' 5.5" (1.664 m)  Wt 199 lb 6.4 oz (90.447 kg)  BMI 32.67 kg/m2  SpO2 98%  LMP 03/31/2016   Subjective:    Patient ID: [name], female    DOB: 03/19/1981, 35 y.o.   MRN: [id number]  HPI: [name] is a 35 y.o. female presenting on 04/02/2016 for Hyperlipidemia   HPI   Pt supposed to get PAP today but she is on menses.  She is still smoking.  Pt is doing well.   Relevant past medical, surgical, family and social history reviewed and updated as indicated. Interim medical history since our last visit reviewed. Allergies and medications reviewed and updated.   Current outpatient prescriptions:  .  fenofibrate micronized (LOFIBRA) 134 MG capsule, TAKE 1 CAPSULE BY MOUTH EVERY DAY FOR CHOLESTEROL, Disp: 30 capsule, Rfl: 4 .  naproxen (NAPROSYN) 500 MG tablet, Take 1 tablet (500 mg total) by mouth 2 (two) times daily. (Patient taking differently: Take 500 mg by mouth 2 (two) times daily as needed. ), Disp: 20 tablet, Rfl: 0 .  Omega-3 Fatty Acids (FISH OIL) 1200 MG CAPS, Take 4 capsules by mouth every morning., Disp: , Rfl:  .  simvastatin (ZOCOR) 20 MG tablet, Take 20 mg by mouth daily., Disp: , Rfl:   Review of Systems  Constitutional: Negative for fever, chills, diaphoresis, appetite change, fatigue and unexpected weight change.  HENT: Positive for dental problem. Negative for congestion, drooling, ear pain, facial swelling, hearing loss, mouth sores, sneezing, sore throat, trouble swallowing and voice change.   Eyes: Negative for pain, discharge, redness, itching and visual disturbance.  Respiratory: Negative for cough, choking, shortness of breath and wheezing.   Cardiovascular: Negative for chest pain, palpitations and leg swelling.  Gastrointestinal: Negative for vomiting, abdominal pain, diarrhea, constipation and blood in stool.  Endocrine: Negative for cold intolerance, heat intolerance and  polydipsia.  Genitourinary: Negative for dysuria, hematuria and decreased urine volume.  Musculoskeletal: Negative for back pain, arthralgias and gait problem.  Skin: Negative for rash.  Allergic/Immunologic: Negative for environmental allergies.  Neurological: Negative for seizures, syncope, light-headedness and headaches.  Hematological: Negative for adenopathy.  Psychiatric/Behavioral: Negative for suicidal ideas, dysphoric mood and agitation. The patient is not nervous/anxious.     Per HPI unless specifically indicated above     Objective:    BP 110/60 mmHg  Pulse 78  Temp(Src) 97.9 F (36.6 C)  Ht 5' 5.5" (1.664 m)  Wt 199 lb 6.4 oz (90.447 kg)  BMI 32.67 kg/m2  SpO2 98%  LMP 03/31/2016  Wt Readings from Last 3 Encounters:  04/02/16 199 lb 6.4 oz (90.447 kg)  12/17/15 219 lb (99.338 kg)  11/25/15 190 lb (86.183 kg)    Physical Exam  Constitutional: She is oriented to person, place, and time. She appears well-developed and well-nourished.  HENT:  Head: Normocephalic and atraumatic.  Neck: Neck supple.  Cardiovascular: Normal rate and regular rhythm.   Pulmonary/Chest: Effort normal and breath sounds normal.  Abdominal: Soft. Bowel sounds are normal. She exhibits no mass. There is no hepatosplenomegaly. There is no tenderness.  Musculoskeletal: She exhibits no edema.  Lymphadenopathy:    She has no cervical adenopathy.  Neurological: She is alert and oriented to person, place, and time.  Skin: Skin is warm and dry.  Psychiatric: She has a normal mood and affect. Her behavior is normal.  Vitals reviewed.   Results for orders  placed or performed in visit on 03/26/16  COMPLETE METABOLIC PANEL WITH GFR  Result Value Ref Range   Sodium 141 135 - 146 mmol/L   Potassium 3.9 3.5 - 5.3 mmol/L   Chloride 109 98 - 110 mmol/L   CO2 24 20 - 31 mmol/L   Glucose, Bld 86 65 - 99 mg/dL   BUN 12 7 - 25 mg/dL   Creat 2.95 6.21 - 3.08 mg/dL   Total Bilirubin 0.3 0.2 - 1.2 mg/dL    Alkaline Phosphatase 30 (L) 33 - 115 U/L   AST 15 10 - 30 U/L   ALT 17 6 - 29 U/L   Total Protein 6.5 6.1 - 8.1 g/dL   Albumin 4.0 3.6 - 5.1 g/dL   Calcium 9.2 8.6 - 65.7 mg/dL   GFR, Est African American >89 >=60 mL/min   GFR, Est Non African American >89 >=60 mL/min  Lipid Profile  Result Value Ref Range   Cholesterol 178 125 - 200 mg/dL   Triglycerides 846 (H) <150 mg/dL   HDL 30 (L) >=96 mg/dL   Total CHOL/HDL Ratio 5.9 (H) <=5.0 Ratio   VLDL 46 (H) <30 mg/dL   LDL Cholesterol 295 <284 mg/dL      Assessment & Plan:   Encounter Diagnoses  Name Primary?  . Hyperlipidemia Yes  . Cigarette nicotine dependence, uncomplicated   . Obesity, unspecified     -reviewed labs with pt -continue current Rx -counseled smoking cessation -f/u 3 months.  Will do PAP at that appointment. RTO sooner prn

## 2016-04-21 ENCOUNTER — Other Ambulatory Visit: Payer: Self-pay | Admitting: Physician Assistant

## 2016-04-21 MED ORDER — SIMVASTATIN 20 MG PO TABS: 20.0000 mg | ORAL_TABLET | Freq: Every day | ORAL | Status: DC

## 2016-04-24 ENCOUNTER — Emergency Department (HOSPITAL_COMMUNITY)
Admission: EM | Admit: 2016-04-24 | Discharge: 2016-04-24 | Disposition: A | Discharge status: Home / Self Care | Payer: Medicaid Other | Attending: Emergency Medicine | Admitting: Emergency Medicine

## 2016-04-24 ENCOUNTER — Encounter (HOSPITAL_COMMUNITY): Payer: Self-pay

## 2016-04-24 DIAGNOSIS — Y929 Unspecified place or not applicable: Secondary | ICD-10-CM | POA: Insufficient documentation

## 2016-04-24 DIAGNOSIS — S7012XA Contusion of left thigh, initial encounter: Secondary | ICD-10-CM | POA: Insufficient documentation

## 2016-04-24 DIAGNOSIS — F17210 Nicotine dependence, cigarettes, uncomplicated: Secondary | ICD-10-CM | POA: Insufficient documentation

## 2016-04-24 DIAGNOSIS — X58XXXA Exposure to other specified factors, initial encounter: Secondary | ICD-10-CM | POA: Insufficient documentation

## 2016-04-24 DIAGNOSIS — Y999 Unspecified external cause status: Secondary | ICD-10-CM | POA: Insufficient documentation

## 2016-04-24 DIAGNOSIS — Z79899 Other long term (current) drug therapy: Secondary | ICD-10-CM | POA: Insufficient documentation

## 2016-04-24 DIAGNOSIS — Y939 Activity, unspecified: Secondary | ICD-10-CM | POA: Insufficient documentation

## 2016-04-24 DIAGNOSIS — T148XXA Other injury of unspecified body region, initial encounter: Secondary | ICD-10-CM

## 2016-04-24 MED ORDER — NAPROXEN 500 MG PO TABS: 500.0000 mg | ORAL_TABLET | Freq: Two times a day (BID) | ORAL | Status: DC

## 2016-04-24 MED ORDER — KETOROLAC TROMETHAMINE 30 MG/ML IJ SOLN
30.0000 mg | Freq: Once | INTRAMUSCULAR | Status: AC
  Administered 2016-04-24: 30 mg via INTRAMUSCULAR
  Filled 2016-04-24: qty 1

## 2016-04-24 NOTE — ED Notes (Signed)
Pt reports pain to the inside of her left thigh, states it started approx a week ago when she was reaching to put clothes away in a closet

## 2016-04-24 NOTE — Discharge Instructions (Signed)

## 2016-04-24 NOTE — ED Notes (Signed)
ACE wrap applied to left upper thigh

## 2016-04-24 NOTE — ED Provider Notes (Signed)
CSN: [id number]     Arrival date & time 04/24/16  0016 History   First MD Initiated Contact with Patient 04/24/16 0201     Chief Complaint  Patient presents with  . Leg Pain     (Consider location/radiation/quality/duration/timing/severity/associated sxs/prior Treatment) HPI  This is a 35 year old female who presents with left leg pain. Patient reports 10 day history of worsening left inner thigh pain. It is worse with certain movements. When she is still evident 0 out of 10. When she is moving or walking it is 10 out of 10. No radiation of the pain. She has noted some mild discoloration and swelling to the leg. She states "there is a knot there." She denies any injury that she knows of. She states "I just couldn't take it anymore."  She took Tylenol no relief. No history of blood clots.  Past Medical History  Diagnosis Date  . Hypercholesteremia    Past Surgical History  Procedure Laterality Date  . Tubal ligation    . Cholecystectomy N/A 07/13/2014    Procedure: LAPAROSCOPIC CHOLECYSTECTOMY;  Surgeon: Marlane HatcherWilliam S Bradford, MD;  Location: [facility];  Service: General;  Laterality: N/A;   Family History  Problem Relation Age of Onset  . Heart disease Mother   . Diabetes Mother   . Heart disease Father   . Hypertension Father   . Thyroid disease Father   . Hyperlipidemia Father    Social History  Substance Use Topics  . Smoking status: Current Every Day Smoker -- 0.50 packs/day for 15 years    Types: Cigarettes  . Smokeless tobacco: Never Used  . Alcohol Use: No   OB History    No data available     Review of Systems  Cardiovascular: Negative for leg swelling.  Musculoskeletal:       Left inner thigh pain  Skin: Positive for color change.  All other systems reviewed and are negative.     Allergies  Review of patient's allergies indicates no known allergies.  Home Medications   Prior to Admission medications   Medication Sig Start Date End Date Taking? Authorizing  Provider  fenofibrate micronized (LOFIBRA) 134 MG capsule TAKE 1 CAPSULE BY MOUTH EVERY DAY FOR CHOLESTEROL 11/15/15  Yes Jacquelin HawkingShannon McElroy, PA-C  Omega-3 Fatty Acids (FISH OIL) 1200 MG CAPS Take 4 capsules by mouth every morning.   Yes Historical Provider, MD  simvastatin (ZOCOR) 20 MG tablet Take 1 tablet (20 mg total) by mouth daily. 04/21/16  Yes Jacquelin HawkingShannon McElroy, PA-C  naproxen (NAPROSYN) 500 MG tablet Take 1 tablet (500 mg total) by mouth 2 (two) times daily. 04/24/16   Shon Batonourtney F Horton, MD   BP 121/62 mmHg  Pulse 63  Temp(Src) 98.4 F (36.9 C) (Oral)  Resp 16  Ht 5\' 4"  (1.626 m)  Wt 190 lb (86.183 kg)  BMI 32.60 kg/m2  SpO2 97%  LMP 03/31/2016 Physical Exam  Constitutional: She is oriented to person, place, and time. She appears well-developed and well-nourished.  HENT:  Head: Normocephalic and atraumatic.  Cardiovascular: Normal rate, regular rhythm and normal heart sounds.   Pulmonary/Chest: Effort normal. No respiratory distress. She has no wheezes.  Musculoskeletal:  Tenderness to palpation over the mid medial left thigh, mild bruising noted with minimal swelling, 2+ DP pulse, no leg swelling or tenderness to palpation of the calf  Neurological: She is alert and oriented to person, place, and time.  Skin: Skin is warm and dry.  Psychiatric: She has a normal mood and affect.  Nursing note and vitals reviewed.   ED Course  Procedures (including critical care time) Labs Review Labs Reviewed - No data to display  Imaging Review No results found. I have personally reviewed and evaluated these images and lab results as part of my medical decision-making.   EKG Interpretation None      MDM   Final diagnoses:  Contusion    Patient presents with pain to the left medial thigh. On physical exam, there is skin discoloration and bruising suggestive of contusion. She is relatively point tender without significant diffuse tenderness. Low suspicion at this time for DVT.  Discussed with patient rest, ice, compression, and anti-inflammatory medications. Discharge with naproxen twice a day.  After history, exam, and medical workup I feel the patient has been appropriately medically screened and is safe for discharge home. Pertinent diagnoses were discussed with the patient. Patient was given return precautions.     Shon Baton, MD 04/24/16 [phone]

## 2016-06-18 ENCOUNTER — Other Ambulatory Visit: Payer: Self-pay | Admitting: Physician Assistant

## 2016-06-23 ENCOUNTER — Other Ambulatory Visit: Payer: Self-pay

## 2016-06-23 DIAGNOSIS — E785 Hyperlipidemia, unspecified: Secondary | ICD-10-CM

## 2016-06-28 LAB — COMPLETE METABOLIC PANEL WITH GFR
ALT: 18 U/L (ref 6–29)
AST: 15 U/L (ref 10–30)
Albumin: 4.4 g/dL (ref 3.6–5.1)
Alkaline Phosphatase: 36 U/L (ref 33–115)
BILIRUBIN TOTAL: 0.2 mg/dL (ref 0.2–1.2)
BUN: 15 mg/dL (ref 7–25)
CHLORIDE: 110 mmol/L (ref 98–110)
CO2: 19 mmol/L — AB (ref 20–31)
CREATININE: 0.9 mg/dL (ref 0.50–1.10)
Calcium: 9.8 mg/dL (ref 8.6–10.2)
GFR, Est African American: >89 mL/min (ref >=60)
GFR, Est Non African American: 83 mL/min (ref >=60)
Glucose, Bld: 87 mg/dL (ref 65–99)
Potassium: 4.4 mmol/L (ref 3.5–5.3)
Sodium: 139 mmol/L (ref 135–146)
TOTAL PROTEIN: 6.9 g/dL (ref 6.1–8.1)

## 2016-06-28 LAB — LIPID PANEL
Cholesterol: 213 mg/dL — ABNORMAL HIGH (ref 125–200)
HDL: 32 mg/dL — ABNORMAL LOW (ref >=46)
LDL CALC: 135 mg/dL — AB (ref <130)
Total CHOL/HDL Ratio: 6.7 Ratio — ABNORMAL HIGH (ref <=5.0)
Triglycerides: 230 mg/dL — ABNORMAL HIGH (ref <150)
VLDL: 46 mg/dL — AB (ref <30)

## 2016-07-02 ENCOUNTER — Encounter: Payer: Self-pay | Admitting: Physician Assistant

## 2016-07-02 ENCOUNTER — Other Ambulatory Visit: Payer: Self-pay | Admitting: Physician Assistant

## 2016-07-02 ENCOUNTER — Ambulatory Visit: Payer: Self-pay | Admitting: Physician Assistant

## 2016-07-02 VITALS — BP 110/64 | HR 66 | Temp 97.9°F | Ht 65.5 in | Wt 200.0 lb

## 2016-07-02 DIAGNOSIS — E785 Hyperlipidemia, unspecified: Secondary | ICD-10-CM

## 2016-07-02 DIAGNOSIS — F17210 Nicotine dependence, cigarettes, uncomplicated: Secondary | ICD-10-CM

## 2016-07-02 DIAGNOSIS — Z01419 Encounter for gynecological examination (general) (routine) without abnormal findings: Secondary | ICD-10-CM

## 2016-07-02 DIAGNOSIS — E669 Obesity, unspecified: Secondary | ICD-10-CM

## 2016-07-02 NOTE — Progress Notes (Signed)
BP 110/64 (BP Location: Left Arm, Patient Position: Sitting, Cuff Size: Normal)   Pulse 66   Temp 97.9 F (36.6 C)   Ht 5' 5.5" (1.664 m)   Wt 200 lb (90.7 kg)   LMP 06/09/2016 (Within Days)   SpO2 99%   BMI 32.78 kg/m    Subjective:    Patient ID: [name], female    DOB: 11/08/1980, 35 y.o.   MRN: [id number]  HPI: [name] is a 35 y.o. female presenting on 07/02/2016 for Hyperlipidemia and Gynecologic Exam   HPI   Pt good- no complaints  Last pap 03/31/13.    LMP 06/09/16  Relevant past medical, surgical, family and social history reviewed and updated as indicated. Interim medical history since our last visit reviewed. Allergies and medications reviewed and updated.   Current Outpatient Prescriptions:  .  fenofibrate micronized (LOFIBRA) 134 MG capsule, TAKE 1 CAPSULE BY MOUTH EVERY DAY FOR CHOLESTEROL, Disp: 30 capsule, Rfl: 6 .  naproxen (NAPROSYN) 500 MG tablet, Take 1 tablet (500 mg total) by mouth 2 (two) times daily., Disp: 30 tablet, Rfl: 0 .  Omega-3 Fatty Acids (FISH OIL) 1200 MG CAPS, Take 4 capsules by mouth every morning., Disp: , Rfl:  .  simvastatin (ZOCOR) 20 MG tablet, Take 1 tablet (20 mg total) by mouth daily., Disp: 30 tablet, Rfl: 6   Review of Systems  Constitutional: Negative for appetite change, chills, diaphoresis, fatigue, fever and unexpected weight change.  HENT: Positive for dental problem. Negative for congestion, drooling, ear pain, facial swelling, hearing loss, mouth sores, sneezing, sore throat, trouble swallowing and voice change.   Eyes: Negative for pain, discharge, redness, itching and visual disturbance.  Respiratory: Negative for cough, choking, shortness of breath and wheezing.   Cardiovascular: Negative for chest pain, palpitations and leg swelling.  Gastrointestinal: Negative for abdominal pain, blood in stool, constipation, diarrhea and vomiting.  Endocrine: Negative for cold intolerance, heat intolerance and  polydipsia.  Genitourinary: Negative for decreased urine volume, dysuria and hematuria.  Musculoskeletal: Negative for arthralgias, back pain and gait problem.  Skin: Negative for rash.  Allergic/Immunologic: Negative for environmental allergies.  Neurological: Negative for seizures, syncope, light-headedness and headaches.  Hematological: Negative for adenopathy.  Psychiatric/Behavioral: Negative for agitation, dysphoric mood and suicidal ideas. The patient is not nervous/anxious.     Per HPI unless specifically indicated above     Objective:    BP 110/64 (BP Location: Left Arm, Patient Position: Sitting, Cuff Size: Normal)   Pulse 66   Temp 97.9 F (36.6 C)   Ht 5' 5.5" (1.664 m)   Wt 200 lb (90.7 kg)   LMP 06/09/2016 (Within Days)   SpO2 99%   BMI 32.78 kg/m   Wt Readings from Last 3 Encounters:  07/02/16 200 lb (90.7 kg)  04/24/16 190 lb (86.2 kg)  04/02/16 199 lb 6.4 oz (90.4 kg)    Physical Exam  Constitutional: She is oriented to person, place, and time. She appears well-developed and well-nourished.  HENT:  Head: Normocephalic and atraumatic.  Neck: Neck supple.  Cardiovascular: Normal rate and regular rhythm.   Pulmonary/Chest: Effort normal and breath sounds normal.  Abdominal: Soft. Bowel sounds are normal. She exhibits no mass. There is no hepatosplenomegaly. There is no tenderness. There is no rebound and no guarding.  Genitourinary: Vagina normal and uterus normal. No breast swelling, tenderness, discharge or bleeding. There is no rash, tenderness or lesion on the right labia. There is no rash, tenderness or  lesion on the left labia. Cervix exhibits no motion tenderness, no discharge and no friability. Right adnexum displays no mass, no tenderness and no fullness. Left adnexum displays no mass, no tenderness and no fullness.  Genitourinary Comments: (nurse Berenice assisted)  Musculoskeletal: She exhibits no edema.  Lymphadenopathy:    She has no cervical  adenopathy.  Neurological: She is alert and oriented to person, place, and time.  Skin: Skin is warm and dry.  Psychiatric: She has a normal mood and affect. Her behavior is normal.  Nursing note and vitals reviewed.   Results for orders placed or performed in visit on 06/23/16  COMPLETE METABOLIC PANEL WITH GFR  Result Value Ref Range   Sodium 139 135 - 146 mmol/L   Potassium 4.4 3.5 - 5.3 mmol/L   Chloride 110 98 - 110 mmol/L   CO2 19 (L) 20 - 31 mmol/L   Glucose, Bld 87 65 - 99 mg/dL   BUN 15 7 - 25 mg/dL   Creat 1.61 0.96 - 0.45 mg/dL   Total Bilirubin 0.2 0.2 - 1.2 mg/dL   Alkaline Phosphatase 36 33 - 115 U/L   AST 15 10 - 30 U/L   ALT 18 6 - 29 U/L   Total Protein 6.9 6.1 - 8.1 g/dL   Albumin 4.4 3.6 - 5.1 g/dL   Calcium 9.8 8.6 - 40.9 mg/dL   GFR, Est African American >89 >=60 mL/min   GFR, Est Non African American 83 >=60 mL/min  Lipid Profile  Result Value Ref Range   Cholesterol 213 (H) 125 - 200 mg/dL   Triglycerides 811 (H) <150 mg/dL   HDL 32 (L) >=91 mg/dL   Total CHOL/HDL Ratio 6.7 (H) <=5.0 Ratio   VLDL 46 (H) <30 mg/dL   LDL Cholesterol 478 (H) <130 mg/dL      Assessment & Plan:   Encounter Diagnoses  Name Primary?  . Hyperlipidemia Yes  . Obesity, unspecified   . Cigarette nicotine dependence, uncomplicated   . Pap smear, as part of routine gynecological examination     -reviewed labs with pt -continue current medications. Counseled to watch lowfat diet -counseled smoking cessation -will call with pap results -f/u 4 months. RTO sooner prn

## 2016-07-03 LAB — PAP, THINPREP RFLX HPV

## 2016-07-07 ENCOUNTER — Other Ambulatory Visit: Payer: Self-pay | Admitting: Physician Assistant

## 2016-07-07 DIAGNOSIS — B9689 Other specified bacterial agents as the cause of diseases classified elsewhere: Secondary | ICD-10-CM

## 2016-07-07 DIAGNOSIS — N760 Acute vaginitis: Principal | ICD-10-CM

## 2016-07-07 MED ORDER — METRONIDAZOLE 500 MG PO TABS: 500.0000 mg | ORAL_TABLET | Freq: Two times a day (BID) | ORAL | 0 refills | Status: AC

## 2016-09-09 ENCOUNTER — Ambulatory Visit: Payer: Self-pay | Admitting: Physician Assistant

## 2016-09-09 ENCOUNTER — Encounter: Payer: Self-pay | Admitting: Physician Assistant

## 2016-09-09 VITALS — BP 130/66 | HR 88 | Temp 97.7°F | Ht 65.5 in | Wt 203.0 lb

## 2016-09-09 DIAGNOSIS — F17218 Nicotine dependence, cigarettes, with other nicotine-induced disorders: Secondary | ICD-10-CM

## 2016-09-09 DIAGNOSIS — J209 Acute bronchitis, unspecified: Secondary | ICD-10-CM

## 2016-09-09 MED ORDER — PREDNISONE 10 MG PO TABS: ORAL_TABLET | ORAL | 0 refills | Status: AC

## 2016-09-09 MED ORDER — AZITHROMYCIN 250 MG PO TABS: ORAL_TABLET | ORAL | 0 refills | Status: AC

## 2016-09-09 MED ORDER — BENZONATATE 100 MG PO CAPS
ORAL_CAPSULE | ORAL | 3 refills | Status: DC
Start: 2016-09-09 — End: 2016-11-11

## 2016-09-09 NOTE — Progress Notes (Signed)
BP 130/66 (BP Location: Left Arm, Patient Position: Sitting, Cuff Size: Normal)   Pulse 88   Temp 97.7 F (36.5 C)   Ht 5' 5.5" (1.664 m)   Wt 203 lb (92.1 kg)   SpO2 97%   BMI 33.27 kg/m    Subjective:    Patient ID: [name], female    DOB: 04/18/1981, 35 y.o.   MRN: [id number]  HPI: [name] is a 35 y.o. female presenting on 09/09/2016 for Nasal Congestion (for 2 weeks. cough, green phelgm, little bit of wheezing, runny nose, sneezing, pt denies fever. pt has taken mucinex and tylenol and has not been helpful)   HPI   Chief Complaint  Patient presents with  . Nasal Congestion    for 2 weeks. cough, green phelgm, little bit of wheezing, runny nose, sneezing, pt denies fever. pt has taken mucinex and tylenol and has not been helpful   Intermittent earache.  Throat is sore.  Wheezing.  Pt is still smoking but says she has cut back since she has been sick.  No fever.  Relevant past medical, surgical, family and social history reviewed and updated as indicated. Interim medical history since our last visit reviewed. Allergies and medications reviewed and updated.   Current Outpatient Prescriptions:  .  fenofibrate micronized (LOFIBRA) 134 MG capsule, TAKE 1 CAPSULE BY MOUTH EVERY DAY FOR CHOLESTEROL, Disp: 30 capsule, Rfl: 6 .  naproxen (NAPROSYN) 500 MG tablet, Take 1 tablet (500 mg total) by mouth 2 (two) times daily., Disp: 30 tablet, Rfl: 0 .  Omega-3 Fatty Acids (FISH OIL) 1200 MG CAPS, Take 4 capsules by mouth every morning., Disp: , Rfl:  .  simvastatin (ZOCOR) 20 MG tablet, Take 1 tablet (20 mg total) by mouth daily., Disp: 30 tablet, Rfl: 6   Review of Systems  Constitutional: Positive for diaphoresis. Negative for appetite change, chills, fatigue, fever and unexpected weight change.  HENT: Positive for congestion, ear pain, sneezing, sore throat and voice change. Negative for dental problem, drooling, facial swelling, hearing loss, mouth sores and trouble  swallowing.   Eyes: Negative for pain, discharge, redness, itching and visual disturbance.  Respiratory: Positive for cough, shortness of breath and wheezing. Negative for choking.   Cardiovascular: Negative for chest pain, palpitations and leg swelling.  Gastrointestinal: Negative for abdominal pain, blood in stool, constipation, diarrhea and vomiting.  Endocrine: Negative for cold intolerance, heat intolerance and polydipsia.  Genitourinary: Negative for decreased urine volume, dysuria and hematuria.  Musculoskeletal: Negative for arthralgias, back pain and gait problem.  Skin: Negative for rash.  Allergic/Immunologic: Negative for environmental allergies.  Neurological: Positive for headaches. Negative for seizures, syncope and light-headedness.  Hematological: Negative for adenopathy.  Psychiatric/Behavioral: Negative for agitation, dysphoric mood and suicidal ideas. The patient is not nervous/anxious.     Per HPI unless specifically indicated above     Objective:    BP 130/66 (BP Location: Left Arm, Patient Position: Sitting, Cuff Size: Normal)   Pulse 88   Temp 97.7 F (36.5 C)   Ht 5' 5.5" (1.664 m)   Wt 203 lb (92.1 kg)   SpO2 97%   BMI 33.27 kg/m   Wt Readings from Last 3 Encounters:  09/09/16 203 lb (92.1 kg)  07/02/16 200 lb (90.7 kg)  04/24/16 190 lb (86.2 kg)    Physical Exam  Constitutional: She is oriented to person, place, and time. She appears well-developed and well-nourished.  HENT:  Head: Normocephalic and atraumatic.  Right Ear:  Hearing, tympanic membrane, external ear and ear canal normal.  Left Ear: Hearing, tympanic membrane, external ear and ear canal normal.  Nose: Nose normal.  Mouth/Throat: Uvula is midline and oropharynx is clear and moist. No oropharyngeal exudate.  Neck: Neck supple.  Cardiovascular: Normal rate and regular rhythm.   Pulmonary/Chest: Effort normal and breath sounds normal. She has no wheezes.  Lymphadenopathy:    She has no  cervical adenopathy.  Neurological: She is alert and oriented to person, place, and time.  Skin: Skin is warm and dry.  Psychiatric: She has a normal mood and affect. Her behavior is normal.  Vitals reviewed.       Assessment & Plan:   Encounter Diagnoses  Name Primary?  . Acute bronchitis, unspecified organism Yes  . Cigarette nicotine dependence with other nicotine-induced disorder      -counseled pt to avoid smoking -rx zpack, prednisone taper and tessalon -rest fluids. Gave handout. -F/u as scheduled.  RTO sooner prn

## 2016-09-09 NOTE — Patient Instructions (Signed)

## 2016-10-13 HISTORY — PX: DENTAL SURGERY: SHX609

## 2016-10-20 ENCOUNTER — Other Ambulatory Visit: Payer: Self-pay | Admitting: Student

## 2016-10-20 DIAGNOSIS — E785 Hyperlipidemia, unspecified: Secondary | ICD-10-CM

## 2016-10-25 LAB — COMPREHENSIVE METABOLIC PANEL
ALBUMIN: 4.2 g/dL (ref 3.6–5.1)
ALT: 15 U/L (ref 6–29)
AST: 17 U/L (ref 10–30)
Alkaline Phosphatase: 31 U/L — ABNORMAL LOW (ref 33–115)
BILIRUBIN TOTAL: 0.2 mg/dL (ref 0.2–1.2)
BUN: 11 mg/dL (ref 7–25)
CO2: 21 mmol/L (ref 20–31)
Calcium: 9.4 mg/dL (ref 8.6–10.2)
Chloride: 110 mmol/L (ref 98–110)
Creat: 0.98 mg/dL (ref 0.50–1.10)
Glucose, Bld: 78 mg/dL (ref 65–99)
Potassium: 4.1 mmol/L (ref 3.5–5.3)
SODIUM: 139 mmol/L (ref 135–146)
Total Protein: 6.8 g/dL (ref 6.1–8.1)

## 2016-10-25 LAB — LIPID PANEL
Cholesterol: 169 mg/dL (ref <200)
HDL: 26 mg/dL — ABNORMAL LOW (ref >50)
LDL CALC: 104 mg/dL — AB (ref <100)
Total CHOL/HDL Ratio: 6.5 Ratio — ABNORMAL HIGH (ref <5.0)
Triglycerides: 196 mg/dL — ABNORMAL HIGH (ref <150)
VLDL: 39 mg/dL — ABNORMAL HIGH (ref <30)

## 2016-10-29 ENCOUNTER — Ambulatory Visit: Payer: Self-pay | Admitting: Physician Assistant

## 2016-11-11 ENCOUNTER — Ambulatory Visit: Payer: Self-pay | Admitting: Physician Assistant

## 2016-11-11 ENCOUNTER — Encounter: Payer: Self-pay | Admitting: Physician Assistant

## 2016-11-11 VITALS — BP 98/64 | HR 65 | Temp 97.9°F | Ht 65.5 in | Wt 197.2 lb

## 2016-11-11 DIAGNOSIS — E669 Obesity, unspecified: Secondary | ICD-10-CM

## 2016-11-11 DIAGNOSIS — Z6832 Body mass index (BMI) 32.0-32.9, adult: Secondary | ICD-10-CM

## 2016-11-11 DIAGNOSIS — F17210 Nicotine dependence, cigarettes, uncomplicated: Secondary | ICD-10-CM

## 2016-11-11 DIAGNOSIS — E785 Hyperlipidemia, unspecified: Secondary | ICD-10-CM

## 2016-11-11 NOTE — Progress Notes (Signed)
BP 98/64 (BP Location: Left Arm, Patient Position: Sitting, Cuff Size: Normal)   Pulse 65   Temp 97.9 F (36.6 C)   Ht 5' 5.5" (1.664 m)   Wt 197 lb 4 oz (89.5 kg)   SpO2 99%   BMI 32.32 kg/m    Subjective:    Patient ID: [name], female    DOB: July 07, 1981, 36 y.o.   MRN: [id number]  HPI: [name] is a 36 y.o. female presenting on 11/11/2016 for Hyperlipidemia   HPI   Pt doing well.  She is still smoking.   Relevant past medical, surgical, family and social history reviewed and updated as indicated. Interim medical history since our last visit reviewed. Allergies and medications reviewed and updated.   Current Outpatient Prescriptions:  .  fenofibrate micronized (LOFIBRA) 134 MG capsule, TAKE 1 CAPSULE BY MOUTH EVERY DAY FOR CHOLESTEROL, Disp: 30 capsule, Rfl: 6 .  naproxen (NAPROSYN) 500 MG tablet, Take 1 tablet (500 mg total) by mouth 2 (two) times daily., Disp: 30 tablet, Rfl: 0 .  simvastatin (ZOCOR) 20 MG tablet, Take 1 tablet (20 mg total) by mouth daily., Disp: 30 tablet, Rfl: 6   Review of Systems  Constitutional: Negative for appetite change, chills, diaphoresis, fatigue, fever and unexpected weight change.  HENT: Negative for congestion, dental problem, drooling, ear pain, facial swelling, hearing loss, mouth sores, sneezing, sore throat, trouble swallowing and voice change.   Eyes: Negative for pain, discharge, redness, itching and visual disturbance.  Respiratory: Negative for cough, choking, shortness of breath and wheezing.   Cardiovascular: Negative for chest pain, palpitations and leg swelling.  Gastrointestinal: Negative for abdominal pain, blood in stool, constipation, diarrhea and vomiting.  Endocrine: Negative for cold intolerance, heat intolerance and polydipsia.  Genitourinary: Negative for decreased urine volume, dysuria and hematuria.  Musculoskeletal: Negative for arthralgias, back pain and gait problem.  Skin: Negative for rash.   Allergic/Immunologic: Negative for environmental allergies.  Neurological: Negative for seizures, syncope, light-headedness and headaches.  Hematological: Negative for adenopathy.  Psychiatric/Behavioral: Negative for agitation, dysphoric mood and suicidal ideas. The patient is not nervous/anxious.     Per HPI unless specifically indicated above     Objective:    BP 98/64 (BP Location: Left Arm, Patient Position: Sitting, Cuff Size: Normal)   Pulse 65   Temp 97.9 F (36.6 C)   Ht 5' 5.5" (1.664 m)   Wt 197 lb 4 oz (89.5 kg)   SpO2 99%   BMI 32.32 kg/m   Wt Readings from Last 3 Encounters:  11/11/16 197 lb 4 oz (89.5 kg)  09/09/16 203 lb (92.1 kg)  07/02/16 200 lb (90.7 kg)    Physical Exam  Constitutional: She is oriented to person, place, and time. She appears well-developed and well-nourished.  HENT:  Head: Normocephalic and atraumatic.  Neck: Neck supple.  Cardiovascular: Normal rate and regular rhythm.   Pulmonary/Chest: Effort normal and breath sounds normal.  Abdominal: Soft. Bowel sounds are normal. She exhibits no mass. There is no hepatosplenomegaly. There is no tenderness.  Musculoskeletal: She exhibits no edema.  Lymphadenopathy:    She has no cervical adenopathy.  Neurological: She is alert and oriented to person, place, and time.  Skin: Skin is warm and dry.  Psychiatric: She has a normal mood and affect. Her behavior is normal.  Vitals reviewed.   Results for orders placed or performed in visit on 10/20/16  Comprehensive Metabolic Panel (CMET)  Result Value Ref Range   Sodium  139 135 - 146 mmol/L   Potassium 4.1 3.5 - 5.3 mmol/L   Chloride 110 98 - 110 mmol/L   CO2 21 20 - 31 mmol/L   Glucose, Bld 78 65 - 99 mg/dL   BUN 11 7 - 25 mg/dL   Creat 1.610.98 0.960.50 - 0.451.10 mg/dL   Total Bilirubin 0.2 0.2 - 1.2 mg/dL   Alkaline Phosphatase 31 (L) 33 - 115 U/L   AST 17 10 - 30 U/L   ALT 15 6 - 29 U/L   Total Protein 6.8 6.1 - 8.1 g/dL   Albumin 4.2 3.6 - 5.1  g/dL   Calcium 9.4 8.6 - 40.910.2 mg/dL  Lipid Profile  Result Value Ref Range   Cholesterol 169 <200 mg/dL   Triglycerides 811196 (H) <150 mg/dL   HDL 26 (L) >91>50 mg/dL   Total CHOL/HDL Ratio 6.5 (H) <5.0 Ratio   VLDL 39 (H) <30 mg/dL   LDL Cholesterol 478104 (H) <100 mg/dL      Assessment & Plan:   Encounter Diagnoses  Name Primary?  . Hyperlipidemia, unspecified hyperlipidemia type Yes  . Cigarette nicotine dependence, uncomplicated   . Class 1 obesity with body mass index (BMI) of 32.0 to 32.9 in adult, unspecified obesity type, unspecified whether serious comorbidity present     -reviewed labs with pt -pt to continue current medications -counseled smoking cessation -Follow up 4 months.  RTO sooner prn

## 2016-12-11 IMAGING — DX DG FOOT COMPLETE 3+V*L*
3 series · 3 of 3 positions shown · non-contrast
Comparison: None.

CLINICAL DATA: LEFT medial foot in arch pain for 1 day, no injury.

EXAM:
LEFT FOOT - COMPLETE 3+ VIEW

[foot ap]
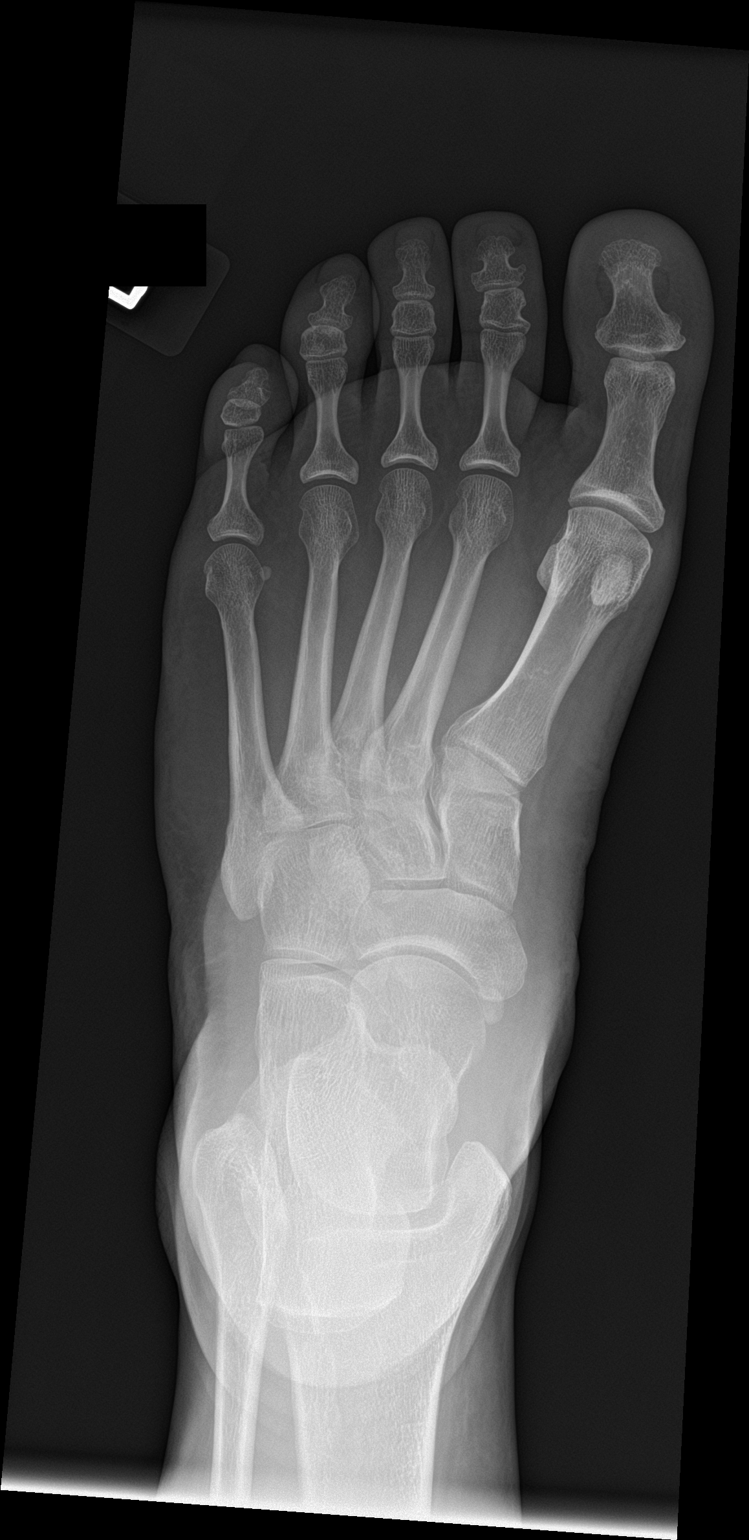

[foot obl]
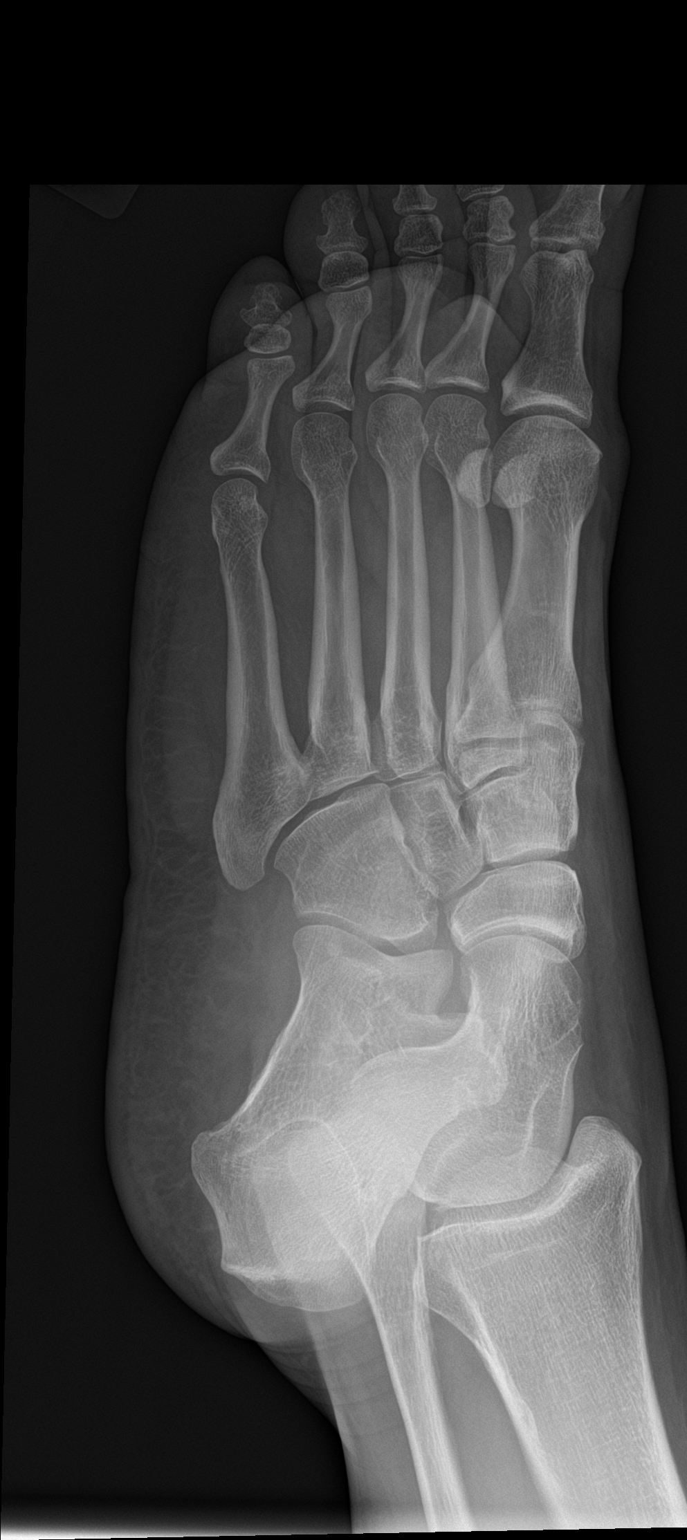

[foot lat]
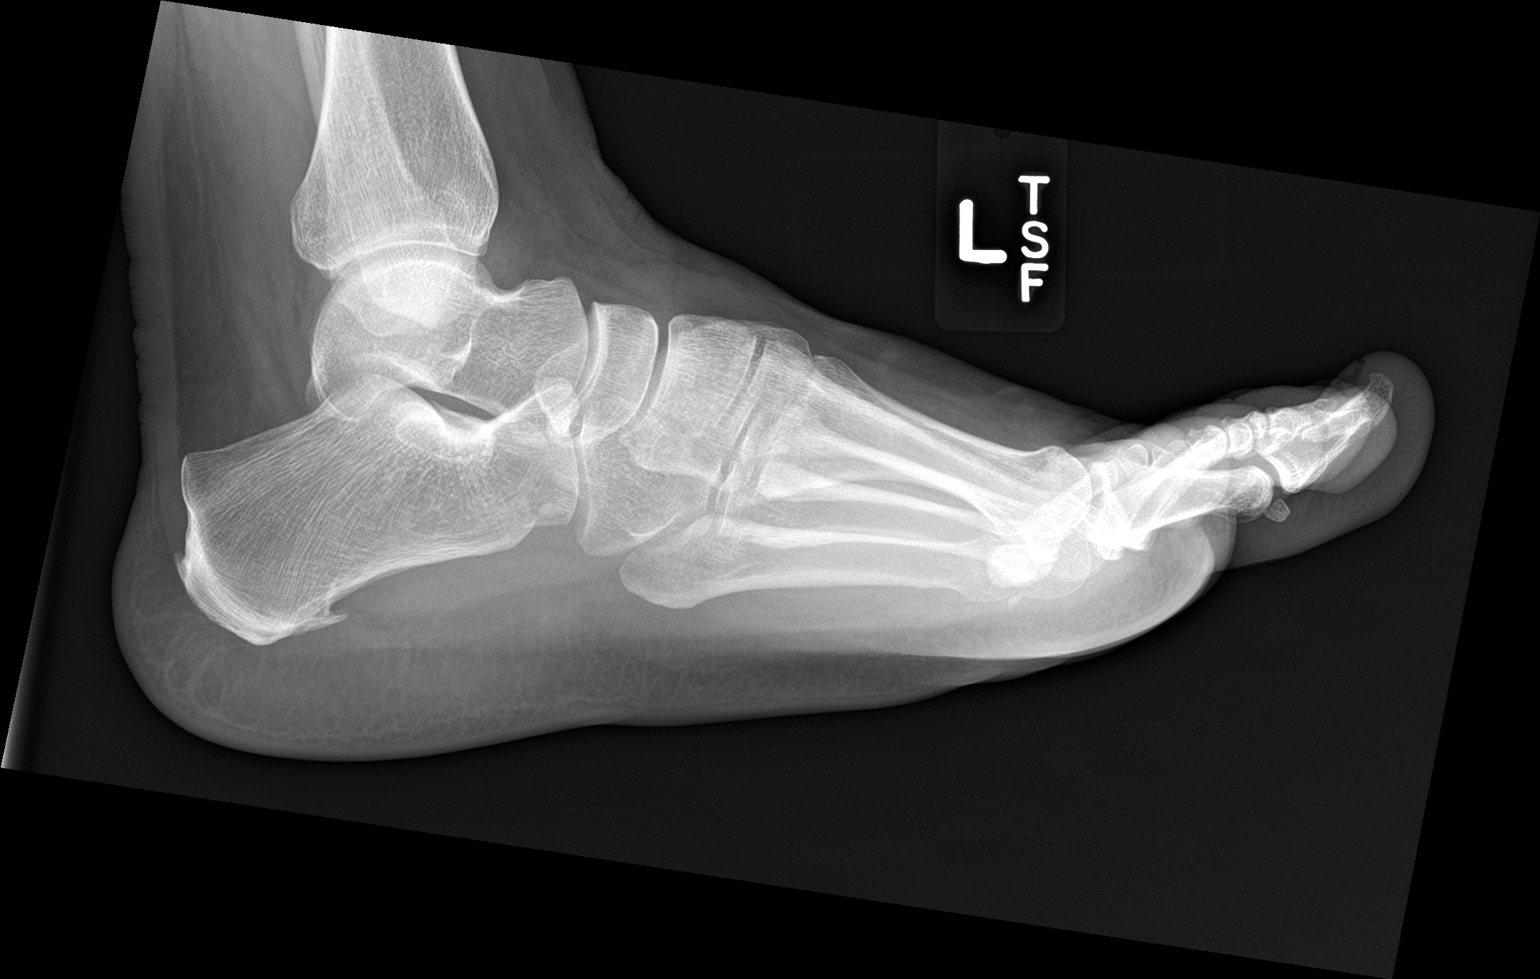

[3 of 3 positions shown; findings below may reference images not displayed]

FINDINGS: There is no evidence of fracture or dislocation. Small plantar
calcaneal spur. There is no evidence of arthropathy or other focal
bone abnormality. Os naviculare. Mild midfoot soft tissue swelling
without subcutaneous gas or radiopaque foreign bodies.
IMPRESSION: Midfoot soft tissue swelling without acute osseous process.

## 2017-01-26 ENCOUNTER — Other Ambulatory Visit: Payer: Self-pay | Admitting: Physician Assistant

## 2017-01-26 MED ORDER — SIMVASTATIN 20 MG PO TABS: 20.0000 mg | ORAL_TABLET | Freq: Every day | ORAL | 6 refills | Status: DC

## 2017-02-26 ENCOUNTER — Other Ambulatory Visit: Payer: Self-pay | Admitting: Physician Assistant

## 2017-03-03 ENCOUNTER — Telehealth: Payer: Self-pay | Admitting: Physician Assistant

## 2017-03-03 NOTE — Telephone Encounter (Signed)
Pt. called in stating she had went to tanning bed 3 times and now she is itching. Pt. stated she has sratched her stomach until she has bruises on it. Carollee HerterShannon recommended that the patient try cool compresses, OTC benedryl, and OTC cortisone cream. Pt. was also told to avoid hot showers and if not any better by Thursday to call the office to make an appt.

## 2017-03-10 ENCOUNTER — Emergency Department (HOSPITAL_COMMUNITY)
Admission: EM | Admit: 2017-03-10 | Discharge: 2017-03-10 | Disposition: A | Discharge status: Home / Self Care | Payer: Medicaid Other | Attending: Emergency Medicine | Admitting: Emergency Medicine

## 2017-03-10 ENCOUNTER — Emergency Department (HOSPITAL_COMMUNITY): Payer: Medicaid Other

## 2017-03-10 ENCOUNTER — Other Ambulatory Visit (HOSPITAL_COMMUNITY)
Admission: RE | Admit: 2017-03-10 | Discharge: 2017-03-10 | Disposition: A | Discharge status: Home / Self Care | Payer: Medicaid Other | Source: Ambulatory Visit | Attending: Physician Assistant | Admitting: Physician Assistant

## 2017-03-10 ENCOUNTER — Encounter (HOSPITAL_COMMUNITY): Payer: Self-pay | Admitting: *Deleted

## 2017-03-10 DIAGNOSIS — M25511 Pain in right shoulder: Secondary | ICD-10-CM

## 2017-03-10 DIAGNOSIS — F17210 Nicotine dependence, cigarettes, uncomplicated: Secondary | ICD-10-CM | POA: Insufficient documentation

## 2017-03-10 DIAGNOSIS — Z79899 Other long term (current) drug therapy: Secondary | ICD-10-CM | POA: Insufficient documentation

## 2017-03-10 DIAGNOSIS — Z9049 Acquired absence of other specified parts of digestive tract: Secondary | ICD-10-CM | POA: Insufficient documentation

## 2017-03-10 LAB — COMPREHENSIVE METABOLIC PANEL
ALK PHOS: 33 U/L — AB (ref 38–126)
ALT: 26 U/L (ref 14–54)
ANION GAP: 6 (ref 5–15)
AST: 26 U/L (ref 15–41)
Albumin: 4.3 g/dL (ref 3.5–5.0)
BILIRUBIN TOTAL: 0.3 mg/dL (ref 0.3–1.2)
BUN: 11 mg/dL (ref 6–20)
CO2: 23 mmol/L (ref 22–32)
CREATININE: 0.98 mg/dL (ref 0.44–1.00)
Calcium: 9.4 mg/dL (ref 8.9–10.3)
Chloride: 109 mmol/L (ref 101–111)
GFR calc Af Amer: >60 mL/min (ref >60)
GFR calc non Af Amer: >60 mL/min (ref >60)
Glucose, Bld: 97 mg/dL (ref 65–99)
Potassium: 4 mmol/L (ref 3.5–5.1)
SODIUM: 138 mmol/L (ref 135–145)
TOTAL PROTEIN: 7.3 g/dL (ref 6.5–8.1)

## 2017-03-10 LAB — LIPID PANEL
Cholesterol: 176 mg/dL (ref 0–200)
HDL: 32 mg/dL — ABNORMAL LOW (ref >40)
LDL CALC: 113 mg/dL — AB (ref 0–99)
TRIGLYCERIDES: 157 mg/dL — AB (ref <150)
Total CHOL/HDL Ratio: 5.5 RATIO
VLDL: 31 mg/dL (ref 0–40)

## 2017-03-10 MED ORDER — IBUPROFEN 800 MG PO TABS
800.0000 mg | ORAL_TABLET | Freq: Once | ORAL | Status: AC
  Administered 2017-03-10: 800 mg via ORAL
  Filled 2017-03-10: qty 1

## 2017-03-10 MED ORDER — HYDROCODONE-ACETAMINOPHEN 5-325 MG PO TABS
1.0000 | ORAL_TABLET | Freq: Once | ORAL | Status: AC
  Administered 2017-03-10: 1 via ORAL
  Filled 2017-03-10: qty 1

## 2017-03-10 MED ORDER — HYDROCODONE-ACETAMINOPHEN 5-325 MG PO TABS: ORAL_TABLET | ORAL | 0 refills | Status: DC

## 2017-03-10 MED ORDER — IBUPROFEN 800 MG PO TABS: 800.0000 mg | ORAL_TABLET | Freq: Three times a day (TID) | ORAL | 0 refills | Status: DC

## 2017-03-10 NOTE — ED Notes (Signed)
Pt alert & oriented x4, stable gait. Patient given discharge instructions, paperwork & prescription(s). Patient informed not to drive, operate any equipment & handel any important documents 4 hours after taking pain medication. Patient  instructed to stop at the registration desk to finish any additional paperwork. Patient  verbalized understanding. Pt left department w/ no further questions. 

## 2017-03-10 NOTE — Discharge Instructions (Signed)
Apply ice packs on/off to your shoulder.  Call Dr. Mort SawyersHarrison's office to arrange a follow-up appt in a few days if not improving

## 2017-03-10 NOTE — ED Triage Notes (Signed)
Pt reports her trunk lid fell on her right shoulder today. Pt states she has been having pain in this shoulder x 1 month.

## 2017-03-11 ENCOUNTER — Other Ambulatory Visit: Payer: Self-pay | Admitting: Physician Assistant

## 2017-03-13 NOTE — ED Provider Notes (Signed)
[facility] Provider Note   CSN: [id number] Arrival date & time: 03/10/17  2005     History   Chief Complaint Chief Complaint  Patient presents with  . Shoulder Pain    HPI Lamonda A Cozza is a 36 y.o. female.  HPI   Tiawanna A Morganti is a 36 y.o. female who presents to the Emergency Department complaining of right shoulder pain after a direct blow to the anterior shoulder.  She reports hx of chronic pain to the shoulder that worsened after the incident.  Pain associated with movement of the right arm.  Pain improves at rest.  She denies headaches, neck pain, fever, numbness or weakness of the right upper extremity.    Past Medical History:  Diagnosis Date  . Hypercholesteremia     Patient Active Problem List   Diagnosis Date Noted  . Hyperlipidemia 09/27/2015  . Obesity, unspecified 09/27/2015  . Cigarette nicotine dependence, uncomplicated 09/27/2015  . Cholecystitis with cholelithiasis 07/13/2014    Past Surgical History:  Procedure Laterality Date  . CHOLECYSTECTOMY N/A 07/13/2014   Procedure: LAPAROSCOPIC CHOLECYSTECTOMY;  Surgeon: Marlane Hatcher, MD;  Location: [facility];  Service: General;  Laterality: N/A;  . DENTAL SURGERY  2018   plate top & bottom  . TUBAL LIGATION      OB History    No data available       Home Medications    Prior to Admission medications   Medication Sig Start Date End Date Taking? Authorizing Provider  fenofibrate micronized (LOFIBRA) 134 MG capsule TAKE 1 CAPSULE BY MOUTH EVERY DAY FOR CHOLESTEROL 02/26/17   Jacquelin Hawking, PA-C  HYDROcodone-acetaminophen (NORCO/VICODIN) 5-325 MG tablet Take one-two tabs po q 4-6 hrs prn pain 03/10/17   Kodey Xue, PA-C  ibuprofen (ADVIL,MOTRIN) 800 MG tablet Take 1 tablet (800 mg total) by mouth 3 (three) times daily. 03/10/17   Yeng Perz, PA-C  naproxen (NAPROSYN) 500 MG tablet Take 1 tablet (500 mg total) by mouth 2 (two) times daily. 04/24/16   Horton, Mayer Masker, MD    simvastatin (ZOCOR) 20 MG tablet Take 1 tablet (20 mg total) by mouth daily. 01/26/17   Jacquelin Hawking, PA-C    Family History Family History  Problem Relation Age of Onset  . Heart disease Mother   . Diabetes Mother   . Heart disease Father   . Hypertension Father   . Thyroid disease Father   . Hyperlipidemia Father     Social History Social History  Substance Use Topics  . Smoking status: Current Every Day Smoker    Packs/day: 0.50    Years: 15.00    Types: Cigarettes  . Smokeless tobacco: Never Used  . Alcohol use No     Allergies   Patient has no known allergies.   Review of Systems Review of Systems  Constitutional: Negative for chills and fever.  Respiratory: Negative for chest tightness and shortness of breath.   Cardiovascular: Negative for chest pain.  Musculoskeletal: Positive for arthralgias (right shoulder pain). Negative for joint swelling, neck pain and neck stiffness.  Skin: Negative for color change and wound.  Neurological: Negative for weakness, numbness and headaches.  All other systems reviewed and are negative.    Physical Exam Updated Vital Signs BP 115/72   Pulse 68   Temp 98.6 F (37 C) (Oral)   Wt 95.3 kg (210 lb)   LMP 02/08/2017   SpO2 98%   BMI 34.41 kg/m   Physical Exam  Constitutional: She is  oriented to person, place, and time. She appears well-developed and well-nourished. No distress.  HENT:  Head: Normocephalic and atraumatic.  Neck: Normal range of motion. Neck supple. No thyromegaly present.  Cardiovascular: Normal rate, regular rhythm and intact distal pulses.   No murmur heard. Pulmonary/Chest: Effort normal and breath sounds normal. No respiratory distress. She exhibits no tenderness.  Musculoskeletal: She exhibits tenderness. She exhibits no edema.  ttp of the anterior right shoulder.  Pain with abduction of the right arm and rotation of the shoulder.  Grip strength is strong and symmetrical.   No abrasions,  edema , erythema or step-off deformity of the joint.   Lymphadenopathy:    She has no cervical adenopathy.  Neurological: She is alert and oriented to person, place, and time. She has normal strength. No sensory deficit. She exhibits normal muscle tone. Coordination normal.  Reflex Scores:      Tricep reflexes are 1+ on the right side and 2+ on the left side.      Bicep reflexes are 1+ on the right side and 2+ on the left side. Skin: Skin is warm and dry. Capillary refill takes less than 2 seconds.  Nursing note and vitals reviewed.    ED Treatments / Results  Labs (all labs ordered are listed, but only abnormal results are displayed) Labs Reviewed - No data to display  EKG  EKG Interpretation None       Radiology Dg Shoulder Right  Result Date: 03/10/2017 CLINICAL DATA:  36 y/o  F; right shoulder pain. EXAM: RIGHT SHOULDER - 2+ VIEW COMPARISON:  None. FINDINGS: There is no evidence of fracture or dislocation. There is no evidence of arthropathy or other focal bone abnormality. Soft tissues are unremarkable. IMPRESSION: Negative. Electronically Signed   By: Mitzi HansenLance  Furusawa-Stratton M.D.   On: 03/10/2017 21:33     Procedures Procedures (including critical care time)  Medications Ordered in ED Medications  HYDROcodone-acetaminophen (NORCO/VICODIN) 5-325 MG per tablet 1 tablet (1 tablet Oral Given 03/10/17 2304)  ibuprofen (ADVIL,MOTRIN) tablet 800 mg (800 mg Oral Given 03/10/17 2304)     Initial Impression / Assessment and Plan / ED Course  I have reviewed the triage vital signs and the nursing notes.  Pertinent labs & imaging results that were available during my care of the patient were reviewed by me and considered in my medical decision making (see chart for details).     XR neg for fx and AC separation.  NV intact.  Discussed possible rotator cuff injury.  Appears stable for d/c.  Referral given for orthopedics.  No concerning sx's for septic joint.   Final Clinical  Impressions(s) / ED Diagnoses   Final diagnoses:  Acute pain of right shoulder    New Prescriptions Discharge Medication List as of 03/10/2017 10:58 PM    START taking these medications   Details  HYDROcodone-acetaminophen (NORCO/VICODIN) 5-325 MG tablet Take one-two tabs po q 4-6 hrs prn pain, Print    ibuprofen (ADVIL,MOTRIN) 800 MG tablet Take 1 tablet (800 mg total) by mouth 3 (three) times daily., Starting Tue 03/10/2017, Print         Trisha Mangleriplett, [place], PA-C 03/13/17 1340    Loren RacerYelverton, David, MD 03/19/17 1424

## 2017-03-17 ENCOUNTER — Ambulatory Visit: Payer: Self-pay | Admitting: Physician Assistant

## 2017-03-17 ENCOUNTER — Encounter: Payer: Self-pay | Admitting: Physician Assistant

## 2017-03-17 VITALS — BP 100/60 | HR 64 | Temp 97.9°F | Wt 204.0 lb

## 2017-03-17 DIAGNOSIS — E785 Hyperlipidemia, unspecified: Secondary | ICD-10-CM

## 2017-03-17 DIAGNOSIS — F17210 Nicotine dependence, cigarettes, uncomplicated: Secondary | ICD-10-CM

## 2017-03-17 NOTE — Progress Notes (Signed)
BP 100/60   Pulse 64   Temp 97.9 F (36.6 C)   Wt 204 lb (92.5 kg)   BMI 33.43 kg/m    Subjective:    Patient ID: [name], female    DOB: 1981/09/14, 36 y.o.   MRN: [id number]  HPI: [name] is a 36 y.o. female presenting on 03/17/2017 for Hyperlipidemia   HPI   Pt still smoking  She is doing well and has no complaints today  Relevant past medical, surgical, family and social history reviewed and updated as indicated. Interim medical history since our last visit reviewed. Allergies and medications reviewed and updated.   Current Outpatient Prescriptions:  .  fenofibrate micronized (LOFIBRA) 134 MG capsule, TAKE 1 CAPSULE BY MOUTH EVERY DAY FOR CHOLESTEROL, Disp: 30 capsule, Rfl: 4 .  simvastatin (ZOCOR) 20 MG tablet, Take 1 tablet (20 mg total) by mouth daily., Disp: 30 tablet, Rfl: 6   Review of Systems  Constitutional: Negative for appetite change, chills, diaphoresis, fatigue, fever and unexpected weight change.  HENT: Negative for congestion, dental problem, drooling, ear pain, facial swelling, hearing loss, mouth sores, sneezing, sore throat, trouble swallowing and voice change.   Eyes: Negative for pain, discharge, redness, itching and visual disturbance.  Respiratory: Negative for cough, choking, shortness of breath and wheezing.   Cardiovascular: Negative for chest pain, palpitations and leg swelling.  Gastrointestinal: Negative for abdominal pain, blood in stool, constipation, diarrhea and vomiting.  Endocrine: Negative for cold intolerance, heat intolerance and polydipsia.  Genitourinary: Negative for decreased urine volume, dysuria and hematuria.  Musculoskeletal: Negative for arthralgias, back pain and gait problem.  Skin: Negative for rash.  Allergic/Immunologic: Negative for environmental allergies.  Neurological: Negative for seizures, syncope, light-headedness and headaches.  Hematological: Negative for adenopathy.  Psychiatric/Behavioral:  Negative for agitation, dysphoric mood and suicidal ideas. The patient is not nervous/anxious.     Per HPI unless specifically indicated above     Objective:    BP 100/60   Pulse 64   Temp 97.9 F (36.6 C)   Wt 204 lb (92.5 kg)   BMI 33.43 kg/m   Wt Readings from Last 3 Encounters:  03/17/17 204 lb (92.5 kg)  03/10/17 210 lb (95.3 kg)  11/11/16 197 lb 4 oz (89.5 kg)    Physical Exam  Constitutional: She is oriented to person, place, and time. She appears well-developed and well-nourished.  HENT:  Head: Normocephalic and atraumatic.  Neck: Neck supple.  Cardiovascular: Normal rate and regular rhythm.   Pulmonary/Chest: Effort normal and breath sounds normal.  Abdominal: Soft. Bowel sounds are normal. She exhibits no mass. There is no hepatosplenomegaly. There is no tenderness.  Musculoskeletal: She exhibits no edema.  Lymphadenopathy:    She has no cervical adenopathy.  Neurological: She is alert and oriented to person, place, and time.  Skin: Skin is warm and dry.  Psychiatric: She has a normal mood and affect. Her behavior is normal.  Vitals reviewed.   Results for orders placed or performed during the hospital encounter of 03/10/17  Comprehensive metabolic panel  Result Value Ref Range   Sodium 138 135 - 145 mmol/L   Potassium 4.0 3.5 - 5.1 mmol/L   Chloride 109 101 - 111 mmol/L   CO2 23 22 - 32 mmol/L   Glucose, Bld 97 65 - 99 mg/dL   BUN 11 6 - 20 mg/dL   Creatinine, Ser 1.32 0.44 - 1.00 mg/dL   Calcium 9.4 8.9 - 44.0 mg/dL  Total Protein 7.3 6.5 - 8.1 g/dL   Albumin 4.3 3.5 - 5.0 g/dL   AST 26 15 - 41 U/L   ALT 26 14 - 54 U/L   Alkaline Phosphatase 33 (L) 38 - 126 U/L   Total Bilirubin 0.3 0.3 - 1.2 mg/dL   GFR calc non Af Amer >60 >60 mL/min   GFR calc Af Amer >60 >60 mL/min   Anion gap 6 5 - 15  Lipid panel  Result Value Ref Range   Cholesterol 176 0 - 200 mg/dL   Triglycerides 161157 (H) <150 mg/dL   HDL 32 (L) >09>40 mg/dL   Total CHOL/HDL Ratio 5.5  RATIO   VLDL 31 0 - 40 mg/dL   LDL Cholesterol 604113 (H) 0 - 99 mg/dL      Assessment & Plan:   Encounter Diagnoses  Name Primary?  . Hyperlipidemia, unspecified hyperlipidemia type Yes  . Cigarette nicotine dependence, uncomplicated      -will call pt with lab results (internet down while pt in office) -pt to continue current meds -counseled smoking cessation -follow up 6 months. RTO sooner prn

## 2017-04-16 ENCOUNTER — Other Ambulatory Visit: Payer: Self-pay | Admitting: Physician Assistant

## 2017-04-16 DIAGNOSIS — E785 Hyperlipidemia, unspecified: Secondary | ICD-10-CM

## 2017-07-27 ENCOUNTER — Ambulatory Visit: Payer: Self-pay | Admitting: Physician Assistant

## 2017-09-14 ENCOUNTER — Other Ambulatory Visit (HOSPITAL_COMMUNITY)
Admission: RE | Admit: 2017-09-14 | Discharge: 2017-09-14 | Disposition: A | Discharge status: Home / Self Care | Payer: Self-pay | Source: Ambulatory Visit | Attending: Physician Assistant | Admitting: Physician Assistant

## 2017-09-14 DIAGNOSIS — E785 Hyperlipidemia, unspecified: Secondary | ICD-10-CM | POA: Insufficient documentation

## 2017-09-14 LAB — COMPREHENSIVE METABOLIC PANEL
ALT: 26 U/L (ref 14–54)
AST: 28 U/L (ref 15–41)
Albumin: 4.1 g/dL (ref 3.5–5.0)
Alkaline Phosphatase: 41 U/L (ref 38–126)
Anion gap: 8 (ref 5–15)
BUN: 11 mg/dL (ref 6–20)
CALCIUM: 9.2 mg/dL (ref 8.9–10.3)
CHLORIDE: 107 mmol/L (ref 101–111)
CO2: 21 mmol/L — ABNORMAL LOW (ref 22–32)
CREATININE: 0.87 mg/dL (ref 0.44–1.00)
Glucose, Bld: 95 mg/dL (ref 65–99)
Potassium: 3.6 mmol/L (ref 3.5–5.1)
Sodium: 136 mmol/L (ref 135–145)
TOTAL PROTEIN: 7.2 g/dL (ref 6.5–8.1)
Total Bilirubin: 0.6 mg/dL (ref 0.3–1.2)

## 2017-09-14 LAB — LIPID PANEL
Cholesterol: 231 mg/dL — ABNORMAL HIGH (ref 0–200)
HDL: 29 mg/dL — AB (ref >40)
LDL CALC: UNDETERMINED mg/dL (ref 0–99)
TRIGLYCERIDES: 475 mg/dL — AB (ref <150)
Total CHOL/HDL Ratio: 8 RATIO
VLDL: UNDETERMINED mg/dL (ref 0–40)

## 2017-09-17 ENCOUNTER — Encounter: Payer: Self-pay | Admitting: Physician Assistant

## 2017-09-17 ENCOUNTER — Ambulatory Visit: Payer: Self-pay | Admitting: Physician Assistant

## 2017-09-17 VITALS — BP 112/68 | HR 55 | Temp 97.9°F | Ht 65.5 in | Wt 217.0 lb

## 2017-09-17 DIAGNOSIS — E669 Obesity, unspecified: Secondary | ICD-10-CM

## 2017-09-17 DIAGNOSIS — F17210 Nicotine dependence, cigarettes, uncomplicated: Secondary | ICD-10-CM

## 2017-09-17 DIAGNOSIS — E785 Hyperlipidemia, unspecified: Secondary | ICD-10-CM

## 2017-09-17 NOTE — Progress Notes (Signed)
BP 112/68   Pulse (!) 55   Temp 97.9 F (36.6 C)   Ht 5' 5.5" (1.664 m)   Wt 217 lb (98.4 kg)   LMP 09/14/2017 (Exact Date)   SpO2 98%   BMI 35.56 kg/m    Subjective:    Patient ID: [name], female    DOB: 07/16/1981, 36 y.o.   MRN: [id number]  HPI: [name] is a 36 y.o. female presenting on 09/17/2017 for Follow-up   HPI   Pt is doing well She is still smoking  Pt says she was out of her fenofibrate for a week prior to getting her blood drawn.  Relevant past medical, surgical, family and social history reviewed and updated as indicated. Interim medical history since our last visit reviewed. Allergies and medications reviewed and updated.   Current Outpatient Medications:  .  fenofibrate micronized (LOFIBRA) 134 MG capsule, TAKE 1 CAPSULE BY MOUTH EVERY DAY FOR CHOLESTEROL, Disp: 30 capsule, Rfl: 4 .  simvastatin (ZOCOR) 20 MG tablet, Take 1 tablet (20 mg total) by mouth daily., Disp: 30 tablet, Rfl: 6   Review of Systems  Constitutional: Negative for appetite change, chills, diaphoresis, fatigue, fever and unexpected weight change.  HENT: Negative for congestion, dental problem, drooling, ear pain, facial swelling, hearing loss, mouth sores, sneezing, sore throat, trouble swallowing and voice change.   Eyes: Negative for pain, discharge, redness, itching and visual disturbance.  Respiratory: Negative for cough, choking, shortness of breath and wheezing.   Cardiovascular: Negative for chest pain, palpitations and leg swelling.  Gastrointestinal: Negative for abdominal pain, blood in stool, constipation, diarrhea and vomiting.  Endocrine: Negative for cold intolerance, heat intolerance and polydipsia.  Genitourinary: Negative for decreased urine volume, dysuria and hematuria.  Musculoskeletal: Negative for arthralgias, back pain and gait problem.  Skin: Negative for rash.  Allergic/Immunologic: Negative for environmental allergies.  Neurological: Negative  for seizures, syncope, light-headedness and headaches.  Hematological: Negative for adenopathy.  Psychiatric/Behavioral: Negative for agitation, dysphoric mood and suicidal ideas. The patient is not nervous/anxious.     Per HPI unless specifically indicated above     Objective:    BP 112/68   Pulse (!) 55   Temp 97.9 F (36.6 C)   Ht 5' 5.5" (1.664 m)   Wt 217 lb (98.4 kg)   LMP 09/14/2017 (Exact Date)   SpO2 98%   BMI 35.56 kg/m   Wt Readings from Last 3 Encounters:  09/17/17 217 lb (98.4 kg)  03/17/17 204 lb (92.5 kg)  03/10/17 210 lb (95.3 kg)    Physical Exam  Constitutional: She is oriented to person, place, and time. She appears well-developed and well-nourished.  HENT:  Head: Normocephalic and atraumatic.  Neck: Neck supple.  Cardiovascular: Normal rate and regular rhythm.  Pulmonary/Chest: Effort normal and breath sounds normal.  Abdominal: Soft. Bowel sounds are normal. She exhibits no mass. There is no hepatosplenomegaly. There is no tenderness.  Musculoskeletal: She exhibits no edema.  Lymphadenopathy:    She has no cervical adenopathy.  Neurological: She is alert and oriented to person, place, and time.  Skin: Skin is warm and dry.  Psychiatric: She has a normal mood and affect. Her behavior is normal.  Vitals reviewed.   Results for orders placed or performed during the hospital encounter of 09/14/17  Lipid panel  Result Value Ref Range   Cholesterol 231 (H) 0 - 200 mg/dL   Triglycerides 409475 (H) <150 mg/dL   HDL 29 (L) >81>40 mg/dL  Total CHOL/HDL Ratio 8.0 RATIO   VLDL UNABLE TO CALCULATE IF TRIGLYCERIDE OVER 400 mg/dL 0 - 40 mg/dL   LDL Cholesterol UNABLE TO CALCULATE IF TRIGLYCERIDE OVER 400 mg/dL 0 - 99 mg/dL  Comprehensive metabolic panel  Result Value Ref Range   Sodium 136 135 - 145 mmol/L   Potassium 3.6 3.5 - 5.1 mmol/L   Chloride 107 101 - 111 mmol/L   CO2 21 (L) 22 - 32 mmol/L   Glucose, Bld 95 65 - 99 mg/dL   BUN 11 6 - 20 mg/dL    Creatinine, Ser 1.610.87 0.44 - 1.00 mg/dL   Calcium 9.2 8.9 - 09.610.3 mg/dL   Total Protein 7.2 6.5 - 8.1 g/dL   Albumin 4.1 3.5 - 5.0 g/dL   AST 28 15 - 41 U/L   ALT 26 14 - 54 U/L   Alkaline Phosphatase 41 38 - 126 U/L   Total Bilirubin 0.6 0.3 - 1.2 mg/dL   GFR calc non Af Amer >60 >60 mL/min   GFR calc Af Amer >60 >60 mL/min   Anion gap 8 5 - 15      Assessment & Plan:   Encounter Diagnoses  Name Primary?  . Hyperlipidemia, unspecified hyperlipidemia type Yes  . Cigarette nicotine dependence, uncomplicated   . Obesity, unspecified classification, unspecified obesity type, unspecified whether serious comorbidity present      -reviewed labs with pt -counseled pt to avoid running out of her medication -also discussed that low-fat diet and regular exercise help the cholesterol -counseled smoking cessation -pt to follow up 3 months.  RTO sooner prn

## 2017-10-15 ENCOUNTER — Other Ambulatory Visit: Payer: Self-pay | Admitting: Physician Assistant

## 2017-10-15 MED ORDER — SIMVASTATIN 20 MG PO TABS: 20.0000 mg | ORAL_TABLET | Freq: Every day | ORAL | 6 refills | Status: DC

## 2017-10-19 ENCOUNTER — Other Ambulatory Visit: Payer: Self-pay | Admitting: Physician Assistant

## 2017-11-19 ENCOUNTER — Ambulatory Visit: Payer: Self-pay | Admitting: Physician Assistant

## 2017-11-19 ENCOUNTER — Encounter: Payer: Self-pay | Admitting: Physician Assistant

## 2017-11-19 VITALS — BP 114/69 | HR 80 | Temp 99.0°F | Ht 65.5 in | Wt 216.5 lb

## 2017-11-19 DIAGNOSIS — J209 Acute bronchitis, unspecified: Secondary | ICD-10-CM

## 2017-11-19 DIAGNOSIS — F17218 Nicotine dependence, cigarettes, with other nicotine-induced disorders: Secondary | ICD-10-CM

## 2017-11-19 DIAGNOSIS — R0602 Shortness of breath: Secondary | ICD-10-CM

## 2017-11-19 DIAGNOSIS — R062 Wheezing: Secondary | ICD-10-CM

## 2017-11-19 MED ORDER — ALBUTEROL SULFATE (2.5 MG/3ML) 0.083% IN NEBU: 2.5000 mg | INHALATION_SOLUTION | Freq: Four times a day (QID) | RESPIRATORY_TRACT | 1 refills | Status: DC | PRN

## 2017-11-19 MED ORDER — PROMETHAZINE-DM 6.25-15 MG/5ML PO SYRP: 5.0000 mL | ORAL_SOLUTION | Freq: Four times a day (QID) | ORAL | 0 refills | Status: DC | PRN

## 2017-11-19 MED ORDER — ALBUTEROL SULFATE (2.5 MG/3ML) 0.083% IN NEBU
2.5000 mg | INHALATION_SOLUTION | Freq: Once | RESPIRATORY_TRACT | Status: AC
  Administered 2017-11-19: 2.5 mg via RESPIRATORY_TRACT

## 2017-11-19 NOTE — Progress Notes (Signed)
BP 114/69 (BP Location: Right Arm, Patient Position: Sitting, Cuff Size: Normal)   Pulse 80   Temp 99 F (37.2 C) (Other (Comment))   Ht 5' 5.5" (1.664 m)   Wt 216 lb 8 oz (98.2 kg)   SpO2 98%   BMI 35.48 kg/m    Subjective:    Patient ID: [name], female    DOB: 04/21/1981, 37 y.o.   MRN: [id number]  HPI: [name] is a 37 y.o. female presenting on 11/19/2017 for Cough (c/o non-productive cough, sore throat, congestion, headache sine Tuesday night, has tried motrin and tylenol with no relief) and Sore Throat   HPI  Chief Complaint  Patient presents with  . Cough    c/o non-productive cough, sore throat, congestion, headache sine Tuesday night, has tried motrin and tylenol with no relief  . Sore Throat    No fevers or body aches.  No self treat besides the IBU and APAP.  Some wheezing.  Feels sob.  She is still smoking.    Pt states she has had 2 sick children home from school all week.   Relevant past medical, surgical, family and social history reviewed and updated as indicated. Interim medical history since our last visit reviewed. Allergies and medications reviewed and updated.   Current Outpatient Medications:  .  fenofibrate micronized (LOFIBRA) 134 MG capsule, TAKE 1 CAPSULE BY MOUTH EVERY DAY FOR CHOLESTEROL, Disp: 30 capsule, Rfl: 6 .  simvastatin (ZOCOR) 20 MG tablet, Take 1 tablet (20 mg total) by mouth daily., Disp: 30 tablet, Rfl: 6   Review of Systems  Constitutional: Negative for appetite change, chills, diaphoresis, fatigue, fever and unexpected weight change.  HENT: Positive for congestion, sneezing, sore throat and voice change. Negative for dental problem, drooling, ear pain, facial swelling, hearing loss, mouth sores and trouble swallowing.   Eyes: Negative for pain, discharge, redness, itching and visual disturbance.  Respiratory: Positive for shortness of breath. Negative for cough, choking and wheezing.   Cardiovascular: Negative for chest  pain, palpitations and leg swelling.  Gastrointestinal: Negative for abdominal pain, blood in stool, constipation, diarrhea and vomiting.  Endocrine: Negative for cold intolerance, heat intolerance and polydipsia.  Genitourinary: Negative for decreased urine volume, dysuria and hematuria.  Musculoskeletal: Negative for arthralgias, back pain and gait problem.  Skin: Negative for rash.  Allergic/Immunologic: Negative for environmental allergies.  Neurological: Positive for headaches. Negative for seizures, syncope and light-headedness.  Hematological: Negative for adenopathy.  Psychiatric/Behavioral: Negative for agitation, dysphoric mood and suicidal ideas. The patient is not nervous/anxious.     Per HPI unless specifically indicated above     Objective:    BP 114/69 (BP Location: Right Arm, Patient Position: Sitting, Cuff Size: Normal)   Pulse 80   Temp 99 F (37.2 C) (Other (Comment))   Ht 5' 5.5" (1.664 m)   Wt 216 lb 8 oz (98.2 kg)   SpO2 98%   BMI 35.48 kg/m   Wt Readings from Last 3 Encounters:  11/19/17 216 lb 8 oz (98.2 kg)  09/17/17 217 lb (98.4 kg)  03/17/17 204 lb (92.5 kg)    Physical Exam  Constitutional: She is oriented to person, place, and time. She appears well-developed and well-nourished.  HENT:  Head: Normocephalic and atraumatic.  Neck: Neck supple.  Cardiovascular: Normal rate and regular rhythm.  Pulmonary/Chest: Effort normal. No respiratory distress. She has wheezes. She has no rales.  Abdominal: Soft. Bowel sounds are normal. She exhibits no mass. There is  no hepatosplenomegaly. There is no tenderness.  Musculoskeletal: She exhibits no edema.  Lymphadenopathy:    She has no cervical adenopathy.  Neurological: She is alert and oriented to person, place, and time.  Skin: Skin is warm and dry.  Psychiatric: She has a normal mood and affect. Her behavior is normal.  Vitals reviewed.       Assessment & Plan:   Encounter Diagnoses  Name  Primary?  . Acute bronchitis, unspecified organism Yes  . SOB (shortness of breath)   . Wheezing   . Cigarette nicotine dependence with other nicotine-induced disorder     -neb treatment given in office -pt given albuterol for nebulizer to use at home as needed -urged pt to avoid smoking -encouraged rest, fluids.   -gave rx promethazine dm to use prn.  Counseled pt to avoid using this medicine while driving due to sedation -pt to RTO if worsens or persists

## 2017-11-19 NOTE — Patient Instructions (Signed)

## 2017-12-11 ENCOUNTER — Other Ambulatory Visit (HOSPITAL_COMMUNITY)
Admission: RE | Admit: 2017-12-11 | Discharge: 2017-12-11 | Disposition: A | Discharge status: Home / Self Care | Payer: Self-pay | Source: Ambulatory Visit | Attending: Physician Assistant | Admitting: Physician Assistant

## 2017-12-11 DIAGNOSIS — E785 Hyperlipidemia, unspecified: Secondary | ICD-10-CM | POA: Insufficient documentation

## 2017-12-11 LAB — COMPREHENSIVE METABOLIC PANEL
ALBUMIN: 4.3 g/dL (ref 3.5–5.0)
ALT: 30 U/L (ref 14–54)
AST: 34 U/L (ref 15–41)
Alkaline Phosphatase: 28 U/L — ABNORMAL LOW (ref 38–126)
Anion gap: 10 (ref 5–15)
BUN: 14 mg/dL (ref 6–20)
CHLORIDE: 107 mmol/L (ref 101–111)
CO2: 20 mmol/L — AB (ref 22–32)
CREATININE: 0.92 mg/dL (ref 0.44–1.00)
Calcium: 9.3 mg/dL (ref 8.9–10.3)
GFR calc Af Amer: >60 mL/min (ref >60)
GLUCOSE: 92 mg/dL (ref 65–99)
Potassium: 3.9 mmol/L (ref 3.5–5.1)
SODIUM: 137 mmol/L (ref 135–145)
Total Bilirubin: 0.4 mg/dL (ref 0.3–1.2)
Total Protein: 7.4 g/dL (ref 6.5–8.1)

## 2017-12-11 LAB — LIPID PANEL
CHOL/HDL RATIO: 6.4 ratio
Cholesterol: 193 mg/dL (ref 0–200)
HDL: 30 mg/dL — ABNORMAL LOW (ref >40)
LDL CALC: 132 mg/dL — AB (ref 0–99)
Triglycerides: 157 mg/dL — ABNORMAL HIGH (ref <150)
VLDL: 31 mg/dL (ref 0–40)

## 2017-12-16 ENCOUNTER — Encounter: Payer: Self-pay | Admitting: Physician Assistant

## 2017-12-16 ENCOUNTER — Ambulatory Visit: Payer: Self-pay | Admitting: Physician Assistant

## 2017-12-16 VITALS — BP 114/60 | HR 78 | Temp 98.1°F | Ht 65.5 in | Wt 224.0 lb

## 2017-12-16 DIAGNOSIS — F17210 Nicotine dependence, cigarettes, uncomplicated: Secondary | ICD-10-CM

## 2017-12-16 DIAGNOSIS — E785 Hyperlipidemia, unspecified: Secondary | ICD-10-CM

## 2017-12-16 DIAGNOSIS — E669 Obesity, unspecified: Secondary | ICD-10-CM

## 2017-12-16 DIAGNOSIS — M79604 Pain in right leg: Secondary | ICD-10-CM

## 2017-12-16 DIAGNOSIS — M79605 Pain in left leg: Secondary | ICD-10-CM

## 2017-12-16 NOTE — Progress Notes (Signed)
BP 114/60 (BP Location: Left Arm, Patient Position: Sitting, Cuff Size: Large)   Pulse 78   Temp 98.1 F (36.7 C) (Other (Comment))   Ht 5' 5.5" (1.664 m)   Wt 224 lb (101.6 kg)   LMP 11/19/2017 (Approximate)   SpO2 98%   BMI 36.71 kg/m    Subjective:    Patient ID: Jocelyn LargeMelissa A Inlow, female    DOB: 08/15/1981, 37 y.o.   MRN: [id number]  HPI: [name] is a 37 y.o. female presenting on 12/16/2017 for Hyperlipidemia   HPI   Pt complains of severe BLE pain 2 days before onset of her menses.  She says it started 12 years ago after there BTL but it seems to be getting worse.  She says that it hurts so bad she cries and just lays in the bed.  She has tried APAP, IBU, soaking in bathtub.  Pt says the pain resolves with onset of her menses.  Relevant past medical, surgical, family and social history reviewed and updated as indicated. Interim medical history since our last visit reviewed. Allergies and medications reviewed and updated.   Current Outpatient Medications:  .  albuterol (PROVENTIL) (2.5 MG/3ML) 0.083% nebulizer solution, Take 3 mLs (2.5 mg total) by nebulization every 6 (six) hours as needed for wheezing or shortness of breath., Disp: 25 vial, Rfl: 1 .  fenofibrate micronized (LOFIBRA) 134 MG capsule, TAKE 1 CAPSULE BY MOUTH EVERY DAY FOR CHOLESTEROL, Disp: 30 capsule, Rfl: 6 .  simvastatin (ZOCOR) 20 MG tablet, Take 1 tablet (20 mg total) by mouth daily., Disp: 30 tablet, Rfl: 6   Review of Systems  Constitutional: Negative for appetite change, chills, diaphoresis, fatigue, fever and unexpected weight change.  HENT: Negative for congestion, dental problem, drooling, ear pain, facial swelling, hearing loss, mouth sores, sneezing, sore throat, trouble swallowing and voice change.   Eyes: Negative for pain, discharge, redness, itching and visual disturbance.  Respiratory: Negative for cough, choking, shortness of breath and wheezing.   Cardiovascular: Negative for chest  pain, palpitations and leg swelling.  Gastrointestinal: Negative for abdominal pain, blood in stool, constipation, diarrhea and vomiting.  Endocrine: Negative for cold intolerance, heat intolerance and polydipsia.  Genitourinary: Negative for decreased urine volume, dysuria and hematuria.  Musculoskeletal: Negative for arthralgias, back pain and gait problem.  Skin: Negative for rash.  Allergic/Immunologic: Negative for environmental allergies.  Neurological: Negative for seizures, syncope, light-headedness and headaches.  Hematological: Negative for adenopathy.  Psychiatric/Behavioral: Negative for agitation, dysphoric mood and suicidal ideas. The patient is not nervous/anxious.     Per HPI unless specifically indicated above     Objective:    BP 114/60 (BP Location: Left Arm, Patient Position: Sitting, Cuff Size: Large)   Pulse 78   Temp 98.1 F (36.7 C) (Other (Comment))   Ht 5' 5.5" (1.664 m)   Wt 224 lb (101.6 kg)   LMP 11/19/2017 (Approximate)   SpO2 98%   BMI 36.71 kg/m   Wt Readings from Last 3 Encounters:  12/16/17 224 lb (101.6 kg)  11/19/17 216 lb 8 oz (98.2 kg)  09/17/17 217 lb (98.4 kg)    Physical Exam  Constitutional: She is oriented to person, place, and time. She appears well-developed and well-nourished.  HENT:  Head: Normocephalic and atraumatic.  Neck: Neck supple.  Cardiovascular: Normal rate and regular rhythm.  Pulmonary/Chest: Effort normal and breath sounds normal.  Abdominal: Soft. Bowel sounds are normal. She exhibits no mass. There is no hepatosplenomegaly. There is no  tenderness.  Musculoskeletal: She exhibits no edema.       Right hip: She exhibits normal range of motion, normal strength and no tenderness.       Left hip: She exhibits normal range of motion, normal strength and no tenderness.       Lumbar back: Normal. She exhibits normal range of motion, no tenderness, no bony tenderness and no swelling.  Lymphadenopathy:    She has no  cervical adenopathy.  Neurological: She is alert and oriented to person, place, and time.  Skin: Skin is warm and dry.  Psychiatric: She has a normal mood and affect. Her behavior is normal.  Vitals reviewed.   Results for orders placed or performed during the hospital encounter of 12/11/17  Comprehensive metabolic panel  Result Value Ref Range   Sodium 137 135 - 145 mmol/L   Potassium 3.9 3.5 - 5.1 mmol/L   Chloride 107 101 - 111 mmol/L   CO2 20 (L) 22 - 32 mmol/L   Glucose, Bld 92 65 - 99 mg/dL   BUN 14 6 - 20 mg/dL   Creatinine, Ser 6.04 0.44 - 1.00 mg/dL   Calcium 9.3 8.9 - 54.0 mg/dL   Total Protein 7.4 6.5 - 8.1 g/dL   Albumin 4.3 3.5 - 5.0 g/dL   AST 34 15 - 41 U/L   ALT 30 14 - 54 U/L   Alkaline Phosphatase 28 (L) 38 - 126 U/L   Total Bilirubin 0.4 0.3 - 1.2 mg/dL   GFR calc non Af Amer >60 >60 mL/min   GFR calc Af Amer >60 >60 mL/min   Anion gap 10 5 - 15  Lipid panel  Result Value Ref Range   Cholesterol 193 0 - 200 mg/dL   Triglycerides 981 (H) <150 mg/dL   HDL 30 (L) >19 mg/dL   Total CHOL/HDL Ratio 6.4 RATIO   VLDL 31 0 - 40 mg/dL   LDL Cholesterol 147 (H) 0 - 99 mg/dL      Assessment & Plan:   Encounter Diagnoses  Name Primary?  . Hyperlipidemia, unspecified hyperlipidemia type Yes  . Cigarette nicotine dependence, uncomplicated   . Obesity, unspecified classification, unspecified obesity type, unspecified whether serious comorbidity present   . Pain in both lower extremities     -reviewed labs with pt -will discuss with dr Emelda Fear about leg pain prior to menses.  Pt not a candidate for OCP due to smoking -counseled smoking cessation -counseled low-fat diet and exercise -counseled pt on nutrition and need to eat at least 3 times daily, not just qd -pt to continue current medications. -follow up 3 months.  RTO  Sooner prn

## 2018-03-16 ENCOUNTER — Other Ambulatory Visit (HOSPITAL_COMMUNITY)
Admission: RE | Admit: 2018-03-16 | Discharge: 2018-03-16 | Disposition: A | Discharge status: Home / Self Care | Payer: Self-pay | Source: Ambulatory Visit | Attending: Physician Assistant | Admitting: Physician Assistant

## 2018-03-16 DIAGNOSIS — E785 Hyperlipidemia, unspecified: Secondary | ICD-10-CM | POA: Insufficient documentation

## 2018-03-16 LAB — COMPREHENSIVE METABOLIC PANEL
ALBUMIN: 4.2 g/dL (ref 3.5–5.0)
ALT: 23 U/L (ref 14–54)
ANION GAP: 6 (ref 5–15)
AST: 21 U/L (ref 15–41)
Alkaline Phosphatase: 34 U/L — ABNORMAL LOW (ref 38–126)
BUN: 12 mg/dL (ref 6–20)
CHLORIDE: 110 mmol/L (ref 101–111)
CO2: 22 mmol/L (ref 22–32)
Calcium: 9.5 mg/dL (ref 8.9–10.3)
Creatinine, Ser: 0.89 mg/dL (ref 0.44–1.00)
GFR calc non Af Amer: >60 mL/min (ref >60)
GLUCOSE: 87 mg/dL (ref 65–99)
POTASSIUM: 4 mmol/L (ref 3.5–5.1)
SODIUM: 138 mmol/L (ref 135–145)
Total Bilirubin: 0.6 mg/dL (ref 0.3–1.2)
Total Protein: 7.3 g/dL (ref 6.5–8.1)

## 2018-03-16 LAB — LIPID PANEL
CHOL/HDL RATIO: 5.9 ratio
Cholesterol: 178 mg/dL (ref 0–200)
HDL: 30 mg/dL — AB (ref >40)
LDL CALC: 112 mg/dL — AB (ref 0–99)
Triglycerides: 180 mg/dL — ABNORMAL HIGH (ref <150)
VLDL: 36 mg/dL (ref 0–40)

## 2018-03-18 ENCOUNTER — Encounter: Payer: Self-pay | Admitting: Physician Assistant

## 2018-03-18 ENCOUNTER — Ambulatory Visit: Payer: Self-pay | Admitting: Physician Assistant

## 2018-03-18 VITALS — BP 108/62 | HR 69 | Temp 98.5°F | Ht 65.5 in | Wt 213.0 lb

## 2018-03-18 DIAGNOSIS — E785 Hyperlipidemia, unspecified: Secondary | ICD-10-CM

## 2018-03-18 DIAGNOSIS — E669 Obesity, unspecified: Secondary | ICD-10-CM

## 2018-03-18 DIAGNOSIS — F17218 Nicotine dependence, cigarettes, with other nicotine-induced disorders: Secondary | ICD-10-CM

## 2018-03-18 NOTE — Progress Notes (Signed)
BP 108/62 (BP Location: Right Arm, Patient Position: Sitting, Cuff Size: Normal)   Pulse 69   Temp 98.5 F (36.9 C) (Other (Comment))   Ht 5' 5.5" (1.664 m)   Wt 213 lb (96.6 kg)   LMP 02/15/2018 (Approximate)   SpO2 98%   BMI 34.91 kg/m    Subjective:    Patient ID: [name], female    DOB: 05/11/1981, 37 y.o.   MRN: [id number]  HPI: Jocelyn LargeMelissa A Galbreath is a 37 y.o. female presenting on 03/18/2018 for Hyperlipidemia   HPI  Pt is doing well and has no complaints.  She is still smoking.  She is working on weight loss and has lost 11 pounds since her last office visit.  Relevant past medical, surgical, family and social history reviewed and updated as indicated. Interim medical history since our last visit reviewed. Allergies and medications reviewed and updated.   Current Outpatient Medications:  .  albuterol (PROVENTIL) (2.5 MG/3ML) 0.083% nebulizer solution, Take 3 mLs (2.5 mg total) by nebulization every 6 (six) hours as needed for wheezing or shortness of breath., Disp: 25 vial, Rfl: 1 .  fenofibrate micronized (LOFIBRA) 134 MG capsule, TAKE 1 CAPSULE BY MOUTH EVERY DAY FOR CHOLESTEROL, Disp: 30 capsule, Rfl: 6 .  simvastatin (ZOCOR) 20 MG tablet, Take 1 tablet (20 mg total) by mouth daily., Disp: 30 tablet, Rfl: 6   Review of Systems  Constitutional: Negative for appetite change, chills, diaphoresis, fatigue, fever and unexpected weight change.  HENT: Negative for congestion, dental problem, drooling, ear pain, facial swelling, hearing loss, mouth sores, sneezing, sore throat, trouble swallowing and voice change.   Eyes: Negative for pain, discharge, redness, itching and visual disturbance.  Respiratory: Negative for cough, choking, shortness of breath and wheezing.   Cardiovascular: Negative for chest pain, palpitations and leg swelling.  Gastrointestinal: Negative for abdominal pain, blood in stool, constipation, diarrhea and vomiting.  Endocrine: Negative for cold  intolerance, heat intolerance and polydipsia.  Genitourinary: Negative for decreased urine volume, dysuria and hematuria.  Musculoskeletal: Negative for arthralgias, back pain and gait problem.  Skin: Negative for rash.  Allergic/Immunologic: Negative for environmental allergies.  Neurological: Negative for seizures, syncope, light-headedness and headaches.  Hematological: Negative for adenopathy.  Psychiatric/Behavioral: Negative for agitation, dysphoric mood and suicidal ideas. The patient is not nervous/anxious.     Per HPI unless specifically indicated above     Objective:    BP 108/62 (BP Location: Right Arm, Patient Position: Sitting, Cuff Size: Normal)   Pulse 69   Temp 98.5 F (36.9 C) (Other (Comment))   Ht 5' 5.5" (1.664 m)   Wt 213 lb (96.6 kg)   LMP 02/15/2018 (Approximate)   SpO2 98%   BMI 34.91 kg/m   Wt Readings from Last 3 Encounters:  03/18/18 213 lb (96.6 kg)  12/16/17 224 lb (101.6 kg)  11/19/17 216 lb 8 oz (98.2 kg)    Physical Exam  Constitutional: She is oriented to person, place, and time. She appears well-developed and well-nourished.  HENT:  Head: Normocephalic and atraumatic.  Neck: Neck supple.  Cardiovascular: Normal rate and regular rhythm.  Pulmonary/Chest: Effort normal and breath sounds normal.  Abdominal: Soft. Bowel sounds are normal. She exhibits no mass. There is no hepatosplenomegaly. There is no tenderness.  Musculoskeletal: She exhibits no edema.  Lymphadenopathy:    She has no cervical adenopathy.  Neurological: She is alert and oriented to person, place, and time.  Skin: Skin is warm and dry.  Psychiatric:  She has a normal mood and affect. Her behavior is normal.  Vitals reviewed.      Results for orders placed or performed during the hospital encounter of 03/16/18  Lipid panel  Result Value Ref Range   Cholesterol 178 0 - 200 mg/dL   Triglycerides 161 (H) <150 mg/dL   HDL 30 (L) >09 mg/dL   Total CHOL/HDL Ratio 5.9 RATIO    VLDL 36 0 - 40 mg/dL   LDL Cholesterol 604 (H) 0 - 99 mg/dL  Comprehensive metabolic panel  Result Value Ref Range   Sodium 138 135 - 145 mmol/L   Potassium 4.0 3.5 - 5.1 mmol/L   Chloride 110 101 - 111 mmol/L   CO2 22 22 - 32 mmol/L   Glucose, Bld 87 65 - 99 mg/dL   BUN 12 6 - 20 mg/dL   Creatinine, Ser 5.40 0.44 - 1.00 mg/dL   Calcium 9.5 8.9 - 98.1 mg/dL   Total Protein 7.3 6.5 - 8.1 g/dL   Albumin 4.2 3.5 - 5.0 g/dL   AST 21 15 - 41 U/L   ALT 23 14 - 54 U/L   Alkaline Phosphatase 34 (L) 38 - 126 U/L   Total Bilirubin 0.6 0.3 - 1.2 mg/dL   GFR calc non Af Amer >60 >60 mL/min   GFR calc Af Amer >60 >60 mL/min   Anion gap 6 5 - 15      Assessment & Plan:   Encounter Diagnoses  Name Primary?  . Hyperlipidemia, unspecified hyperlipidemia type Yes  . Cigarette nicotine dependence with other nicotine-induced disorder   . Obesity, unspecified classification, unspecified obesity type, unspecified whether serious comorbidity present      -reviewed labs with pt -pt to continue current medication -encouraged pt to continue weight loss efforts with healthy diet and regular exercise -counseled smoking cessation -pt to follow up in 4 months.  RTO sooner prn

## 2018-04-12 ENCOUNTER — Other Ambulatory Visit: Payer: Self-pay | Admitting: Physician Assistant

## 2018-04-12 ENCOUNTER — Telehealth: Payer: Self-pay | Admitting: Student

## 2018-04-12 MED ORDER — MELOXICAM 15 MG PO TABS: 15.0000 mg | ORAL_TABLET | Freq: Every day | ORAL | 0 refills | Status: DC | PRN

## 2018-04-12 NOTE — Telephone Encounter (Signed)
Pt came by the office and was told we can rx meloxicam. [facility] agrees [facility] [place]. Prescription has been sent

## 2018-05-03 ENCOUNTER — Encounter: Payer: Self-pay | Admitting: Physician Assistant

## 2018-05-03 ENCOUNTER — Ambulatory Visit: Payer: Self-pay | Admitting: Physician Assistant

## 2018-05-03 VITALS — BP 120/72 | HR 73 | Temp 97.5°F | Resp 24 | Ht 65.5 in | Wt 213.2 lb

## 2018-05-03 DIAGNOSIS — F17218 Nicotine dependence, cigarettes, with other nicotine-induced disorders: Secondary | ICD-10-CM

## 2018-05-03 DIAGNOSIS — R0602 Shortness of breath: Secondary | ICD-10-CM

## 2018-05-03 DIAGNOSIS — J209 Acute bronchitis, unspecified: Secondary | ICD-10-CM

## 2018-05-03 MED ORDER — ALBUTEROL SULFATE (2.5 MG/3ML) 0.083% IN NEBU
2.5000 mg | INHALATION_SOLUTION | Freq: Once | RESPIRATORY_TRACT | Status: AC
  Administered 2018-05-03: 2.5 mg via RESPIRATORY_TRACT

## 2018-05-03 MED ORDER — PREDNISONE 10 MG PO TABS: ORAL_TABLET | ORAL | 0 refills | Status: AC

## 2018-05-03 NOTE — Patient Instructions (Signed)

## 2018-05-03 NOTE — Progress Notes (Signed)
BP 120/72 (BP Location: Left Arm, Patient Position: Sitting, Cuff Size: [name])   Pulse 73   Temp (!) 97.5 F (36.4 C) (Other (Comment))   Resp (!) 24   Ht 5' 5.5" (1.664 m)   Wt 213 lb 4 oz (96.7 kg)   LMP 04/12/2018 (Exact Date)   SpO2 98%   BMI 34.95 kg/m    Subjective:    Patient ID: [name], female    DOB: 1980-12-02, 37 y.o.   MRN: [id number]  HPI: [name] is a 37 y.o. female presenting on 05/03/2018 for Cough (c/o dry cough, sore throat, sob since Friday night, has tried Mucinex with no relief)   HPI  Chief Complaint  Patient presents with  . Cough    c/o dry cough, sore throat, sob since Friday night, has tried Mucinex with no relief    Saturday took 3 neb treatments and 2 on Sunday and had one once earlier this morning.  No fevers.  Pt still smoking.   Relevant past medical, surgical, family and social history reviewed and updated as indicated. Interim medical history since our last visit reviewed. Allergies and medications reviewed and updated.   Current Outpatient Medications:  .  albuterol (PROVENTIL) (2.5 MG/3ML) 0.083% nebulizer solution, Take 3 mLs (2.5 mg total) by nebulization every 6 (six) hours as needed for wheezing or shortness of breath., Disp: 25 vial, Rfl: 1 .  fenofibrate micronized (LOFIBRA) 134 MG capsule, TAKE 1 CAPSULE BY MOUTH EVERY DAY FOR CHOLESTEROL, Disp: 30 capsule, Rfl: 6 .  meloxicam (MOBIC) 15 MG tablet, Take 1 tablet (15 mg total) by mouth daily as needed for pain., Disp: 30 tablet, Rfl: 0 .  simvastatin (ZOCOR) 20 MG tablet, Take 1 tablet (20 mg total) by mouth daily., Disp: 30 tablet, Rfl: 6   Review of Systems  Constitutional: Positive for appetite change, chills and diaphoresis. Negative for fatigue, fever and unexpected weight change.  HENT: Positive for congestion, sneezing, sore throat and voice change. Negative for dental problem, drooling, ear pain, facial swelling, hearing loss, mouth sores and trouble  swallowing.   Eyes: Negative for pain, discharge, redness, itching and visual disturbance.  Respiratory: Positive for cough, chest tightness, shortness of breath and wheezing. Negative for choking.   Cardiovascular: Negative for chest pain, palpitations and leg swelling.  Gastrointestinal: Negative for abdominal pain, blood in stool, constipation, diarrhea and vomiting.  Endocrine: Negative for cold intolerance, heat intolerance and polydipsia.  Genitourinary: Negative for decreased urine volume, dysuria and hematuria.  Musculoskeletal: Negative for arthralgias, back pain and gait problem.  Skin: Negative for rash.  Allergic/Immunologic: Negative for environmental allergies.  Neurological: Positive for headaches. Negative for seizures, syncope and light-headedness.  Hematological: Negative for adenopathy.  Psychiatric/Behavioral: Negative for agitation, dysphoric mood and suicidal ideas. The patient is not nervous/anxious.     Per HPI unless specifically indicated above     Objective:    BP 120/72 (BP Location: Left Arm, Patient Position: Sitting, Cuff Size: [name])   Pulse 73   Temp (!) 97.5 F (36.4 C) (Other (Comment))   Resp (!) 24   Ht 5' 5.5" (1.664 m)   Wt 213 lb 4 oz (96.7 kg)   LMP 04/12/2018 (Exact Date)   SpO2 98%   BMI 34.95 kg/m   Wt Readings from Last 3 Encounters:  05/03/18 213 lb 4 oz (96.7 kg)  03/18/18 213 lb (96.6 kg)  12/16/17 224 lb (101.6 kg)    Physical Exam  Constitutional: She  is oriented to person, place, and time. She appears well-developed and well-nourished.  HENT:  Head: Normocephalic and atraumatic.  Neck: Neck supple.  Cardiovascular: Normal rate and regular rhythm.  Pulmonary/Chest: Effort normal. No respiratory distress. She has wheezes.  After nebulizer treatment, breath sounds improved and air flow increased  Abdominal: There is no hepatosplenomegaly.  Musculoskeletal: She exhibits no edema.  Lymphadenopathy:    She has no cervical  adenopathy.  Neurological: She is alert and oriented to person, place, and time.  Skin: Skin is warm and dry.  Psychiatric: She has a normal mood and affect. Her behavior is normal.  Vitals reviewed.       Assessment & Plan:   Encounter Diagnoses  Name Primary?  . Acute bronchitis, unspecified organism Yes  . SOB (shortness of breath)   . Cigarette nicotine dependence with other nicotine-induced disorder      -rx prednisone taper -urged pt to stop smoking -pt to continue with nebulizer treatments -pt to RTO if worsens or persists

## 2018-05-10 ENCOUNTER — Other Ambulatory Visit: Payer: Self-pay | Admitting: Physician Assistant

## 2018-05-10 MED ORDER — SIMVASTATIN 20 MG PO TABS: 20.0000 mg | ORAL_TABLET | Freq: Every day | ORAL | 6 refills | Status: DC

## 2018-05-24 ENCOUNTER — Other Ambulatory Visit: Payer: Self-pay | Admitting: Physician Assistant

## 2018-06-30 ENCOUNTER — Encounter: Payer: Self-pay | Admitting: Obstetrics and Gynecology

## 2018-06-30 ENCOUNTER — Other Ambulatory Visit: Payer: Self-pay

## 2018-06-30 ENCOUNTER — Ambulatory Visit (INDEPENDENT_AMBULATORY_CARE_PROVIDER_SITE_OTHER): Payer: Self-pay | Admitting: Obstetrics and Gynecology

## 2018-06-30 VITALS — BP 129/76 | HR 69 | Ht 64.0 in | Wt 214.0 lb

## 2018-06-30 DIAGNOSIS — N946 Dysmenorrhea, unspecified: Secondary | ICD-10-CM | POA: Insufficient documentation

## 2018-06-30 MED ORDER — NORETHINDRONE 0.35 MG PO TABS: 1.0000 | ORAL_TABLET | Freq: Every day | ORAL | 11 refills | Status: DC

## 2018-06-30 NOTE — Progress Notes (Signed)
[facility] Visit  @DATE @            Patient name: [name] MRN [id number]  Date of birth: 06/01/1981  CC & HPI:  [name] is a 37 y.o. female presenting today for a discussion "regarding a hysterectomy", but her complaints are focused on DYSMENORRHEA . She notes that her menstrual cycles cause severe pain to her bilateral hips and legs. She has regular monthly menstrual cycles that last 4-5 days at a time that are intermittently heavy and light. Patient's last menstrual period was 09/07- 06/24/2018, 5 days. During her most recent menstrual cycle she didn't have any clots . Marland Kitchen. She has had a BTL 12 years ago. She has had pap smears completed by her prior GYN specialist, however, she hasn't had any US at this time. Pt reports associated dysmenorrhea. Pt has tried hot baths with no relief of her symptoms. She denies dyspareunia and any other symptoms , bm's normal no bladder issues. She smokes 0.5 PPD of cigarettes and she has an inhaler that she uses for prior hx of bronchitis. She denies any other GYN hx. She has yearly mammograms.   ROS:  ROS +dysmenorrhea +bilateral hip/leg pain due to painful periods -dyspareunia All systems are negative except as noted in the HPI and PMH.    Pertinent History Reviewed:   Reviewed: Significant for hypercholesteremia Medical         Past Medical History:  Diagnosis Date  . Hypercholesteremia                               Surgical Hx:    Past Surgical History:  Procedure Laterality Date  . CHOLECYSTECTOMY N/A 07/13/2014   Procedure: LAPAROSCOPIC CHOLECYSTECTOMY;  Surgeon: Marlane HatcherWilliam S Bradford, MD;  Location: [facility];  Service: General;  Laterality: N/A;  . DENTAL SURGERY  2018   plate top & bottom  . TUBAL LIGATION     Medications: Reviewed & Updated - see associated section                       Current Outpatient Medications:  .  albuterol (PROVENTIL) (2.5 MG/3ML) 0.083% nebulizer solution, Take 3 mLs (2.5 mg total) by  nebulization every 6 (six) hours as needed for wheezing or shortness of breath., Disp: 25 vial, Rfl: 1 .  fenofibrate micronized (LOFIBRA) 134 MG capsule, TAKE 1 CAPSULE BY MOUTH EVERY DAY FOR CHOLESTEROL, Disp: 30 capsule, Rfl: 4 .  meloxicam (MOBIC) 15 MG tablet, Take 1 tablet (15 mg total) by mouth daily as needed for pain., Disp: 30 tablet, Rfl: 0 .  simvastatin (ZOCOR) 20 MG tablet, Take 1 tablet (20 mg total) by mouth daily., Disp: 30 tablet, Rfl: 6   Social History: Reviewed -  reports that she has been smoking cigarettes. She has a 7.50 pack-year smoking history. She has never used smokeless tobacco. 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  PHYSICAL EXAMINATION General appearance - alert, well appearing, and in no distress and oriented to person, place, and time Mental status - alert, oriented to person, place, and time, normal mood, behavior, speech, dress, motor activity, and thought processes  Discussion: 1. Discussed with pt risks and benefits of dysmenorrhea, US, mini-pill, IUD,  and possible surgical interventions to be considered if non-surgical fail.   At end of discussion, pt had opportunity to ask questions and has no further questions at this time.   Specific discussion of dysmenorrhea, [place], mini-pill, IUD, and possible surgical interventions as noted above. Greater than 50% was spent in counseling and coordination of care with the patient.   Total time greater than: 30 minutes.   Assessment & Plan:   A:  1.  Dysmenorrhea   P:  1. Transvaginal US to be completed prior to follow up in 2 weeks 2. Follow up in 2 weeks for internal exam 3. Micronor prescription given today    By signing my name below, I, Soijett Blue, attest that this documentation has been prepared under the direction and in the  presence of Tilda Burrow, MD. Electronically Signed: Soijett Blue, Medical Scribe. 06/30/18. 10:49 AM.  I personally performed the services described in this documentation, which was SCRIBED in my presence. The recorded information has been reviewed and considered accurate. It has been edited as necessary during review. Tilda Burrow, MD

## 2018-07-06 ENCOUNTER — Ambulatory Visit (HOSPITAL_COMMUNITY)
Admission: RE | Admit: 2018-07-06 | Discharge: 2018-07-06 | Disposition: A | Discharge status: Home / Self Care | Payer: Self-pay | Source: Ambulatory Visit | Attending: Obstetrics and Gynecology | Admitting: Obstetrics and Gynecology

## 2018-07-06 DIAGNOSIS — N946 Dysmenorrhea, unspecified: Secondary | ICD-10-CM | POA: Insufficient documentation

## 2018-07-12 ENCOUNTER — Telehealth: Payer: Self-pay | Admitting: Obstetrics and Gynecology

## 2018-07-12 NOTE — Telephone Encounter (Signed)
Patient informed Dr Emelda Fear out of the office until Monday.  Pt states she is able to view the u/s results but doesn't know what it means.  Informed that her U/S would be reviewed at her next visit.  Verbalized understanding.

## 2018-07-19 ENCOUNTER — Encounter: Payer: Self-pay | Admitting: Physician Assistant

## 2018-07-19 ENCOUNTER — Ambulatory Visit: Payer: Self-pay | Admitting: Physician Assistant

## 2018-07-19 ENCOUNTER — Other Ambulatory Visit (HOSPITAL_COMMUNITY)
Admission: RE | Admit: 2018-07-19 | Discharge: 2018-07-19 | Disposition: A | Discharge status: Home / Self Care | Payer: Self-pay | Source: Ambulatory Visit | Attending: Physician Assistant | Admitting: Physician Assistant

## 2018-07-19 VITALS — BP 106/60 | HR 62 | Temp 97.9°F | Ht 64.0 in | Wt 216.0 lb

## 2018-07-19 DIAGNOSIS — E669 Obesity, unspecified: Secondary | ICD-10-CM

## 2018-07-19 DIAGNOSIS — E785 Hyperlipidemia, unspecified: Secondary | ICD-10-CM

## 2018-07-19 DIAGNOSIS — N946 Dysmenorrhea, unspecified: Secondary | ICD-10-CM | POA: Insufficient documentation

## 2018-07-19 DIAGNOSIS — F17200 Nicotine dependence, unspecified, uncomplicated: Secondary | ICD-10-CM

## 2018-07-19 LAB — COMPREHENSIVE METABOLIC PANEL
ALBUMIN: 4.1 g/dL (ref 3.5–5.0)
ALK PHOS: 30 U/L — AB (ref 38–126)
ALT: 32 U/L (ref 0–44)
AST: 29 U/L (ref 15–41)
Anion gap: 6 (ref 5–15)
BILIRUBIN TOTAL: 0.5 mg/dL (ref 0.3–1.2)
BUN: 12 mg/dL (ref 6–20)
CALCIUM: 9.2 mg/dL (ref 8.9–10.3)
CO2: 23 mmol/L (ref 22–32)
CREATININE: 0.96 mg/dL (ref 0.44–1.00)
Chloride: 110 mmol/L (ref 98–111)
GFR calc Af Amer: >60 mL/min (ref >60)
GLUCOSE: 94 mg/dL (ref 70–99)
POTASSIUM: 3.8 mmol/L (ref 3.5–5.1)
SODIUM: 139 mmol/L (ref 135–145)
TOTAL PROTEIN: 7.3 g/dL (ref 6.5–8.1)

## 2018-07-19 LAB — HEMOGLOBIN AND HEMATOCRIT, BLOOD
HCT: 34.6 % — ABNORMAL LOW (ref 36.0–46.0)
Hemoglobin: 11.6 g/dL — ABNORMAL LOW (ref 12.0–15.0)

## 2018-07-19 LAB — LIPID PANEL
CHOL/HDL RATIO: 6.9 ratio
CHOLESTEROL: 186 mg/dL (ref 0–200)
HDL: 27 mg/dL — ABNORMAL LOW (ref >40)
LDL CALC: 117 mg/dL — AB (ref 0–99)
Triglycerides: 210 mg/dL — ABNORMAL HIGH (ref <150)
VLDL: 42 mg/dL — ABNORMAL HIGH (ref 0–40)

## 2018-07-19 NOTE — Progress Notes (Signed)
BP 106/60 (BP Location: Left Arm, Patient Position: Sitting, Cuff Size: Normal)   Pulse 62   Temp 97.9 F (36.6 C)   Ht 5\' 4"  (1.626 m)   Wt 216 lb (98 kg)   LMP 06/19/2018   SpO2 98%   BMI 37.08 kg/m    Subjective:    Patient ID: [name], female    DOB: Jul 08, 1981, 37 y.o.   MRN: [id number]  HPI: [name] is a 37 y.o. female presenting on 07/19/2018 for Hyperlipidemia   HPI  Pt is frustrated with menses continuing despite being put on OCP.  She has follow up with gyn on Thursday this week.   She did not get her labs drawn.  Pt states no SOB.  She is still smoking.   Relevant past medical, surgical, family and social history reviewed and updated as indicated. Interim medical history since our last visit reviewed. Allergies and medications reviewed and updated.   Current Outpatient Medications:  .  albuterol (PROVENTIL) (2.5 MG/3ML) 0.083% nebulizer solution, Take 3 mLs (2.5 mg total) by nebulization every 6 (six) hours as needed for wheezing or shortness of breath., Disp: 25 vial, Rfl: 1 .  fenofibrate micronized (LOFIBRA) 134 MG capsule, TAKE 1 CAPSULE BY MOUTH EVERY DAY FOR CHOLESTEROL, Disp: 30 capsule, Rfl: 4 .  meloxicam (MOBIC) 15 MG tablet, Take 1 tablet (15 mg total) by mouth daily as needed for pain., Disp: 30 tablet, Rfl: 0 .  norethindrone (MICRONOR,CAMILA,ERRIN) 0.35 MG tablet, Take 1 tablet (0.35 mg total) by mouth daily., Disp: 1 Package, Rfl: 11 .  simvastatin (ZOCOR) 20 MG tablet, Take 1 tablet (20 mg total) by mouth daily., Disp: 30 tablet, Rfl: 6   Review of Systems  Constitutional: Negative for appetite change, chills, diaphoresis, fatigue, fever and unexpected weight change.  HENT: Negative for congestion, dental problem, drooling, ear pain, facial swelling, hearing loss, mouth sores, sneezing, sore throat, trouble swallowing and voice change.   Eyes: Negative for pain, discharge, redness, itching and visual disturbance.  Respiratory:  Negative for cough, choking, shortness of breath and wheezing.   Cardiovascular: Negative for chest pain, palpitations and leg swelling.  Gastrointestinal: Negative for abdominal pain, blood in stool, constipation, diarrhea and vomiting.  Endocrine: Negative for cold intolerance, heat intolerance and polydipsia.  Genitourinary: Negative for decreased urine volume, dysuria and hematuria.  Musculoskeletal: Negative for arthralgias, back pain and gait problem.  Skin: Negative for rash.  Allergic/Immunologic: Negative for environmental allergies.  Neurological: Negative for seizures, syncope, light-headedness and headaches.  Hematological: Negative for adenopathy.  Psychiatric/Behavioral: Negative for agitation, dysphoric mood and suicidal ideas. The patient is not nervous/anxious.     Per HPI unless specifically indicated above     Objective:    BP 106/60 (BP Location: Left Arm, Patient Position: Sitting, Cuff Size: Normal)   Pulse 62   Temp 97.9 F (36.6 C)   Ht 5\' 4"  (1.626 m)   Wt 216 lb (98 kg)   LMP 06/19/2018   SpO2 98%   BMI 37.08 kg/m   Wt Readings from Last 3 Encounters:  07/19/18 216 lb (98 kg)  06/30/18 214 lb (97.1 kg)  05/03/18 213 lb 4 oz (96.7 kg)    Physical Exam  Constitutional: She is oriented to person, place, and time. She appears well-developed and well-nourished.  HENT:  Head: Normocephalic and atraumatic.  Neck: Neck supple.  Cardiovascular: Normal rate and regular rhythm.  Pulmonary/Chest: Effort normal and breath sounds normal.  Abdominal: Soft. Bowel  sounds are normal. She exhibits no mass. There is no hepatosplenomegaly. There is no tenderness.  Musculoskeletal: She exhibits no edema.  Lymphadenopathy:    She has no cervical adenopathy.  Neurological: She is alert and oriented to person, place, and time.  Skin: Skin is warm and dry.  Psychiatric: She has a normal mood and affect. Her behavior is normal.  Vitals reviewed.       Assessment &  Plan:   Encounter Diagnoses  Name Primary?  . Hyperlipidemia, unspecified hyperlipidemia type Yes  . Dysmenorrhea   . Tobacco use disorder   . Obesity, unspecified classification, unspecified obesity type, unspecified whether serious comorbidity present     -pt to get labs drawn when she leaves the office today.  Results will be called to her -pt to continue current medications -counseled smoking cessaiton -pt to follow up with gyn as scheduled -pt to follow up here 3 months.  RTO sooner prn

## 2018-07-22 ENCOUNTER — Encounter: Payer: Self-pay | Admitting: Obstetrics and Gynecology

## 2018-07-22 ENCOUNTER — Ambulatory Visit (INDEPENDENT_AMBULATORY_CARE_PROVIDER_SITE_OTHER): Payer: Self-pay | Admitting: Obstetrics and Gynecology

## 2018-07-22 VITALS — BP 119/72 | HR 61 | Wt 214.8 lb

## 2018-07-22 DIAGNOSIS — N946 Dysmenorrhea, unspecified: Secondary | ICD-10-CM

## 2018-07-22 NOTE — Progress Notes (Signed)
Patient ID: [name], female   DOB: 01-Dec-1980, 37 y.o.   MRN: [id number]    [facility] Visit  @DATE @            Patient name: [name] MRN [id number]  Date of birth: 09/22/1981 Followup from [facility] referral for dysmenorrhea CC & HPI:   [name] is a 37 y.o. female presenting today for dysmenorrhea. She has regular, 4-5 day long periods, but they cause severe pain to both of her legs and hips. She was seen on 06/30/2018 and was prescribed Rx Micronor, but is having heavier periods because of it. She is here today to review her [place] from 07/06/2018 and for an internal exam. She smokes and had a BTL 12 years ago.  The patient denies fever, chills or any other symptoms or complaints at this time.   ROS:  ROS  + dysmenorrhea + bilateral leg and hip pain with periods, resolves when not on menses - not interested in IUD (not covered by [facility] program - fever - chills All systems are negative except as noted in the HPI and PMH.   Pertinent History Reviewed:   Reviewed:  Medical         Past Medical History:  Diagnosis Date  . Hypercholesteremia                               Surgical Hx:    Past Surgical History:  Procedure Laterality Date  . CHOLECYSTECTOMY N/A 07/13/2014   Procedure: LAPAROSCOPIC CHOLECYSTECTOMY;  Surgeon: Marlane Hatcher, MD;  Location: [facility];  Service: General;  Laterality: N/A;  . DENTAL SURGERY  2018   plate top & bottom  . TUBAL LIGATION     Medications: Reviewed & Updated - see associated section                       Current Outpatient Medications:  .  albuterol (PROVENTIL) (2.5 MG/3ML) 0.083% nebulizer solution, Take 3 mLs (2.5 mg total) by nebulization every 6 (six) hours as needed for wheezing or shortness of breath., Disp: 25 vial, Rfl: 1 .  fenofibrate micronized (LOFIBRA) 134 MG capsule, TAKE 1 CAPSULE BY MOUTH EVERY DAY FOR CHOLESTEROL, Disp: 30 capsule, Rfl: 4 .  meloxicam (MOBIC) 15 MG tablet, Take 1  tablet (15 mg total) by mouth daily as needed for pain., Disp: 30 tablet, Rfl: 0 .  norethindrone (MICRONOR,CAMILA,ERRIN) 0.35 MG tablet, Take 1 tablet (0.35 mg total) by mouth daily., Disp: 1 Package, Rfl: 11 .  simvastatin (ZOCOR) 20 MG tablet, Take 1 tablet (20 mg total) by mouth daily., Disp: 30 tablet, Rfl: 6  Social History: Reviewed -  reports that she has been smoking cigarettes. She has a 7.50 pack-year smoking history. She has never used smokeless tobacco.  Objective Findings:  Vitals: Blood pressure 119/72, pulse 61, weight 214 lb 12.8 oz (97.4 kg).  PHYSICAL EXAMINATION General appearance - alert, well appearing, and in no distress, oriented to person, place, and time and overweight, Extensive arm and neck and facial tattoos. Mental status - alert, oriented to person, place, and time, normal mood, behavior, speech, dress, motor activity, and thought processes, affect appropriate to mood Abd nontender PELVIC Vagina - long, good support Cervix - multiparous Uterus - anterior, little bit of pain with palpation (3-4/10 pain scale). More discomfort around uterus than actual uterus contact No guarding or  rebound with bimanual Assessment & Plan:   A:  1. Normal uterus and cervix (via [place]), confirmed on bimanual 2. Dysmenorrhea  P:  1. Stop Rx Micronor for 5 days, then restart. With next breakthru bleeding on Micronor 2.   Add Rx Megace if bleeding starts a second time after restarting 3.  3 month f/u Review of menses and or pain calendar By signing my name below, I, Pietro Cassis, attest that this documentation has been prepared under the direction and in the presence of Tilda Burrow, MD. Electronically Signed: Pietro Cassis, Medical Scribe. 07/22/18. 9:35 AM.  I personally performed the services described in this documentation, which was SCRIBED in my presence. The recorded information has been reviewed and considered accurate. It has been edited as necessary during  review. Tilda Burrow, MD

## 2018-08-09 ENCOUNTER — Other Ambulatory Visit: Payer: Self-pay | Admitting: Obstetrics and Gynecology

## 2018-08-09 ENCOUNTER — Telehealth: Payer: Self-pay | Admitting: Obstetrics and Gynecology

## 2018-08-09 MED ORDER — MEGESTROL ACETATE 40 MG PO TABS: 40.0000 mg | ORAL_TABLET | Freq: Three times a day (TID) | ORAL | 2 refills | Status: DC

## 2018-08-09 NOTE — Progress Notes (Signed)
Rx Megace escribed

## 2018-08-09 NOTE — Telephone Encounter (Signed)
Patient states she stopped the Micronor for 5 days, then restarted.  She is having breakthrough bleeding again. Last visit note states Megace will be added.  Please advise.

## 2018-08-09 NOTE — Telephone Encounter (Signed)
Pt was to call Ferg after she stopped the mini pill and start back to see if bleeding stopped , she done that and he told her to call and he would add a pill to it/ please advise

## 2018-08-25 ENCOUNTER — Other Ambulatory Visit: Payer: Self-pay | Admitting: Physician Assistant

## 2018-08-25 DIAGNOSIS — E785 Hyperlipidemia, unspecified: Secondary | ICD-10-CM

## 2018-09-12 ENCOUNTER — Other Ambulatory Visit: Payer: Self-pay | Admitting: Physician Assistant

## 2018-10-15 ENCOUNTER — Other Ambulatory Visit (HOSPITAL_COMMUNITY)
Admission: RE | Admit: 2018-10-15 | Discharge: 2018-10-15 | Disposition: A | Discharge status: Home / Self Care | Payer: Self-pay | Source: Ambulatory Visit | Attending: Physician Assistant | Admitting: Physician Assistant

## 2018-10-15 DIAGNOSIS — E785 Hyperlipidemia, unspecified: Secondary | ICD-10-CM | POA: Insufficient documentation

## 2018-10-15 LAB — LIPID PANEL
Cholesterol: 173 mg/dL (ref 0–200)
HDL: 29 mg/dL — AB (ref >40)
LDL Cholesterol: 123 mg/dL — ABNORMAL HIGH (ref 0–99)
Total CHOL/HDL Ratio: 6 RATIO
Triglycerides: 105 mg/dL (ref <150)
VLDL: 21 mg/dL (ref 0–40)

## 2018-10-15 LAB — COMPREHENSIVE METABOLIC PANEL
ALT: 30 U/L (ref 0–44)
ANION GAP: 5 (ref 5–15)
AST: 24 U/L (ref 15–41)
Albumin: 4.2 g/dL (ref 3.5–5.0)
Alkaline Phosphatase: 28 U/L — ABNORMAL LOW (ref 38–126)
BUN: 13 mg/dL (ref 6–20)
CO2: 19 mmol/L — ABNORMAL LOW (ref 22–32)
Calcium: 9.2 mg/dL (ref 8.9–10.3)
Chloride: 115 mmol/L — ABNORMAL HIGH (ref 98–111)
Creatinine, Ser: 1 mg/dL (ref 0.44–1.00)
GFR calc Af Amer: >60 mL/min (ref >60)
GFR calc non Af Amer: >60 mL/min (ref >60)
Glucose, Bld: 92 mg/dL (ref 70–99)
POTASSIUM: 4 mmol/L (ref 3.5–5.1)
Sodium: 139 mmol/L (ref 135–145)
Total Bilirubin: 0.3 mg/dL (ref 0.3–1.2)
Total Protein: 7.3 g/dL (ref 6.5–8.1)

## 2018-10-19 ENCOUNTER — Ambulatory Visit: Payer: Self-pay | Admitting: Physician Assistant

## 2018-10-19 ENCOUNTER — Encounter: Payer: Self-pay | Admitting: Physician Assistant

## 2018-10-19 VITALS — BP 116/60 | HR 70 | Temp 97.7°F | Ht 64.0 in | Wt 218.0 lb

## 2018-10-19 DIAGNOSIS — E785 Hyperlipidemia, unspecified: Secondary | ICD-10-CM

## 2018-10-19 DIAGNOSIS — F17200 Nicotine dependence, unspecified, uncomplicated: Secondary | ICD-10-CM

## 2018-10-19 DIAGNOSIS — J329 Chronic sinusitis, unspecified: Secondary | ICD-10-CM

## 2018-10-19 DIAGNOSIS — E669 Obesity, unspecified: Secondary | ICD-10-CM

## 2018-10-19 MED ORDER — AMOXICILLIN 500 MG PO CAPS: 500.0000 mg | ORAL_CAPSULE | Freq: Three times a day (TID) | ORAL | 0 refills | Status: DC

## 2018-10-19 NOTE — Progress Notes (Signed)
BP 116/60 (BP Location: Left Arm, Patient Position: Sitting, Cuff Size: Normal)   Pulse 70   Temp 97.7 F (36.5 C)   Ht 5\' 4"  (1.626 m)   Wt 218 lb (98.9 kg)   SpO2 97%   BMI 37.42 kg/m    Subjective:    Patient ID: [name], female    DOB: Aug 23, 1981, 38 y.o.   MRN: [id number]  HPI: [name] is a 38 y.o. female presenting on 10/19/2018 for Hyperlipidemia   HPI   Pt states HA x 2 weeks.  Starts around 1:30 or 2:30 in the afternoon every day.  Before that she feels fine.  She is taking exedrin migaine that does not help.  She says the HA will last until she goes to bed later that night (ie it lasts the rest of the day).  The HA is all over but it seems to start on the Left front area.    She had her eyes checked about 6 or 7 months ago and they were fine.    She wonders if it's the OCP which she started about 2 months ago.  (per Epic she started that in October)  Relevant past medical, surgical, family and social history reviewed and updated as indicated. Interim medical history since our last visit reviewed. Allergies and medications reviewed and updated.    Current Outpatient Medications:  .  fenofibrate micronized (LOFIBRA) 134 MG capsule, TAKE 1 CAPSULE BY MOUTH EVERY DAY FOR CHOLESTEROL, Disp: 30 capsule, Rfl: 4 .  IRON PO, Take 1 tablet by mouth daily., Disp: , Rfl:  .  megestrol (MEGACE) 40 MG tablet, Take 1 tablet (40 mg total) by mouth 3 (three) times daily. Until bleeding stops, then once daily until followup, Disp: 45 tablet, Rfl: 2 .  meloxicam (MOBIC) 15 MG tablet, TAKE 1 TABLET BY MOUTH ONCE DAILY AS NEEDED FOR PAIN, Disp: 30 tablet, Rfl: 0 .  norethindrone (MICRONOR,CAMILA,ERRIN) 0.35 MG tablet, Take 1 tablet (0.35 mg total) by mouth daily., Disp: 1 Package, Rfl: 11 .  simvastatin (ZOCOR) 20 MG tablet, Take 1 tablet (20 mg total) by mouth daily., Disp: 30 tablet, Rfl: 6 .  albuterol (PROVENTIL) (2.5 MG/3ML) 0.083% nebulizer solution, Take 3 mLs (2.5 mg  total) by nebulization every 6 (six) hours as needed for wheezing or shortness of breath. (Patient not taking: Reported on 10/19/2018), Disp: 25 vial, Rfl: 1    Review of Systems  Constitutional: Negative for appetite change, chills, diaphoresis, fatigue, fever and unexpected weight change.  HENT: Negative for congestion, dental problem, drooling, ear pain, facial swelling, hearing loss, mouth sores, sneezing, sore throat, trouble swallowing and voice change.   Eyes: Negative for pain, discharge, redness, itching and visual disturbance.  Respiratory: Negative for cough, choking, shortness of breath and wheezing.   Cardiovascular: Negative for chest pain, palpitations and leg swelling.  Gastrointestinal: Negative for abdominal pain, blood in stool, constipation, diarrhea and vomiting.  Endocrine: Negative for cold intolerance, heat intolerance and polydipsia.  Genitourinary: Negative for decreased urine volume, dysuria and hematuria.  Musculoskeletal: Negative for arthralgias, back pain and gait problem.  Skin: Negative for rash.  Allergic/Immunologic: Negative for environmental allergies.  Neurological: Positive for headaches. Negative for seizures, syncope and light-headedness.  Hematological: Negative for adenopathy.  Psychiatric/Behavioral: Negative for agitation, dysphoric mood and suicidal ideas. The patient is not nervous/anxious.     Per HPI unless specifically indicated above     Objective:    BP 116/60 (BP Location: Left  Arm, Patient Position: Sitting, Cuff Size: Normal)   Pulse 70   Temp 97.7 F (36.5 C)   Ht 5\' 4"  (1.626 m)   Wt 218 lb (98.9 kg)   SpO2 97%   BMI 37.42 kg/m   Wt Readings from Last 3 Encounters:  10/19/18 218 lb (98.9 kg)  07/22/18 214 lb 12.8 oz (97.4 kg)  07/19/18 216 lb (98 kg)    Physical Exam Vitals signs reviewed.  Constitutional:      Appearance: She is well-developed.  HENT:     Head: Normocephalic and atraumatic.     Right Ear: Hearing,  tympanic membrane, ear canal and external ear normal.     Left Ear: Hearing, tympanic membrane, ear canal and external ear normal.     Nose: Mucosal edema and congestion present.     Left Sinus: Frontal sinus tenderness present.     Mouth/Throat:     Pharynx: Uvula midline. No oropharyngeal exudate.  Neck:     Musculoskeletal: Neck supple.  Cardiovascular:     Rate and Rhythm: Normal rate and regular rhythm.  Pulmonary:     Effort: Pulmonary effort is normal.     Breath sounds: Normal breath sounds. No wheezing.  Abdominal:     General: Bowel sounds are normal.     Palpations: Abdomen is soft. There is no mass.     Tenderness: There is no abdominal tenderness.  Lymphadenopathy:     Cervical: No cervical adenopathy.  Skin:    General: Skin is warm and dry.  Neurological:     Mental Status: She is alert and oriented to person, place, and time.  Psychiatric:        Behavior: Behavior normal.     Results for orders placed or performed during the hospital encounter of 10/15/18  Lipid panel  Result Value Ref Range   Cholesterol 173 0 - 200 mg/dL   Triglycerides 161105 <096<150 mg/dL   HDL 29 (L) >04>40 mg/dL   Total CHOL/HDL Ratio 6.0 RATIO   VLDL 21 0 - 40 mg/dL   LDL Cholesterol 540123 (H) 0 - 99 mg/dL  Comprehensive metabolic panel  Result Value Ref Range   Sodium 139 135 - 145 mmol/L   Potassium 4.0 3.5 - 5.1 mmol/L   Chloride 115 (H) 98 - 111 mmol/L   CO2 19 (L) 22 - 32 mmol/L   Glucose, Bld 92 70 - 99 mg/dL   BUN 13 6 - 20 mg/dL   Creatinine, Ser 9.811.00 0.44 - 1.00 mg/dL   Calcium 9.2 8.9 - 19.110.3 mg/dL   Total Protein 7.3 6.5 - 8.1 g/dL   Albumin 4.2 3.5 - 5.0 g/dL   AST 24 15 - 41 U/L   ALT 30 0 - 44 U/L   Alkaline Phosphatase 28 (L) 38 - 126 U/L   Total Bilirubin 0.3 0.3 - 1.2 mg/dL   GFR calc non Af Amer >60 >60 mL/min   GFR calc Af Amer >60 >60 mL/min   Anion gap 5 5 - 15      Assessment & Plan:    Encounter Diagnoses  Name Primary?  . Hyperlipidemia, unspecified  hyperlipidemia type Yes  . Tobacco use disorder   . Obesity, unspecified classification, unspecified obesity type, unspecified whether serious comorbidity present   . Sinusitis, unspecified chronicity, unspecified location     -reviewed labs ith pt -will treat with amoxil for sinusitis.  Pt counseled to RTO if HA persists -counseled smoking cessation -pt to continue current medications -  pt to follow up 3 months.  RTO sooner prn

## 2018-10-20 ENCOUNTER — Ambulatory Visit (INDEPENDENT_AMBULATORY_CARE_PROVIDER_SITE_OTHER): Payer: Self-pay | Admitting: Obstetrics and Gynecology

## 2018-10-20 ENCOUNTER — Encounter: Payer: Self-pay | Admitting: Obstetrics and Gynecology

## 2018-10-20 VITALS — BP 113/75 | HR 75 | Ht 65.0 in | Wt 216.0 lb

## 2018-10-20 DIAGNOSIS — Z8742 Personal history of other diseases of the female genital tract: Secondary | ICD-10-CM

## 2018-10-20 MED ORDER — NORETHINDRONE 0.35 MG PO TABS: 1.0000 | ORAL_TABLET | Freq: Every day | ORAL | 11 refills | Status: DC

## 2018-10-20 MED ORDER — MEGESTROL ACETATE 40 MG PO TABS: 40.0000 mg | ORAL_TABLET | Freq: Three times a day (TID) | ORAL | 2 refills | Status: DC

## 2018-10-20 NOTE — Progress Notes (Signed)
Patient ID: [name], female   DOB: 01/12/1981, 38 y.o.   MRN: [id number]    [facility] Visit  @DATE @            Patient name: [name] MRN [id number]  Date of birth: 1981-09-26  CC & HPI:  [name] is a 38 y.o. female presenting today for medication follow-up for micronoir and megace.  Medication is controlling the bleeding.  ROS:  ROS   Pertinent History Reviewed:   Reviewed:  Medical         Past Medical History:  Diagnosis Date  . Hypercholesteremia                               Surgical Hx:    Past Surgical History:  Procedure Laterality Date  . CHOLECYSTECTOMY N/A 07/13/2014   Procedure: LAPAROSCOPIC CHOLECYSTECTOMY;  Surgeon: Marlane Hatcher, MD;  Location: [facility];  Service: General;  Laterality: N/A;  . DENTAL SURGERY  2018   plate top & bottom  . TUBAL LIGATION     Medications: Reviewed & Updated - see associated section                       Current Outpatient Medications:  .  albuterol (PROVENTIL) (2.5 MG/3ML) 0.083% nebulizer solution, Take 3 mLs (2.5 mg total) by nebulization every 6 (six) hours as needed for wheezing or shortness of breath. (Patient not taking: Reported on 10/19/2018), Disp: 25 vial, Rfl: 1 .  amoxicillin (AMOXIL) 500 MG capsule, Take 1 capsule (500 mg total) by mouth 3 (three) times daily., Disp: 30 capsule, Rfl: 0 .  fenofibrate micronized (LOFIBRA) 134 MG capsule, TAKE 1 CAPSULE BY MOUTH EVERY DAY FOR CHOLESTEROL, Disp: 30 capsule, Rfl: 4 .  IRON PO, Take 1 tablet by mouth daily., Disp: , Rfl:  .  megestrol (MEGACE) 40 MG tablet, Take 1 tablet (40 mg total) by mouth 3 (three) times daily. Until bleeding stops, then once daily until followup, Disp: 45 tablet, Rfl: 2 .  meloxicam (MOBIC) 15 MG tablet, TAKE 1 TABLET BY MOUTH ONCE DAILY AS NEEDED FOR PAIN, Disp: 30 tablet, Rfl: 0 .  norethindrone (MICRONOR,CAMILA,ERRIN) 0.35 MG tablet, Take 1 tablet (0.35 mg total) by mouth daily., Disp: 1 Package, Rfl: 11 .  simvastatin  (ZOCOR) 20 MG tablet, Take 1 tablet (20 mg total) by mouth daily., Disp: 30 tablet, Rfl: 6   Social History: Reviewed -  reports that she has been smoking cigarettes. She has a 7.50 pack-year smoking history. She has never used smokeless tobacco.  Objective Findings:  Vitals: There were no vitals taken for this visit.  PHYSICAL EXAMINATION General appearance - alert, well appearing, and in no distress and oriented to person, place, and time Mental status - alert, oriented to person, place, and time, normal mood, behavior, speech, dress, motor activity, and thought processes, affect appropriate to mood  PELVIC Not done    Assessment & Plan:   A:  1. Heavy menses managment  P:  1.  Rx megace, once daily for bleeding 2. Rx micronor    By signing my name below, I, Arnette Norris, attest that this documentation has been prepared under the direction and in the presence of Tilda Burrow, MD. Electronically Signed: Arnette Norris Medical Scribe. 10/20/18. 10:55 AM.  I personally performed the services described in this documentation, which was SCRIBED in my presence. The  recorded information has been reviewed and considered accurate. It has been edited as necessary during review. Jonnie Kind, MD

## 2018-10-22 ENCOUNTER — Ambulatory Visit: Payer: Self-pay | Admitting: Obstetrics and Gynecology

## 2018-10-28 ENCOUNTER — Other Ambulatory Visit: Payer: Self-pay | Admitting: Physician Assistant

## 2018-10-28 MED ORDER — FENOFIBRATE 145 MG PO TABS: 145.0000 mg | ORAL_TABLET | Freq: Every day | ORAL | 4 refills | Status: DC

## 2018-11-16 ENCOUNTER — Encounter: Payer: Self-pay | Admitting: Physician Assistant

## 2018-11-16 ENCOUNTER — Ambulatory Visit: Payer: Self-pay | Admitting: Physician Assistant

## 2018-11-16 VITALS — BP 102/70 | HR 80 | Temp 97.9°F | Ht 65.0 in | Wt 219.2 lb

## 2018-11-16 DIAGNOSIS — R059 Cough, unspecified: Secondary | ICD-10-CM

## 2018-11-16 DIAGNOSIS — R05 Cough: Secondary | ICD-10-CM

## 2018-11-16 DIAGNOSIS — J00 Acute nasopharyngitis [common cold]: Secondary | ICD-10-CM

## 2018-11-16 MED ORDER — ALBUTEROL SULFATE (2.5 MG/3ML) 0.083% IN NEBU: INHALATION_SOLUTION | RESPIRATORY_TRACT | 3 refills | Status: DC

## 2018-11-16 MED ORDER — ALBUTEROL SULFATE (2.5 MG/3ML) 0.083% IN NEBU
2.5000 mg | INHALATION_SOLUTION | Freq: Once | RESPIRATORY_TRACT | Status: AC
  Administered 2018-11-16: 2.5 mg via RESPIRATORY_TRACT

## 2018-11-16 NOTE — Progress Notes (Signed)
BP 102/70 (BP Location: Left Arm, Patient Position: Sitting, Cuff Size: Normal)   Pulse 80   Temp 97.9 F (36.6 C)   Ht 5\' 5"  (1.651 m)   Wt 219 lb 4 oz (99.5 kg)   SpO2 97%   BMI 36.49 kg/m    Subjective:    Patient ID: Jocelyn LargeMelissa A Makepeace, female    DOB: 03/03/1981, 38 y.o.   MRN: [id number]  HPI: [name] is a 38 y.o. female presenting on 11/16/2018 for Cough (feels like mucous is in throat, runny nose, sneezing, watery eyes, HA, sore throat. sx began on Saturday 11-13-2018. pt has taken tylenol which has not helped.)   HPI    Chief Complaint  Patient presents with  . Cough    feels like mucous is in throat, runny nose, sneezing, watery eyes, HA, sore throat. sx began on Saturday 11-13-2018. pt has taken tylenol which has not helped.     No body aches or fevers.  Pt continues to smoke.   Pt just completed course of antibiotics given in January for sinusitis.   Relevant past medical, surgical, family and social history reviewed and updated as indicated. Interim medical history since our last visit reviewed. Allergies and medications reviewed and updated.   Current Outpatient Medications:  .  fenofibrate (TRICOR) 145 MG tablet, Take 1 tablet (145 mg total) by mouth daily., Disp: 30 tablet, Rfl: 4 .  IRON PO, Take 1 tablet by mouth daily., Disp: , Rfl:  .  megestrol (MEGACE) 40 MG tablet, Take 1 tablet (40 mg total) by mouth 3 (three) times daily. Until bleeding stops, then once daily until followup, Disp: 45 tablet, Rfl: 2 .  meloxicam (MOBIC) 15 MG tablet, TAKE 1 TABLET BY MOUTH ONCE DAILY AS NEEDED FOR PAIN, Disp: 30 tablet, Rfl: 0 .  norethindrone (MICRONOR,CAMILA,ERRIN) 0.35 MG tablet, Take 1 tablet (0.35 mg total) by mouth daily., Disp: 1 Package, Rfl: 11 .  simvastatin (ZOCOR) 20 MG tablet, Take 1 tablet (20 mg total) by mouth daily., Disp: 30 tablet, Rfl: 6    Review of Systems  Constitutional: Positive for chills and diaphoresis. Negative for appetite change,  fatigue, fever and unexpected weight change.  HENT: Positive for congestion, sneezing and sore throat. Negative for dental problem, drooling, ear pain, facial swelling, hearing loss, mouth sores, trouble swallowing and voice change.   Eyes: Negative for pain, discharge, redness, itching and visual disturbance.  Respiratory: Negative for cough, choking, shortness of breath and wheezing.   Cardiovascular: Negative for chest pain, palpitations and leg swelling.  Gastrointestinal: Negative for abdominal pain, blood in stool, constipation, diarrhea and vomiting.  Endocrine: Negative for cold intolerance, heat intolerance and polydipsia.  Genitourinary: Negative for decreased urine volume, dysuria and hematuria.  Musculoskeletal: Negative for arthralgias, back pain and gait problem.  Skin: Negative for rash.  Allergic/Immunologic: Negative for environmental allergies.  Neurological: Positive for headaches. Negative for seizures, syncope and light-headedness.  Hematological: Negative for adenopathy.  Psychiatric/Behavioral: Negative for agitation, dysphoric mood and suicidal ideas. The patient is not nervous/anxious.     Per HPI unless specifically indicated above     Objective:    BP 102/70 (BP Location: Left Arm, Patient Position: Sitting, Cuff Size: Normal)   Pulse 80   Temp 97.9 F (36.6 C)   Ht 5\' 5"  (1.651 m)   Wt 219 lb 4 oz (99.5 kg)   SpO2 97%   BMI 36.49 kg/m   Wt Readings from Last 3 Encounters:  11/16/18  219 lb 4 oz (99.5 kg)  10/20/18 216 lb (98 kg)  10/19/18 218 lb (98.9 kg)    Physical Exam Vitals signs reviewed.  Constitutional:      Appearance: She is well-developed.  HENT:     Head: Normocephalic and atraumatic.     Right Ear: Hearing, tympanic membrane, ear canal and external ear normal.     Left Ear: Hearing, tympanic membrane, ear canal and external ear normal.     Nose: Nose normal.     Mouth/Throat:     Pharynx: Uvula midline. No oropharyngeal exudate.   Neck:     Musculoskeletal: Neck supple.  Cardiovascular:     Rate and Rhythm: Normal rate and regular rhythm.  Pulmonary:     Effort: Pulmonary effort is normal.     Breath sounds: Normal breath sounds. No wheezing.  Lymphadenopathy:     Cervical: No cervical adenopathy.  Skin:    General: Skin is warm and dry.  Neurological:     Mental Status: She is alert and oriented to person, place, and time.  Psychiatric:        Behavior: Behavior normal.         Assessment & Plan:   Encounter Diagnoses  Name Primary?  . Cough Yes  . Acute nasopharyngitis     -nebulizer treatment given in office and pt reported subjective improvement -refilled rx nebulizer albuterol for pt to use -she is to use OTCs prn -rest. Fluids. Avoid smoking -pt to follow up as shceduled.  RTO sooner prn

## 2018-11-30 ENCOUNTER — Ambulatory Visit: Payer: Self-pay | Admitting: Physician Assistant

## 2018-11-30 ENCOUNTER — Encounter: Payer: Self-pay | Admitting: Physician Assistant

## 2018-11-30 VITALS — BP 128/66 | HR 74 | Temp 97.9°F | Ht 65.0 in | Wt 222.8 lb

## 2018-11-30 DIAGNOSIS — R05 Cough: Secondary | ICD-10-CM

## 2018-11-30 DIAGNOSIS — J209 Acute bronchitis, unspecified: Secondary | ICD-10-CM

## 2018-11-30 DIAGNOSIS — R059 Cough, unspecified: Secondary | ICD-10-CM

## 2018-11-30 MED ORDER — BENZONATATE 100 MG PO CAPS: ORAL_CAPSULE | ORAL | 3 refills | Status: DC

## 2018-11-30 MED ORDER — ALBUTEROL SULFATE (2.5 MG/3ML) 0.083% IN NEBU
2.5000 mg | INHALATION_SOLUTION | Freq: Once | RESPIRATORY_TRACT | Status: AC
  Administered 2018-11-30: 2.5 mg via RESPIRATORY_TRACT

## 2018-11-30 MED ORDER — PREDNISONE 20 MG PO TABS: 20.0000 mg | ORAL_TABLET | Freq: Two times a day (BID) | ORAL | 5 refills | Status: DC

## 2018-11-30 MED ORDER — AZITHROMYCIN 250 MG PO TABS: ORAL_TABLET | ORAL | 0 refills | Status: DC

## 2018-11-30 NOTE — Progress Notes (Signed)
BP 128/66 (BP Location: Left Arm, Patient Position: Sitting, Cuff Size: Normal)   Pulse 74   Temp 97.9 F (36.6 C)   Ht 5\' 5"  (1.651 m)   Wt 222 lb 12 oz (101 kg)   SpO2 99%   BMI 37.07 kg/m    Subjective:    Patient ID: [name], female    DOB: 01/21/1981, 38 y.o.   MRN: [id number]  HPI: [name] is a 38 y.o. female presenting on 11/30/2018 for Cough (ongoing from last time she was here on 11-16-18. pt states she thinks her cough has gotten worse than last time she was here. pt states cough worse at night. some nasal congestion, sneezing, HA, little L sided sore throat, chills, and pt states she feels she has mucous in her throat and is unable to cough it up. pt has been using alka-seltzer plus cold and cough which does not help.)   HPI  Chief Complaint  Patient presents with  . Cough    ongoing from last time she was here on 11-16-18. pt states she thinks her cough has gotten worse than last time she was here. pt states cough worse at night. some nasal congestion, sneezing, HA, little L sided sore throat, chills, and pt states she feels she has mucous in her throat and is unable to cough it up. pt has been using alka-seltzer plus cold and cough which does not help.    Pt has had no fevers.  She is a smoker.   Relevant past medical, surgical, family and social history reviewed and updated as indicated. Interim medical history since our last visit reviewed. Allergies and medications reviewed and updated.  Review of Systems  Constitutional: Positive for chills and diaphoresis. Negative for appetite change, fatigue, fever and unexpected weight change.  HENT: Positive for congestion, sneezing and sore throat. Negative for dental problem, drooling, ear pain, facial swelling, hearing loss, mouth sores, trouble swallowing and voice change.   Eyes: Negative for pain, discharge, redness, itching and visual disturbance.  Respiratory: Positive for cough and shortness of breath.  Negative for choking and wheezing.   Cardiovascular: Negative for chest pain, palpitations and leg swelling.  Gastrointestinal: Negative for abdominal pain, blood in stool, constipation, diarrhea and vomiting.  Endocrine: Negative for cold intolerance, heat intolerance and polydipsia.  Genitourinary: Negative for decreased urine volume, dysuria and hematuria.  Musculoskeletal: Negative for arthralgias, back pain and gait problem.  Skin: Negative for rash.  Allergic/Immunologic: Negative for environmental allergies.  Neurological: Negative for seizures, syncope, light-headedness and headaches.  Hematological: Negative for adenopathy.  Psychiatric/Behavioral: Negative for agitation, dysphoric mood and suicidal ideas. The patient is not nervous/anxious.     Per HPI unless specifically indicated above     Objective:    BP 128/66 (BP Location: Left Arm, Patient Position: Sitting, Cuff Size: Normal)   Pulse 74   Temp 97.9 F (36.6 C)   Ht 5\' 5"  (1.651 m)   Wt 222 lb 12 oz (101 kg)   SpO2 99%   BMI 37.07 kg/m   Wt Readings from Last 3 Encounters:  11/30/18 222 lb 12 oz (101 kg)  11/16/18 219 lb 4 oz (99.5 kg)  10/20/18 216 lb (98 kg)    Physical Exam Vitals signs reviewed.  Constitutional:      Appearance: She is well-developed.  HENT:     Head: Normocephalic and atraumatic.     Right Ear: Hearing, tympanic membrane, ear canal and external ear normal.  Left Ear: Hearing, tympanic membrane, ear canal and external ear normal.     Nose: Nose normal.     Mouth/Throat:     Pharynx: Uvula midline. No oropharyngeal exudate.  Neck:     Musculoskeletal: Neck supple.  Cardiovascular:     Rate and Rhythm: Normal rate and regular rhythm.  Pulmonary:     Effort: Pulmonary effort is normal.     Breath sounds: Normal breath sounds. No wheezing.  Lymphadenopathy:     Cervical: No cervical adenopathy.  Skin:    General: Skin is warm and dry.  Neurological:     Mental Status: She is  alert and oriented to person, place, and time.  Psychiatric:        Behavior: Behavior normal.         Assessment & Plan:    Encounter Diagnoses  Name Primary?  . Cough Yes  . Acute bronchitis, unspecified organism    -rx zpack, prednisone and tessalon.  She is to use her nebulizer as needed -pt counseled to avoid smoking -rest, fluids -pt to RTO if worsens or persists.

## 2018-12-06 ENCOUNTER — Emergency Department (HOSPITAL_COMMUNITY): Payer: Self-pay

## 2018-12-06 ENCOUNTER — Emergency Department (HOSPITAL_COMMUNITY)
Admission: EM | Admit: 2018-12-06 | Discharge: 2018-12-06 | Disposition: A | Discharge status: Home / Self Care | Payer: Self-pay | Attending: Emergency Medicine | Admitting: Emergency Medicine

## 2018-12-06 ENCOUNTER — Other Ambulatory Visit: Payer: Self-pay

## 2018-12-06 ENCOUNTER — Encounter (HOSPITAL_COMMUNITY): Payer: Self-pay | Admitting: Emergency Medicine

## 2018-12-06 DIAGNOSIS — J209 Acute bronchitis, unspecified: Secondary | ICD-10-CM | POA: Insufficient documentation

## 2018-12-06 DIAGNOSIS — Z79899 Other long term (current) drug therapy: Secondary | ICD-10-CM | POA: Insufficient documentation

## 2018-12-06 DIAGNOSIS — Z9049 Acquired absence of other specified parts of digestive tract: Secondary | ICD-10-CM | POA: Insufficient documentation

## 2018-12-06 DIAGNOSIS — F17210 Nicotine dependence, cigarettes, uncomplicated: Secondary | ICD-10-CM | POA: Insufficient documentation

## 2018-12-06 MED ORDER — DIPHENHYDRAMINE HCL 12.5 MG/5ML PO ELIX
12.5000 mg | ORAL_SOLUTION | Freq: Once | ORAL | Status: AC
  Administered 2018-12-06: 12.5 mg via ORAL
  Filled 2018-12-06: qty 5

## 2018-12-06 MED ORDER — DOXYCYCLINE HYCLATE 100 MG PO CAPS: 100.0000 mg | ORAL_CAPSULE | Freq: Two times a day (BID) | ORAL | 0 refills | Status: DC

## 2018-12-06 MED ORDER — DEXAMETHASONE 4 MG PO TABS: 4.0000 mg | ORAL_TABLET | Freq: Two times a day (BID) | ORAL | 0 refills | Status: DC

## 2018-12-06 MED ORDER — HYDROCODONE-HOMATROPINE 5-1.5 MG/5ML PO SYRP: 5.0000 mL | ORAL_SOLUTION | Freq: Four times a day (QID) | ORAL | 0 refills | Status: DC | PRN

## 2018-12-06 MED ORDER — DOXYCYCLINE HYCLATE 100 MG PO TABS
100.0000 mg | ORAL_TABLET | Freq: Once | ORAL | Status: AC
  Administered 2018-12-06: 100 mg via ORAL
  Filled 2018-12-06: qty 1

## 2018-12-06 MED ORDER — HYDROCOD POLST-CPM POLST ER 10-8 MG/5ML PO SUER
5.0000 mL | Freq: Once | ORAL | Status: AC
  Administered 2018-12-06: 5 mL via ORAL
  Filled 2018-12-06: qty 5

## 2018-12-06 NOTE — Discharge Instructions (Addendum)
Your vital signs are all within normal limits.  Your oxygen level is 100% on room air.  Your chest x-ray shows evidence of bronchitis, but no pneumonia, no collapsed lung, no mass, and no fluid.  Your examination suggest bronchitis and upper respiratory infection.  Please use moist Dimetapp, Claritin-D, or the decongesting medication of your choice.  Please continue to use your albuterol treatments every 4 hours as needed.  Use Decadron and doxycycline 2 times daily with food.  Please see Jacquelin Hawking for additional evaluation and management if not improving.  Return to the emergency department if any changes in your condition, problems, or concerns. Please stop smoking.

## 2018-12-06 NOTE — ED Provider Notes (Signed)
[facility] EMERGENCY DEPARTMENT Provider Note   CSN: [id number] Arrival date & time: 12/06/18  5621    History   Chief Complaint Chief Complaint  Patient presents with  . Cough    HPI [name] is a 38 y.o. female.     Patient reports that she has been having problems with cough and congestion for approximately 4 weeks.  She was seen by the primary care physician 2 weeks ago, and again last week.  She feels as though she is getting progressively worse instead of better and came to the emergency department for evaluation.  There is been no hemoptysis.  No loss of consciousness.  No high fever reported.  The patient has not been traveling recently.  The history is provided by the patient.  Cough  Cough characteristics:  Non-productive Severity:  Moderate Onset quality:  Gradual Duration:  4 weeks Timing:  Constant Progression:  Worsening Chronicity:  New Smoker: yes   Context: weather changes   Context: not sick contacts   Relieved by:  Nothing Worsened by:  Nothing Ineffective treatments: antibiotics, prednisone. Associated symptoms: chills and sinus congestion   Associated symptoms: no chest pain, no eye discharge, no fever, no rash, no shortness of breath, no sore throat and no wheezing   Risk factors: no recent travel     Past Medical History:  Diagnosis Date  . Hypercholesteremia     Patient Active Problem List   Diagnosis Date Noted  . Dysmenorrhea 06/30/2018  . Hyperlipidemia 09/27/2015  . Obesity, unspecified 09/27/2015  . Cigarette nicotine dependence, uncomplicated 09/27/2015  . Cholecystitis with cholelithiasis 07/13/2014    Past Surgical History:  Procedure Laterality Date  . CHOLECYSTECTOMY N/A 07/13/2014   Procedure: LAPAROSCOPIC CHOLECYSTECTOMY;  Surgeon: Marlane Hatcher, MD;  Location: [facility];  Service: General;  Laterality: N/A;  . DENTAL SURGERY  2018   plate top & bottom  . TUBAL LIGATION       OB History    Gravida  2   Para  2   Term      Preterm      AB      Living  2     SAB      TAB      Ectopic      Multiple      Live Births               Home Medications    Prior to Admission medications   Medication Sig Start Date End Date Taking? Authorizing Provider  albuterol (PROVENTIL) (2.5 MG/3ML) 0.083% nebulizer solution Nebulize 1 vial q 4-6 hour prn 11/16/18   Jacquelin Hawking, PA-C  azithromycin (ZITHROMAX) 250 MG tablet Day 1 take 2 tablets po once Days 2-5 take 1 tablet po qd 11/30/18   Jacquelin Hawking, PA-C  benzonatate (TESSALON PERLES) 100 MG capsule 1 - 2 po q 8 hour prn coug 11/30/18   Jacquelin Hawking, PA-C  fenofibrate (TRICOR) 145 MG tablet Take 1 tablet (145 mg total) by mouth daily. 10/28/18   Jacquelin Hawking, PA-C  IRON PO Take 1 tablet by mouth daily.    [provider]  megestrol (MEGACE) 40 MG tablet Take 1 tablet (40 mg total) by mouth 3 (three) times daily. Until bleeding stops, then once daily until followup 10/20/18   Tilda Burrow, MD  meloxicam (MOBIC) 15 MG tablet TAKE 1 TABLET BY MOUTH ONCE DAILY AS NEEDED FOR PAIN 09/13/18   Jacquelin Hawking, PA-C  norethindrone [facility])  0.35 MG tablet Take 1 tablet (0.35 mg total) by mouth daily. 10/20/18   Tilda Burrow, MD  predniSONE (DELTASONE) 20 MG tablet Take 1 tablet (20 mg total) by mouth 2 (two) times daily with a meal. 11/30/18   Jacquelin Hawking, PA-C  simvastatin (ZOCOR) 20 MG tablet Take 1 tablet (20 mg total) by mouth daily. 05/10/18   Jacquelin Hawking, PA-C    Family History Family History  Problem Relation Age of Onset  . Heart disease Mother   . Diabetes Mother   . Heart disease Father   . Hypertension Father   . Thyroid disease Father   . Hyperlipidemia Father     Social History Social History   Tobacco Use  . Smoking status: Current Every Day Smoker    Packs/day: 0.50    Years: 15.00    Pack years: 7.50    Types: Cigarettes  . Smokeless tobacco: Never Used  Substance  Use Topics  . Alcohol use: No  . Drug use: No     Allergies   Patient has no known allergies.   Review of Systems Review of Systems  Constitutional: Positive for chills. Negative for activity change and fever.       All ROS Neg except as noted in HPI  HENT: Positive for congestion. Negative for nosebleeds and sore throat.   Eyes: Negative for photophobia and discharge.  Respiratory: Positive for cough. Negative for shortness of breath and wheezing.   Cardiovascular: Negative for chest pain and palpitations.  Gastrointestinal: Negative for abdominal pain and blood in stool.  Genitourinary: Negative for dysuria, frequency and hematuria.  Musculoskeletal: Negative for arthralgias, back pain and neck pain.  Skin: Negative.  Negative for rash.  Neurological: Negative for dizziness, seizures and speech difficulty.  Psychiatric/Behavioral: Negative for confusion and hallucinations.     Physical Exam Updated Vital Signs BP 138/86 (BP Location: Right Arm)   Pulse 76   Temp 98.4 F (36.9 C) (Oral)   Resp 18   Ht 5\' 4"  (1.626 m)   Wt 99.8 kg   LMP  (LMP Unknown)   SpO2 100%   BMI 37.76 kg/m   Physical Exam Vitals signs and nursing note reviewed.  Constitutional:      Appearance: She is well-developed. She is not toxic-appearing.  HENT:     Head: Normocephalic.     Right Ear: Tympanic membrane and external ear normal.     Left Ear: Tympanic membrane and external ear normal.     Nose: Congestion present.  Eyes:     General: Lids are normal.     Pupils: Pupils are equal, round, and reactive to light.  Neck:     Musculoskeletal: Normal range of motion and neck supple.     Vascular: No carotid bruit.  Cardiovascular:     Rate and Rhythm: Normal rate and regular rhythm.     Pulses: Normal pulses.     Heart sounds: Normal heart sounds.  Pulmonary:     Effort: No respiratory distress.     Breath sounds: Rhonchi present.     Comments: Course breath sound noted. Symmetrical  rise and fall of the chest. Abdominal:     General: Bowel sounds are normal.     Palpations: Abdomen is soft.     Tenderness: There is no abdominal tenderness. There is no guarding.  Musculoskeletal: Normal range of motion.     Right lower leg: No edema.     Left lower leg: No edema.  Comments: Cap refill less than 2 sec.  Lymphadenopathy:     Head:     Right side of head: No submandibular adenopathy.     Left side of head: No submandibular adenopathy.     Cervical: No cervical adenopathy.  Skin:    General: Skin is warm and dry.  Neurological:     Mental Status: She is alert and oriented to person, place, and time.     Cranial Nerves: No cranial nerve deficit.     Sensory: No sensory deficit.  Psychiatric:        Speech: Speech normal.      ED Treatments / Results  Labs (all labs ordered are listed, but only abnormal results are displayed) Labs Reviewed - No data to display  EKG None  Radiology Dg Chest 2 View  Result Date: 12/06/2018 CLINICAL DATA:  Cough for several weeks EXAM: CHEST - 2 VIEW COMPARISON:  None. FINDINGS: Lungs are clear. The heart size and pulmonary vascularity are normal. No adenopathy. No pneumothorax. No bone lesions. IMPRESSION: No edema or consolidation. Electronically Signed   By: Bretta Bang III M.D.   On: 12/06/2018 09:49    Procedures Procedures (including critical care time)  Medications Ordered in ED Medications - No data to display   Initial Impression / Assessment and Plan / ED Course  I have reviewed the triage vital signs and the nursing notes.  Pertinent labs & imaging results that were available during my care of the patient were reviewed by me and considered in my medical decision making (see chart for details).          Final Clinical Impressions(s) / ED Diagnoses MDM Vital signs reviewed.  Pulse oximetry is 100% on room air.  Within normal limits by my interpretation.  The chest x-ray is consistent with some  bronchitis, but no other changes noted.  The examination suggest bronchitis and upper respiratory infection.  The patient will be treated with Decadron, Hycodan, and the decongestant medication of her choice.  Patient has albuterol nebulizer at home.  I have strongly encouraged the patient to stop smoking.  I discussed with the patient that she may have had more than 1 medical issue going on during this 3 to 4-week period, thus extending the time that the patient has been sick.  The patient will follow-up with the primary physician.  The patient will return to the emergency department for additional evaluation if not improving, or any emergent situations occur.  I strongly encouraged the patient to stop smoking.     Final diagnoses:  Acute bronchitis, unspecified organism    ED Discharge Orders         Ordered    HYDROcodone-homatropine (HYCODAN) 5-1.5 MG/5ML syrup  Every 6 hours PRN     12/06/18 1110    dexamethasone (DECADRON) 4 MG tablet  2 times daily with meals     12/06/18 1110    doxycycline (VIBRAMYCIN) 100 MG capsule  2 times daily     12/06/18 1110           Ivery Quale, PA-C 12/06/18 1132    Vanetta Mulders, MD 12/06/18 [phone]

## 2018-12-06 NOTE — ED Notes (Signed)
Patient transported to X-ray 

## 2018-12-06 NOTE — ED Triage Notes (Signed)
Patient complaining of cough x 4 weeks. States she saw PCP last week and was placed on antibiotics and prednisone with no relief. Denies fevers.

## 2018-12-07 ENCOUNTER — Other Ambulatory Visit: Payer: Self-pay | Admitting: Physician Assistant

## 2018-12-07 MED ORDER — SIMVASTATIN 20 MG PO TABS: 20.0000 mg | ORAL_TABLET | Freq: Every day | ORAL | 6 refills | Status: DC

## 2018-12-15 ENCOUNTER — Telehealth: Payer: Self-pay | Admitting: Physician Assistant

## 2018-12-15 NOTE — Telephone Encounter (Signed)
Patient called in stating she was having swelling due to prednisone and she was only feeling about 20 percent better from being sick. After speaking with Carollee Herter patient was told it was normal to have swelling after two rounds of prednisone and that she was having trouble breathing to come in. Patient stated she would wait and call back at the end of the week if she didn't feel better.

## 2019-01-25 ENCOUNTER — Ambulatory Visit: Payer: Self-pay | Admitting: Physician Assistant

## 2019-02-15 ENCOUNTER — Encounter: Payer: Self-pay | Admitting: Physician Assistant

## 2019-02-15 ENCOUNTER — Ambulatory Visit: Payer: Self-pay | Admitting: Physician Assistant

## 2019-02-15 DIAGNOSIS — F419 Anxiety disorder, unspecified: Secondary | ICD-10-CM

## 2019-02-15 DIAGNOSIS — E785 Hyperlipidemia, unspecified: Secondary | ICD-10-CM

## 2019-02-15 DIAGNOSIS — F17200 Nicotine dependence, unspecified, uncomplicated: Secondary | ICD-10-CM

## 2019-02-15 MED ORDER — CITALOPRAM HYDROBROMIDE 20 MG PO TABS: 20.0000 mg | ORAL_TABLET | Freq: Every day | ORAL | 0 refills | Status: DC

## 2019-02-15 NOTE — Progress Notes (Signed)
There were no vitals taken for this visit.   Subjective:    Patient ID: [name], female    DOB: 09/03/1981, 38 y.o.   MRN: [id number]  HPI: Jocelyn LargeMelissa A [name] is a 38 y.o. female presenting on 02/15/2019 for No chief complaint on file.   HPI   This is a telemediciine appointment through [employer] due to coronavirus pandemic  I connected with  [name] on 02/15/19 by a video enabled telemedicine application and verified that I am speaking with the correct person using two identifiers.   I discussed the limitations of evaluation and management by telemedicine. The patient expressed understanding and agreed to proceed.  Pt is having a lot of anxiety related to CV19.  She is laying awake at night and awakening early in the morming.  She reports watching the news all day whether it is on TV her computer or her telephone.  She says she is exercising regularly (she has 2 children and 2 dogs in an apartment).  She says she is not drinking but she is smoking more.  She denies depression- denies SI, HI.  Says she is just very anxious.   She stays home mostly but does wear a mask when she goes into grocery store.  She says she is feeling well physically.   Relevant past medical, surgical, family and social history reviewed and updated as indicated. Interim medical history since our last visit reviewed. Allergies and medications reviewed and updated.   Current Outpatient Medications:  .  albuterol (PROVENTIL) (2.5 MG/3ML) 0.083% nebulizer solution, Nebulize 1 vial q 4-6 hour prn, Disp: 25 vial, Rfl: 3 .  fenofibrate (TRICOR) 145 MG tablet, Take 1 tablet (145 mg total) by mouth daily., Disp: 30 tablet, Rfl: 4 .  IRON PO, Take 1 tablet by mouth daily., Disp: , Rfl:  .  megestrol (MEGACE) 40 MG tablet, Take 1 tablet (40 mg total) by mouth 3 (three) times daily. Until bleeding stops, then once daily until followup, Disp: 45 tablet, Rfl: 2 .  meloxicam (MOBIC) 15 MG tablet, TAKE 1 TABLET BY MOUTH  ONCE DAILY AS NEEDED FOR PAIN, Disp: 30 tablet, Rfl: 0 .  norethindrone (MICRONOR,CAMILA,ERRIN) 0.35 MG tablet, Take 1 tablet (0.35 mg total) by mouth daily., Disp: 1 Package, Rfl: 11 .  simvastatin (ZOCOR) 20 MG tablet, Take 1 tablet (20 mg total) by mouth daily., Disp: 30 tablet, Rfl: 6    Review of Systems  Per HPI unless specifically indicated above     Objective:    There were no vitals taken for this visit.  Wt Readings from Last 3 Encounters:  12/06/18 220 lb (99.8 kg)  11/30/18 222 lb 12 oz (101 kg)  11/16/18 219 lb 4 oz (99.5 kg)    Physical Exam Constitutional:      General: She is not in acute distress.    Appearance: She is obese. She is not ill-appearing.  HENT:     Head: Normocephalic and atraumatic.  Pulmonary:     Effort: Pulmonary effort is normal. No respiratory distress.  Neurological:     Mental Status: She is alert and oriented to person, place, and time.  Psychiatric:        Attention and Perception: Attention normal.        Mood and Affect: Mood is anxious.        Speech: Speech normal.        Behavior: Behavior normal. Behavior is cooperative.  Thought Content: Thought content normal.         Assessment & Plan:     Encounter Diagnoses  Name Primary?  Marland Kitchen Anxiety Yes  . Hyperlipidemia, unspecified hyperlipidemia type   . Tobacco use disorder     -will Defer labs at this time -pt Continue current rx for lipids -Pt counseled to limit the news to no more than 30 minutes/day.  Will rx citalopram.  Pt is given phone number for [facility] to get counseling.   -encouraged pt to get plenty of exercise -pt to follow up 3 weeks.  She is to contact office sooner prn

## 2019-03-09 ENCOUNTER — Encounter: Payer: Self-pay | Admitting: Physician Assistant

## 2019-03-09 ENCOUNTER — Ambulatory Visit: Payer: Self-pay | Admitting: Physician Assistant

## 2019-03-09 DIAGNOSIS — F419 Anxiety disorder, unspecified: Secondary | ICD-10-CM

## 2019-03-09 MED ORDER — CITALOPRAM HYDROBROMIDE 20 MG PO TABS: 20.0000 mg | ORAL_TABLET | Freq: Every day | ORAL | 0 refills | Status: DC

## 2019-03-09 NOTE — Progress Notes (Signed)
There were no vitals taken for this visit.   Subjective:    Patient ID: [name], female    DOB: June 08, 1981, 38 y.o.   MRN: [id number]  HPI: [name] is a 38 y.o. female presenting on 03/09/2019 for No chief complaint on file.   HPI    This is a telemedicine visit through [employer] due to coronavirus pandemic  I connected with  [name] on 03/09/19 by a video enabled telemedicine application and verified that I am speaking with the correct person using two identifiers.   I discussed the limitations of evaluation and management by telemedicine. The patient expressed understanding and agreed to proceed.  Pt is at home.  Provider is at office/clinic  This appointemnt is to follow up on pt's mood.  She was started on citalopram at appointment on 02/15/19.    She says she doesn't really know if it has done anything or not.  She has improved her activities some and is limiting her watching of the news.  She is trying to spend more time with her pets and kids and getting outside.     She says she is still having anxiety.  She is sleeping sometimes but still not her normal sleep.  She denies SI, HI.   Relevant past medical, surgical, family and social history reviewed and updated as indicated. Interim medical history since our last visit reviewed. Allergies and medications reviewed and updated.    Current Outpatient Medications:  .  albuterol (PROVENTIL) (2.5 MG/3ML) 0.083% nebulizer solution, Nebulize 1 vial q 4-6 hour prn, Disp: 25 vial, Rfl: 3 .  citalopram (CELEXA) 20 MG tablet, Take 1 tablet (20 mg total) by mouth daily., Disp: 30 tablet, Rfl: 0 .  fenofibrate (TRICOR) 145 MG tablet, Take 1 tablet (145 mg total) by mouth daily., Disp: 30 tablet, Rfl: 4 .  IRON PO, Take 1 tablet by mouth daily., Disp: , Rfl:  .  megestrol (MEGACE) 40 MG tablet, Take 1 tablet (40 mg total) by mouth 3 (three) times daily. Until bleeding stops, then once daily until followup, Disp: 45 tablet,  Rfl: 2 .  meloxicam (MOBIC) 15 MG tablet, TAKE 1 TABLET BY MOUTH ONCE DAILY AS NEEDED FOR PAIN, Disp: 30 tablet, Rfl: 0 .  norethindrone (MICRONOR,CAMILA,ERRIN) 0.35 MG tablet, Take 1 tablet (0.35 mg total) by mouth daily., Disp: 1 Package, Rfl: 11 .  simvastatin (ZOCOR) 20 MG tablet, Take 1 tablet (20 mg total) by mouth daily., Disp: 30 tablet, Rfl: 6   Review of Systems  Per HPI unless specifically indicated above     Objective:    There were no vitals taken for this visit.  Wt Readings from Last 3 Encounters:  12/06/18 220 lb (99.8 kg)  11/30/18 222 lb 12 oz (101 kg)  11/16/18 219 lb 4 oz (99.5 kg)    Physical Exam Constitutional:      General: She is not in acute distress.    Appearance: She is obese. She is not ill-appearing.  HENT:     Head: Normocephalic and atraumatic.  Pulmonary:     Effort: Pulmonary effort is normal. No respiratory distress.  Neurological:     Mental Status: She is alert and oriented to person, place, and time.  Psychiatric:        Attention and Perception: Attention normal.        Mood and Affect: Affect normal.        Speech: Speech normal.  Behavior: Behavior normal. Behavior is cooperative.        Thought Content: Thought content normal.        Cognition and Memory: Cognition normal.         Assessment & Plan:   Encounter Diagnosis  Name Primary?  Marland Kitchen. Anxiety Yes       -will continue citalopram and recheck in 2 - 3 weeks.  Pt to contact office sooner prn

## 2019-03-10 ENCOUNTER — Ambulatory Visit: Payer: Self-pay | Admitting: Physician Assistant

## 2019-03-28 ENCOUNTER — Other Ambulatory Visit: Payer: Self-pay | Admitting: Physician Assistant

## 2019-03-29 ENCOUNTER — Ambulatory Visit: Payer: Self-pay | Admitting: Physician Assistant

## 2019-03-29 ENCOUNTER — Encounter: Payer: Self-pay | Admitting: Physician Assistant

## 2019-03-29 DIAGNOSIS — E785 Hyperlipidemia, unspecified: Secondary | ICD-10-CM

## 2019-03-29 DIAGNOSIS — F419 Anxiety disorder, unspecified: Secondary | ICD-10-CM | POA: Insufficient documentation

## 2019-03-29 DIAGNOSIS — L259 Unspecified contact dermatitis, unspecified cause: Secondary | ICD-10-CM | POA: Insufficient documentation

## 2019-03-29 DIAGNOSIS — L239 Allergic contact dermatitis, unspecified cause: Secondary | ICD-10-CM

## 2019-03-29 MED ORDER — PREDNISONE 10 MG PO TABS: ORAL_TABLET | ORAL | 0 refills | Status: DC

## 2019-03-29 MED ORDER — CITALOPRAM HYDROBROMIDE 40 MG PO TABS: 40.0000 mg | ORAL_TABLET | Freq: Every day | ORAL | 3 refills | Status: DC

## 2019-03-29 NOTE — Progress Notes (Signed)
There were no vitals taken for this visit.   Subjective:    Patient ID: [name], female    DOB: 09/02/1981, 38 y.o.   MRN: [id number]  HPI: [name] is a 38 y.o. female presenting on 03/29/2019 for No chief complaint on file.   HPI  This is a telemedicine visit through [employer] due to coronavirus pandemic  I connected with  Isaura A Wurzer on 03/29/19 by a video enabled telemedicine application and verified that I am speaking with the correct person using two identifiers.   I discussed the limitations of evaluation and management by telemedicine. The patient expressed understanding and agreed to proceed.   Pt is at home. Provider is at office  Pt says she has Poison ivy- started about a week ago and it has spread-  She has done fine on prednisone in the past  She still doesn't feel like the citalopram has helped any.  She is still having a lot of anxiety.  She denies depression.  She is limiting her time watching the news.      Relevant past medical, surgical, family and social history reviewed and updated as indicated. Interim medical history since our last visit reviewed. Allergies and medications reviewed and updated.   Current Outpatient Medications:  .  citalopram (CELEXA) 20 MG tablet, Take 1 tablet (20 mg total) by mouth daily., Disp: 30 tablet, Rfl: 0 .  fenofibrate (TRICOR) 145 MG tablet, Take 1 tablet by mouth once daily, Disp: 30 tablet, Rfl: 6 .  IRON PO, Take 1 tablet by mouth daily., Disp: , Rfl:  .  megestrol (MEGACE) 40 MG tablet, Take 1 tablet (40 mg total) by mouth 3 (three) times daily. Until bleeding stops, then once daily until followup, Disp: 45 tablet, Rfl: 2 .  meloxicam (MOBIC) 15 MG tablet, TAKE 1 TABLET BY MOUTH ONCE DAILY AS NEEDED FOR PAIN, Disp: 30 tablet, Rfl: 0 .  norethindrone (MICRONOR,CAMILA,ERRIN) 0.35 MG tablet, Take 1 tablet (0.35 mg total) by mouth daily., Disp: 1 Package, Rfl: 11 .  simvastatin (ZOCOR) 20 MG tablet, Take 1 tablet  (20 mg total) by mouth daily., Disp: 30 tablet, Rfl: 6 .  albuterol (PROVENTIL) (2.5 MG/3ML) 0.083% nebulizer solution, Nebulize 1 vial q 4-6 hour prn (Patient not taking: Reported on 03/29/2019), Disp: 25 vial, Rfl: 3     Review of Systems  Per HPI unless specifically indicated above     Objective:    There were no vitals taken for this visit.  Wt Readings from Last 3 Encounters:  12/06/18 220 lb (99.8 kg)  11/30/18 222 lb 12 oz (101 kg)  11/16/18 219 lb 4 oz (99.5 kg)    Physical Exam Constitutional:      General: She is not in acute distress.    Appearance: She is obese. She is not ill-appearing.  HENT:     Head: Normocephalic and atraumatic.  Pulmonary:     Effort: Pulmonary effort is normal.  Skin:    Findings: Rash present.     Comments: Vesicular rash on all 4 extremities  Neurological:     Mental Status: She is alert and oriented to person, place, and time.  Psychiatric:        Attention and Perception: Attention normal.        Speech: Speech normal.        Behavior: Behavior is cooperative.        Thought Content: Thought content normal.  Cognition and Memory: Cognition normal.          Assessment & Plan:   Encounter Diagnoses  Name Primary?  Marland Kitchen Anxiety   . Allergic contact dermatitis, unspecified trigger   . Hyperlipidemia, unspecified hyperlipidemia type Yes      -rx prednisone for poison ivy.  Reviewed with pt about taking it with food, taking it in the morning. -discussed with pt Increasing the citalopram versus trying a different agent and she would like to increase this and see if it helps -will Get labs for lipids -follow up 3 week.  Pt to contact office sooner prn

## 2019-04-07 ENCOUNTER — Encounter: Payer: Self-pay | Admitting: Physician Assistant

## 2019-04-07 ENCOUNTER — Ambulatory Visit: Payer: Self-pay | Admitting: Physician Assistant

## 2019-04-07 VITALS — BP 116/70 | HR 68 | Temp 98.2°F | Wt 218.4 lb

## 2019-04-07 DIAGNOSIS — F419 Anxiety disorder, unspecified: Secondary | ICD-10-CM

## 2019-04-07 DIAGNOSIS — S30861A Insect bite (nonvenomous) of abdominal wall, initial encounter: Secondary | ICD-10-CM

## 2019-04-07 DIAGNOSIS — W57XXXA Bitten or stung by nonvenomous insect and other nonvenomous arthropods, initial encounter: Secondary | ICD-10-CM

## 2019-04-07 MED ORDER — DOXYCYCLINE HYCLATE 100 MG PO TABS: 100.0000 mg | ORAL_TABLET | Freq: Two times a day (BID) | ORAL | 0 refills | Status: DC

## 2019-04-07 NOTE — Progress Notes (Signed)
BP 116/70   Pulse 68   Temp 98.2 F (36.8 C)   SpO2 98%    Subjective:    Patient ID: [name], female    DOB: 06/09/1981, 38 y.o.   MRN: [id number]  HPI: [name] is a 38 y.o. female presenting on 04/07/2019 for Rash (pt thinks she was bit by a spider on Saturday, pt states it started as a little circle then started reddening around. pt states it is itchy and hasused benegryl rub cream and taken benadryl tab but does not help)   HPI  Pt had negative screeningquestionnairefor CV19  Chief Complaint  Patient presents with  . Rash    pt states she was bit by a bug on Saturday pt thinks maybe a spider, pt states it started as a little circle then started reddening around. pt states it is itchy and has used benegryl rub cream and taken benadryl tab but does not help      Pt sasys citalopram is working a lot better since dosage increase   Relevant past medical, surgical, family and social history reviewed and updated as indicated. Interim medical history since our last visit reviewed. Allergies and medications reviewed and updated.   Current Outpatient Medications:  .  albuterol (PROVENTIL) (2.5 MG/3ML) 0.083% nebulizer solution, Nebulize 1 vial q 4-6 hour prn, Disp: 25 vial, Rfl: 3 .  citalopram (CELEXA) 40 MG tablet, Take 1 tablet (40 mg total) by mouth daily., Disp: 30 tablet, Rfl: 3 .  fenofibrate (TRICOR) 145 MG tablet, Take 1 tablet by mouth once daily, Disp: 30 tablet, Rfl: 6 .  IRON PO, Take 1 tablet by mouth daily., Disp: , Rfl:  .  megestrol (MEGACE) 40 MG tablet, Take 1 tablet (40 mg total) by mouth 3 (three) times daily. Until bleeding stops, then once daily until followup, Disp: 45 tablet, Rfl: 2 .  meloxicam (MOBIC) 15 MG tablet, TAKE 1 TABLET BY MOUTH ONCE DAILY AS NEEDED FOR PAIN, Disp: 30 tablet, Rfl: 0 .  norethindrone (MICRONOR,CAMILA,ERRIN) 0.35 MG tablet, Take 1 tablet (0.35 mg total) by mouth daily., Disp: 1 Package, Rfl: 11 .  simvastatin  (ZOCOR) 20 MG tablet, Take 1 tablet (20 mg total) by mouth daily., Disp: 30 tablet, Rfl: 6 .  predniSONE (DELTASONE) 10 MG tablet, Day 1 take 6 tablets po qam. Day 2 take 5 tablets po qam. Day 3 take 4 tablets po qam. Day 4 take 3 tablets po qam. Day 5 take 2 tablets po qam. Day 6 take 1 tablet po qam. (Patient not taking: Reported on 04/07/2019), Disp: 21 tablet, Rfl: 0   Review of Systems  Per HPI unless specifically indicated above     Objective:    BP 116/70   Pulse 68   Temp 98.2 F (36.8 C)   SpO2 98%   Wt Readings from Last 3 Encounters:  12/06/18 220 lb (99.8 kg)  11/30/18 222 lb 12 oz (101 kg)  11/16/18 219 lb 4 oz (99.5 kg)    Physical Exam Vitals signs reviewed.  Constitutional:      General: She is not in acute distress.    Appearance: Normal appearance. She is obese. She is not ill-appearing.  HENT:     Head: Normocephalic and atraumatic.  Cardiovascular:     Rate and Rhythm: Normal rate and regular rhythm.     Heart sounds: Normal heart sounds.  Pulmonary:     Effort: Pulmonary effort is normal. No respiratory distress.  Abdominal:  General: Bowel sounds are normal. There is no distension.     Palpations: Abdomen is soft. There is no mass.     Tenderness: There is no abdominal tenderness.  Musculoskeletal:     Right lower leg: No edema.     Left lower leg: No edema.  Skin:    Comments: Approximately 1 inch diameter area inferior to the R breast that is erythematous with central wound consistent with insect bite.  There is no purulence or discharge.   There is no swelling of the area.  Non-tender.   Neurological:     Mental Status: She is alert and oriented to person, place, and time.           Assessment & Plan:   Encounter Diagnoses  Name Primary?  . Insect bite of abdominal wall, initial encounter Yes  . Anxiety       -rx doxycycline -pt to follow up as scheduled.  She is to contact office sooner prn worsening or new symptoms

## 2019-04-15 ENCOUNTER — Other Ambulatory Visit (HOSPITAL_COMMUNITY)
Admission: RE | Admit: 2019-04-15 | Discharge: 2019-04-15 | Disposition: A | Discharge status: Home / Self Care | Payer: Self-pay | Source: Other Acute Inpatient Hospital | Attending: Physician Assistant | Admitting: Physician Assistant

## 2019-04-15 DIAGNOSIS — E785 Hyperlipidemia, unspecified: Secondary | ICD-10-CM

## 2019-04-15 LAB — LIPID PANEL
Cholesterol: 178 mg/dL (ref 0–200)
HDL: 26 mg/dL — ABNORMAL LOW (ref >40)
LDL Cholesterol: 117 mg/dL — ABNORMAL HIGH (ref 0–99)
Total CHOL/HDL Ratio: 6.8 RATIO
Triglycerides: 176 mg/dL — ABNORMAL HIGH (ref <150)
VLDL: 35 mg/dL (ref 0–40)

## 2019-04-15 LAB — COMPREHENSIVE METABOLIC PANEL
ALT: 34 U/L (ref 0–44)
AST: 27 U/L (ref 15–41)
Albumin: 4.1 g/dL (ref 3.5–5.0)
Alkaline Phosphatase: 31 U/L — ABNORMAL LOW (ref 38–126)
Anion gap: 10 (ref 5–15)
BUN: 12 mg/dL (ref 6–20)
CO2: 21 mmol/L — ABNORMAL LOW (ref 22–32)
Calcium: 9.5 mg/dL (ref 8.9–10.3)
Chloride: 109 mmol/L (ref 98–111)
Creatinine, Ser: 0.94 mg/dL (ref 0.44–1.00)
GFR calc Af Amer: >60 mL/min (ref >60)
GFR calc non Af Amer: >60 mL/min (ref >60)
Glucose, Bld: 91 mg/dL (ref 70–99)
Potassium: 4.1 mmol/L (ref 3.5–5.1)
Sodium: 140 mmol/L (ref 135–145)
Total Bilirubin: 0.5 mg/dL (ref 0.3–1.2)
Total Protein: 6.9 g/dL (ref 6.5–8.1)

## 2019-04-18 ENCOUNTER — Ambulatory Visit: Payer: Self-pay | Admitting: Physician Assistant

## 2019-04-18 ENCOUNTER — Encounter: Payer: Self-pay | Admitting: Physician Assistant

## 2019-04-18 DIAGNOSIS — E669 Obesity, unspecified: Secondary | ICD-10-CM

## 2019-04-18 DIAGNOSIS — F419 Anxiety disorder, unspecified: Secondary | ICD-10-CM

## 2019-04-18 DIAGNOSIS — Z862 Personal history of diseases of the blood and blood-forming organs and certain disorders involving the immune mechanism: Secondary | ICD-10-CM

## 2019-04-18 DIAGNOSIS — F17200 Nicotine dependence, unspecified, uncomplicated: Secondary | ICD-10-CM

## 2019-04-18 DIAGNOSIS — E785 Hyperlipidemia, unspecified: Secondary | ICD-10-CM

## 2019-04-18 NOTE — Progress Notes (Signed)
There were no vitals taken for this visit.   Subjective:    Patient ID: [name], female    DOB: 06/17/1981, 38 y.o.   MRN: [id number]  HPI: [name] is a 38 y.o. female presenting on 04/18/2019 for No chief complaint on file.   HPI   This is a telemedicine appointment through [employer] due to coronavirus pandemic  I connected with  [name] on 04/18/19 by a video enabled telemedicine application and verified that I am speaking with the correct person using two identifiers.   I discussed the limitations of evaluation and management by telemedicine. The patient expressed understanding and agreed to proceed.   Pt is in her parked car.  She just dropped her husband off at work.  Provider is at office.   Pt states mood is " a whole lot better".  She says she is doing very well on the increased citalopram.  She is sleeping good and is not overwhelmed with anxiety.    Pt says she has no complaints today.  She is still smoking  She says she wears a mask when in public  She has no complaints today   Relevant past medical, surgical, family and social history reviewed and updated as indicated. Interim medical history since our last visit reviewed. Allergies and medications reviewed and updated.   Current Outpatient Medications:  .  citalopram (CELEXA) 40 MG tablet, Take 1 tablet (40 mg total) by mouth daily., Disp: 30 tablet, Rfl: 3 .  fenofibrate (TRICOR) 145 MG tablet, Take 1 tablet by mouth once daily, Disp: 30 tablet, Rfl: 6 .  IRON PO, Take 1 tablet by mouth daily., Disp: , Rfl:  .  megestrol (MEGACE) 40 MG tablet, Take 1 tablet (40 mg total) by mouth 3 (three) times daily. Until bleeding stops, then once daily until followup, Disp: 45 tablet, Rfl: 2 .  meloxicam (MOBIC) 15 MG tablet, TAKE 1 TABLET BY MOUTH ONCE DAILY AS NEEDED FOR PAIN, Disp: 30 tablet, Rfl: 0 .  norethindrone (MICRONOR,CAMILA,ERRIN) 0.35 MG tablet, Take 1 tablet (0.35 mg total) by mouth daily.,  Disp: 1 Package, Rfl: 11 .  simvastatin (ZOCOR) 20 MG tablet, Take 1 tablet (20 mg total) by mouth daily., Disp: 30 tablet, Rfl: 6 .  albuterol (PROVENTIL) (2.5 MG/3ML) 0.083% nebulizer solution, Nebulize 1 vial q 4-6 hour prn (Patient not taking: Reported on 04/18/2019), Disp: 25 vial, Rfl: 3    Review of Systems  Per HPI unless specifically indicated above     Objective:    There were no vitals taken for this visit.  Wt Readings from Last 3 Encounters:  04/07/19 218 lb 6.4 oz (99.1 kg)  12/06/18 220 lb (99.8 kg)  11/30/18 222 lb 12 oz (101 kg)    Physical Exam Constitutional:      General: She is not in acute distress.    Appearance: She is obese. She is not ill-appearing.  HENT:     Head: Normocephalic and atraumatic.  Pulmonary:     Effort: Pulmonary effort is normal. No respiratory distress.  Neurological:     Mental Status: She is alert and oriented to person, place, and time.  Psychiatric:        Attention and Perception: Attention normal.        Mood and Affect: Mood normal.        Speech: Speech normal.        Behavior: Behavior is cooperative.  Cognition and Memory: Cognition normal.     Results for orders placed or performed during the hospital encounter of 04/15/19  Lipid panel  Result Value Ref Range   Cholesterol 178 0 - 200 mg/dL   Triglycerides 176 (H) <150 mg/dL   HDL 26 (L) >40 mg/dL   Total CHOL/HDL Ratio 6.8 RATIO   VLDL 35 0 - 40 mg/dL   LDL Cholesterol 117 (H) 0 - 99 mg/dL  Comprehensive metabolic panel  Result Value Ref Range   Sodium 140 135 - 145 mmol/L   Potassium 4.1 3.5 - 5.1 mmol/L   Chloride 109 98 - 111 mmol/L   CO2 21 (L) 22 - 32 mmol/L   Glucose, Bld 91 70 - 99 mg/dL   BUN 12 6 - 20 mg/dL   Creatinine, Ser 0.94 0.44 - 1.00 mg/dL   Calcium 9.5 8.9 - 10.3 mg/dL   Total Protein 6.9 6.5 - 8.1 g/dL   Albumin 4.1 3.5 - 5.0 g/dL   AST 27 15 - 41 U/L   ALT 34 0 - 44 U/L   Alkaline Phosphatase 31 (L) 38 - 126 U/L   Total  Bilirubin 0.5 0.3 - 1.2 mg/dL   GFR calc non Af Amer >60 >60 mL/min   GFR calc Af Amer >60 >60 mL/min   Anion gap 10 5 - 15      Assessment & Plan:    Encounter Diagnoses  Name Primary?  . Hyperlipidemia, unspecified hyperlipidemia type Yes  . Anxiety   . Tobacco use disorder   . Obesity, unspecified classification, unspecified obesity type, unspecified whether serious comorbidity present   . History of anemia     -Reviewed labs with pt -pt to continue current meds -encouraged smoking cessation -pt encouraged to continue to wear mask when in public -pt to follow up 3 months.  She is to contact office sooner prn

## 2019-04-26 ENCOUNTER — Other Ambulatory Visit: Payer: Self-pay | Admitting: Physician Assistant

## 2019-04-26 ENCOUNTER — Other Ambulatory Visit: Payer: Self-pay | Admitting: Obstetrics and Gynecology

## 2019-04-27 NOTE — Telephone Encounter (Signed)
Telephone call to pt to determine how she's taking the Megace and Micronor. Unable to reach patient. I will refil megace x 30 d and have her make Televisit appt to discuss menses management.

## 2019-06-09 ENCOUNTER — Other Ambulatory Visit: Payer: Self-pay | Admitting: Obstetrics and Gynecology

## 2019-07-11 ENCOUNTER — Ambulatory Visit: Payer: Self-pay | Admitting: Physician Assistant

## 2019-07-12 ENCOUNTER — Other Ambulatory Visit: Payer: Self-pay | Admitting: Physician Assistant

## 2019-07-12 MED ORDER — SIMVASTATIN 20 MG PO TABS: 20.0000 mg | ORAL_TABLET | Freq: Every day | ORAL | 6 refills | Status: DC

## 2019-07-22 ENCOUNTER — Other Ambulatory Visit (HOSPITAL_COMMUNITY)
Admission: RE | Admit: 2019-07-22 | Discharge: 2019-07-22 | Disposition: A | Discharge status: Home / Self Care | Payer: Self-pay | Source: Ambulatory Visit | Attending: Physician Assistant | Admitting: Physician Assistant

## 2019-07-22 DIAGNOSIS — E785 Hyperlipidemia, unspecified: Secondary | ICD-10-CM | POA: Insufficient documentation

## 2019-07-22 DIAGNOSIS — Z862 Personal history of diseases of the blood and blood-forming organs and certain disorders involving the immune mechanism: Secondary | ICD-10-CM | POA: Insufficient documentation

## 2019-07-22 LAB — COMPREHENSIVE METABOLIC PANEL
ALT: 41 U/L (ref 0–44)
AST: 30 U/L (ref 15–41)
Albumin: 4.4 g/dL (ref 3.5–5.0)
Alkaline Phosphatase: 34 U/L — ABNORMAL LOW (ref 38–126)
Anion gap: 8 (ref 5–15)
BUN: 14 mg/dL (ref 6–20)
CO2: 19 mmol/L — ABNORMAL LOW (ref 22–32)
Calcium: 9.7 mg/dL (ref 8.9–10.3)
Chloride: 112 mmol/L — ABNORMAL HIGH (ref 98–111)
Creatinine, Ser: 0.96 mg/dL (ref 0.44–1.00)
GFR calc Af Amer: >60 mL/min (ref >60)
GFR calc non Af Amer: >60 mL/min (ref >60)
Glucose, Bld: 93 mg/dL (ref 70–99)
Potassium: 4.1 mmol/L (ref 3.5–5.1)
Sodium: 139 mmol/L (ref 135–145)
Total Bilirubin: 0.3 mg/dL (ref 0.3–1.2)
Total Protein: 7.7 g/dL (ref 6.5–8.1)

## 2019-07-22 LAB — LIPID PANEL
Cholesterol: 197 mg/dL (ref 0–200)
HDL: 25 mg/dL — ABNORMAL LOW (ref >40)
LDL Cholesterol: 139 mg/dL — ABNORMAL HIGH (ref 0–99)
Total CHOL/HDL Ratio: 7.9 RATIO
Triglycerides: 163 mg/dL — ABNORMAL HIGH (ref <150)
VLDL: 33 mg/dL (ref 0–40)

## 2019-07-22 LAB — HEMOGLOBIN AND HEMATOCRIT, BLOOD
HCT: 40.5 % (ref 36.0–46.0)
Hemoglobin: 13.5 g/dL (ref 12.0–15.0)

## 2019-07-24 ENCOUNTER — Other Ambulatory Visit: Payer: Self-pay | Admitting: Physician Assistant

## 2019-07-24 ENCOUNTER — Other Ambulatory Visit: Payer: Self-pay | Admitting: Obstetrics and Gynecology

## 2019-07-26 ENCOUNTER — Ambulatory Visit: Payer: Self-pay | Admitting: Physician Assistant

## 2019-07-26 ENCOUNTER — Encounter: Payer: Self-pay | Admitting: Physician Assistant

## 2019-07-26 DIAGNOSIS — E785 Hyperlipidemia, unspecified: Secondary | ICD-10-CM

## 2019-07-26 DIAGNOSIS — F17200 Nicotine dependence, unspecified, uncomplicated: Secondary | ICD-10-CM

## 2019-07-26 DIAGNOSIS — F419 Anxiety disorder, unspecified: Secondary | ICD-10-CM

## 2019-07-26 DIAGNOSIS — E669 Obesity, unspecified: Secondary | ICD-10-CM

## 2019-07-26 MED ORDER — SIMVASTATIN 40 MG PO TABS: 40.0000 mg | ORAL_TABLET | Freq: Every day | ORAL | 4 refills | Status: DC

## 2019-07-26 MED ORDER — FENOFIBRATE 145 MG PO TABS: 145.0000 mg | ORAL_TABLET | Freq: Every day | ORAL | 6 refills | Status: DC

## 2019-07-26 NOTE — Progress Notes (Signed)
There were no vitals taken for this visit.   Subjective:    Patient ID: [name], female    DOB: October 21, 1980, 38 y.o.   MRN: [id number]  HPI: [name] is a 38 y.o. female presenting on 07/26/2019 for No chief complaint on file.   HPI   This is a telemedicine appointment through [employer] due to coronavirus pandemic  I connected with  [name] on 07/26/19 by a video enabled telemedicine application and verified that I am speaking with the correct person using two identifiers.   I discussed the limitations of evaluation and management by telemedicine. The patient expressed understanding and agreed to proceed.  Pt is at home.  Provider is at office    Pt says she is doing well.  Her Mood is good.  She is Sleeping normally.  She is getting out, exercising some.     Her children are staying home-  she is not sending them back to school at this time due to covid.  She has no complaints today     Relevant past medical, surgical, family and social history reviewed and updated as indicated. Interim medical history since our last visit reviewed. Allergies and medications reviewed and updated.   Current Outpatient Medications:  .  citalopram (CELEXA) 40 MG tablet, Take 1 tablet by mouth once daily, Disp: 30 tablet, Rfl: 0 .  fenofibrate (TRICOR) 145 MG tablet, Take 1 tablet by mouth once daily, Disp: 30 tablet, Rfl: 6 .  IRON PO, Take 1 tablet by mouth daily., Disp: , Rfl:  .  megestrol (MEGACE) 40 MG tablet, TAKE 1 TABLET BY MOUTH THREE TIMES DAILY UNTIL  BLEEDING  STOPS,  THEN  ONCE  DAILY  UNTIL  FOLLOWUP, Disp: 45 tablet, Rfl: 0 .  meloxicam (MOBIC) 15 MG tablet, TAKE 1 TABLET BY MOUTH ONCE DAILY AS NEEDED FOR PAIN, Disp: 30 tablet, Rfl: 0 .  norethindrone (MICRONOR,CAMILA,ERRIN) 0.35 MG tablet, Take 1 tablet (0.35 mg total) by mouth daily., Disp: 1 Package, Rfl: 11 .  simvastatin (ZOCOR) 20 MG tablet, Take 1 tablet (20 mg total) by mouth daily., Disp: 30 tablet,  Rfl: 6 .  albuterol (PROVENTIL) (2.5 MG/3ML) 0.083% nebulizer solution, Nebulize 1 vial q 4-6 hour prn (Patient not taking: Reported on 04/18/2019), Disp: 25 vial, Rfl: 3    Review of Systems  Per HPI unless specifically indicated above     Objective:    There were no vitals taken for this visit.  Wt Readings from Last 3 Encounters:  04/07/19 218 lb 6.4 oz (99.1 kg)  12/06/18 220 lb (99.8 kg)  11/30/18 222 lb 12 oz (101 kg)    Physical Exam Constitutional:      General: She is not in acute distress.    Appearance: Normal appearance. She is obese. She is not ill-appearing.  HENT:     Head: Normocephalic and atraumatic.  Pulmonary:     Effort: No respiratory distress.  Neurological:     Mental Status: She is alert and oriented to person, place, and time.  Psychiatric:        Attention and Perception: Attention normal.        Speech: Speech normal.        Behavior: Behavior is cooperative.     Results for orders placed or performed during the hospital encounter of 07/22/19  Hemoglobin and hematocrit, blood  Result Value Ref Range   Hemoglobin 13.5 12.0 - 15.0 g/dL   HCT 40.5 36.0 -  46.0 %  Lipid panel  Result Value Ref Range   Cholesterol 197 0 - 200 mg/dL   Triglycerides 716 (H) <150 mg/dL   HDL 25 (L) >96 mg/dL   Total CHOL/HDL Ratio 7.9 RATIO   VLDL 33 0 - 40 mg/dL   LDL Cholesterol 789 (H) 0 - 99 mg/dL  Comprehensive metabolic panel  Result Value Ref Range   Sodium 139 135 - 145 mmol/L   Potassium 4.1 3.5 - 5.1 mmol/L   Chloride 112 (H) 98 - 111 mmol/L   CO2 19 (L) 22 - 32 mmol/L   Glucose, Bld 93 70 - 99 mg/dL   BUN 14 6 - 20 mg/dL   Creatinine, Ser 3.81 0.44 - 1.00 mg/dL   Calcium 9.7 8.9 - 01.7 mg/dL   Total Protein 7.7 6.5 - 8.1 g/dL   Albumin 4.4 3.5 - 5.0 g/dL   AST 30 15 - 41 U/L   ALT 41 0 - 44 U/L   Alkaline Phosphatase 34 (L) 38 - 126 U/L   Total Bilirubin 0.3 0.3 - 1.2 mg/dL   GFR calc non Af Amer >60 >60 mL/min   GFR calc Af Amer >60 >60  mL/min   Anion gap 8 5 - 15      Assessment & Plan:    Encounter Diagnoses  Name Primary?  . Hyperlipidemia, unspecified hyperlipidemia type Yes  . Tobacco use disorder   . Anxiety   . Obesity, unspecified classification, unspecified obesity type, unspecified whether serious comorbidity present     -reviewed labs with pt -pt to continue current meds and Increase simvastatin to 40mg .  She is to continue lowfat diet and recommended regular exercise -encouraged smoking cesstaion -pt to follow up 3 months.  She would like for her follow-up appointment in 3 months to be via telemedicine.  She is to contact office sooner prn

## 2019-08-01 ENCOUNTER — Other Ambulatory Visit: Payer: Self-pay | Admitting: Obstetrics and Gynecology

## 2019-08-01 NOTE — Telephone Encounter (Signed)
Megace refilled x 3

## 2019-08-09 ENCOUNTER — Other Ambulatory Visit: Payer: Self-pay

## 2019-08-09 DIAGNOSIS — Z20822 Contact with and (suspected) exposure to covid-19: Secondary | ICD-10-CM

## 2019-08-10 LAB — NOVEL CORONAVIRUS, NAA: SARS-CoV-2, NAA: NOT DETECTED

## 2019-08-11 ENCOUNTER — Telehealth: Payer: Self-pay | Admitting: Physician Assistant

## 2019-08-11 NOTE — Telephone Encounter (Signed)
Negative COVID results given. Patient results "NOT Detected." Caller expressed understanding. ° °

## 2019-08-23 ENCOUNTER — Encounter: Payer: Self-pay | Admitting: Physician Assistant

## 2019-08-23 ENCOUNTER — Ambulatory Visit: Payer: Self-pay | Admitting: Physician Assistant

## 2019-08-23 ENCOUNTER — Other Ambulatory Visit: Payer: Self-pay

## 2019-08-23 ENCOUNTER — Ambulatory Visit (HOSPITAL_COMMUNITY)
Admission: RE | Admit: 2019-08-23 | Discharge: 2019-08-23 | Disposition: A | Discharge status: Home / Self Care | Payer: Self-pay | Source: Ambulatory Visit | Attending: Physician Assistant | Admitting: Physician Assistant

## 2019-08-23 DIAGNOSIS — M25551 Pain in right hip: Secondary | ICD-10-CM | POA: Insufficient documentation

## 2019-08-23 NOTE — Progress Notes (Signed)
There were no vitals taken for this visit.   Subjective:    Patient ID: [name], female    DOB: 1981-03-14, 38 y.o.   MRN: [id number]  HPI: EARLY ORD is a 38 y.o. female presenting on 08/23/2019 for No chief complaint on file.   HPI   This is a telemedicine appointment through [employer] due to coronavirus pandemic.  I connected with  [name] on 08/23/19 by a video enabled telemedicine application and verified that I am speaking with the correct person using two identifiers.   I discussed the limitations of evaluation and management by telemedicine. The patient expressed understanding and agreed to proceed.  Patient is at home.  Provider is at office.   Patient complains of right hip pain for 2 to 3 months now.  She says it hurts with any activity including walking.  It also hurts when she is laying on the right side in bed.  She denies any injury.      Relevant past medical, surgical, family and social history reviewed and updated as indicated. Interim medical history since our last visit reviewed. Allergies and medications reviewed and updated.   Current Outpatient Medications:  .  citalopram (CELEXA) 40 MG tablet, Take 1 tablet by mouth once daily, Disp: 30 tablet, Rfl: 0 .  fenofibrate (TRICOR) 145 MG tablet, Take 1 tablet (145 mg total) by mouth daily., Disp: 30 tablet, Rfl: 6 .  IRON PO, Take 1 tablet by mouth daily., Disp: , Rfl:  .  megestrol (MEGACE) 40 MG tablet, TAKE 1 TABLET BY MOUTH THREE TIMES DAILY UNTIL  BLEEDING  STOPS,  THEN  ONCE  DAILY  UNTIL  FOLLOW-UP, Disp: 45 tablet, Rfl: 2 .  meloxicam (MOBIC) 15 MG tablet, TAKE 1 TABLET BY MOUTH ONCE DAILY AS NEEDED FOR PAIN, Disp: 30 tablet, Rfl: 0 .  norethindrone (MICRONOR,CAMILA,ERRIN) 0.35 MG tablet, Take 1 tablet (0.35 mg total) by mouth daily., Disp: 1 Package, Rfl: 11 .  simvastatin (ZOCOR) 40 MG tablet, Take 1 tablet (40 mg total) by mouth at bedtime., Disp: 30 tablet, Rfl: 4 .  albuterol  (PROVENTIL) (2.5 MG/3ML) 0.083% nebulizer solution, Nebulize 1 vial q 4-6 hour prn (Patient not taking: Reported on 04/18/2019), Disp: 25 vial, Rfl: 3   Review of Systems  Per HPI unless specifically indicated above     Objective:    There were no vitals taken for this visit.  Wt Readings from Last 3 Encounters:  04/07/19 218 lb 6.4 oz (99.1 kg)  12/06/18 220 lb (99.8 kg)  11/30/18 222 lb 12 oz (101 kg)    Physical Exam Constitutional:      General: She is not in acute distress.    Appearance: Normal appearance. She is obese. She is not ill-appearing.  HENT:     Head: Normocephalic and atraumatic.  Pulmonary:     Effort: Pulmonary effort is normal. No respiratory distress.  Musculoskeletal:     Right hip: She exhibits normal range of motion, normal strength and no deformity.     Comments: Pain with ROM testing  Neurological:     Mental Status: She is alert and oriented to person, place, and time.  Psychiatric:        Attention and Perception: Attention normal.        Mood and Affect: Mood normal.        Speech: Speech normal.        Behavior: Behavior is cooperative.  Assessment & Plan:    Encounter Diagnosis  Name Primary?  . Right hip pain Yes     We will get x-ray of the right hip..  Patient has Mobic that she can take as needed.  She is reminded to take this with food.  Patient can try ice/heat on the hip.  Discussed possible referral to orthopedist upon the results of the x-ray.  Patient has application for [employer] financial assistance.  She is reminded that she needs to fill it out and get it turned in so she can get approved.  Patient has routine follow-up in January.  Patient will be called with results of x-ray.  patient states understanding of plan as and is in agreement

## 2019-08-31 ENCOUNTER — Other Ambulatory Visit: Payer: Self-pay | Admitting: Physician Assistant

## 2019-10-19 ENCOUNTER — Ambulatory Visit: Payer: Self-pay | Attending: Internal Medicine

## 2019-10-19 ENCOUNTER — Encounter: Payer: Self-pay | Admitting: Physician Assistant

## 2019-10-19 ENCOUNTER — Ambulatory Visit: Payer: Self-pay | Admitting: Physician Assistant

## 2019-10-19 ENCOUNTER — Other Ambulatory Visit: Payer: Self-pay

## 2019-10-19 DIAGNOSIS — F17200 Nicotine dependence, unspecified, uncomplicated: Secondary | ICD-10-CM

## 2019-10-19 DIAGNOSIS — R062 Wheezing: Secondary | ICD-10-CM

## 2019-10-19 DIAGNOSIS — R05 Cough: Secondary | ICD-10-CM

## 2019-10-19 DIAGNOSIS — Z20822 Contact with and (suspected) exposure to covid-19: Secondary | ICD-10-CM

## 2019-10-19 DIAGNOSIS — R059 Cough, unspecified: Secondary | ICD-10-CM

## 2019-10-19 DIAGNOSIS — R0981 Nasal congestion: Secondary | ICD-10-CM

## 2019-10-19 MED ORDER — ALBUTEROL SULFATE (2.5 MG/3ML) 0.083% IN NEBU: INHALATION_SOLUTION | RESPIRATORY_TRACT | 3 refills | Status: DC

## 2019-10-19 MED ORDER — PREDNISONE 20 MG PO TABS: 20.0000 mg | ORAL_TABLET | Freq: Two times a day (BID) | ORAL | 0 refills | Status: AC

## 2019-10-19 NOTE — Progress Notes (Signed)
There were no vitals taken for this visit.   Subjective:    Patient ID: [name], female    DOB: Mar 12, 1981, 39 y.o.   MRN: [id number]  HPI: [name] is a 39 y.o. female presenting on 10/19/2019 for Cough (since 10-16-19. pt c/o cough, congestion, runny nose, intermittent HA. pt has been taking day and night time mucinex which has not helped)   HPI  This is a telemedicine appointment through [employer] due to coronavirus pandemic  I connected with  Pauline A Danese on 10/19/2019 by a video enabled telemedicine application and verified that I am speaking with the correct person using two identifiers.   I discussed the limitations of evaluation and management by telemedicine. The patient expressed understanding and agreed to proceed.  Pt is at home.  Provider is at office.     Pt sick as above.  She used nebulizer once yesterday nad it helped but only for a while.   She is still smoking some.  She has had No fever.    Pt's husband was recently sick with viral illness   Relevant past medical, surgical, family and social history reviewed and updated as indicated. Interim medical history since our last visit reviewed. Allergies and medications reviewed and updated.   Current Outpatient Medications:  .  albuterol (PROVENTIL) (2.5 MG/3ML) 0.083% nebulizer solution, Nebulize 1 vial q 4-6 hour prn, Disp: 25 vial, Rfl: 3 .  citalopram (CELEXA) 40 MG tablet, Take 1 tablet by mouth once daily, Disp: 30 tablet, Rfl: 2 .  fenofibrate (TRICOR) 145 MG tablet, Take 1 tablet (145 mg total) by mouth daily., Disp: 30 tablet, Rfl: 6 .  IRON PO, Take 1 tablet by mouth daily., Disp: , Rfl:  .  megestrol (MEGACE) 40 MG tablet, TAKE 1 TABLET BY MOUTH THREE TIMES DAILY UNTIL  BLEEDING  STOPS,  THEN  ONCE  DAILY  UNTIL  FOLLOW-UP, Disp: 45 tablet, Rfl: 2 .  meloxicam (MOBIC) 15 MG tablet, TAKE 1 TABLET BY MOUTH ONCE DAILY AS NEEDED FOR PAIN, Disp: 30 tablet, Rfl: 0 .  norethindrone  (MICRONOR,CAMILA,ERRIN) 0.35 MG tablet, Take 1 tablet (0.35 mg total) by mouth daily., Disp: 1 Package, Rfl: 11 .  simvastatin (ZOCOR) 40 MG tablet, Take 1 tablet (40 mg total) by mouth at bedtime., Disp: 30 tablet, Rfl: 4    Review of Systems  Per HPI unless specifically indicated above     Objective:    There were no vitals taken for this visit.  Wt Readings from Last 3 Encounters:  04/07/19 218 lb 6.4 oz (99.1 kg)  12/06/18 220 lb (99.8 kg)  11/30/18 222 lb 12 oz (101 kg)    Physical Exam Constitutional:      General: She is not in acute distress.    Appearance: She is ill-appearing. She is not toxic-appearing.  HENT:     Head: Normocephalic and atraumatic.     Nose: Congestion and rhinorrhea present.  Pulmonary:     Effort: Pulmonary effort is normal. No respiratory distress.     Comments: No audible wheezing observed Neurological:     Mental Status: She is alert and oriented to person, place, and time.  Psychiatric:        Attention and Perception: Attention normal.        Mood and Affect: Mood normal.        Speech: Speech normal.        Behavior: Behavior is cooperative.  Assessment & Plan:    Encounter Diagnoses  Name Primary?  . Suspected 2019 novel coronavirus infection Yes  . Wheezing   . Cough   . Head congestion   . Tobacco use disorder      -Pt scheduled for coronavirus testing -rx albuterol for Nebulizer for to use as needed.  rx prednisone -counseled on self-isolation -encouraged pt to avoid smoking -discussed reasons for going to ER including problems breathing.  Pt states understanding.  She is to notify office if she fails to improve or develops new symptoms

## 2019-10-20 ENCOUNTER — Telehealth: Payer: Self-pay | Admitting: *Deleted

## 2019-10-20 ENCOUNTER — Ambulatory Visit: Payer: Self-pay

## 2019-10-20 NOTE — Telephone Encounter (Signed)
Calling for Covid 19 results. Test still processing. Sent a MyChart code via phone as discussed with the patient.

## 2019-10-20 NOTE — Telephone Encounter (Signed)
Patient calling for covid test results

## 2019-10-20 NOTE — Telephone Encounter (Signed)
Pt notified that her covid 19 test results are not back.She voiced understanding.

## 2019-10-20 NOTE — Telephone Encounter (Signed)
Pt calling for covid results, active, verbalizes understanding not resulted as of yet.

## 2019-10-21 ENCOUNTER — Telehealth: Payer: Self-pay | Admitting: Physician Assistant

## 2019-10-21 LAB — NOVEL CORONAVIRUS, NAA: SARS-CoV-2, NAA: NOT DETECTED

## 2019-10-21 NOTE — Telephone Encounter (Signed)
Pt was called and discussed Covid test results which was negative.  All questions answered

## 2019-10-31 ENCOUNTER — Other Ambulatory Visit: Payer: Self-pay | Admitting: Physician Assistant

## 2019-10-31 MED ORDER — SIMVASTATIN 40 MG PO TABS: 40.0000 mg | ORAL_TABLET | Freq: Every day | ORAL | 4 refills | Status: DC

## 2019-11-01 ENCOUNTER — Encounter: Payer: Self-pay | Admitting: Physician Assistant

## 2019-11-01 ENCOUNTER — Ambulatory Visit: Payer: Self-pay | Admitting: Physician Assistant

## 2019-11-01 DIAGNOSIS — F419 Anxiety disorder, unspecified: Secondary | ICD-10-CM

## 2019-11-01 DIAGNOSIS — E669 Obesity, unspecified: Secondary | ICD-10-CM

## 2019-11-01 DIAGNOSIS — F17200 Nicotine dependence, unspecified, uncomplicated: Secondary | ICD-10-CM

## 2019-11-01 DIAGNOSIS — E785 Hyperlipidemia, unspecified: Secondary | ICD-10-CM

## 2019-11-01 NOTE — Progress Notes (Signed)
There were no vitals taken for this visit.   Subjective:    Patient ID: [name], female    DOB: 07/09/1981, 39 y.o.   MRN: [id number]  HPI: [name] is a 39 y.o. female presenting on 11/01/2019 for No chief complaint on file.   HPI  This is a telemedicine appointment through [facility] due to coronavirus pandemic.  I connected with  Sherran A Edelen on 11/01/19 by a video enabled telemedicine application and verified that I am speaking with the correct person using two identifiers.   I discussed the limitations of evaluation and management by telemedicine. The patient expressed understanding and agreed to proceed.  Pt is at home.  Provider is at office.     Pt appointment today is for routine follow up on dyslipidemia and anxiety.  She still some cough and congestion from her recent illness.  Her covid test was negative.  She says she Feels mostly okay.  She is Not wheezing except some at night- she uses nebs at night which helps  She says her Mood is okay.  She is staying active but has gained some weight.  She denies depression.      Relevant past medical, surgical, family and social history reviewed and updated as indicated. Interim medical history since our last visit reviewed. Allergies and medications reviewed and updated.   Current Outpatient Medications:  .  albuterol (PROVENTIL) (2.5 MG/3ML) 0.083% nebulizer solution, Nebulize 1 vial q 4-6 hour prn, Disp: 75 mL, Rfl: 3 .  citalopram (CELEXA) 40 MG tablet, Take 1 tablet by mouth once daily, Disp: 30 tablet, Rfl: 2 .  fenofibrate (TRICOR) 145 MG tablet, Take 1 tablet (145 mg total) by mouth daily., Disp: 30 tablet, Rfl: 6 .  IRON PO, Take 1 tablet by mouth daily., Disp: , Rfl:  .  megestrol (MEGACE) 40 MG tablet, TAKE 1 TABLET BY MOUTH THREE TIMES DAILY UNTIL  BLEEDING  STOPS,  THEN  ONCE  DAILY  UNTIL  FOLLOW-UP, Disp: 45 tablet, Rfl: 2 .  meloxicam (MOBIC) 15 MG tablet, TAKE 1 TABLET BY MOUTH ONCE DAILY AS NEEDED  FOR PAIN, Disp: 30 tablet, Rfl: 0 .  norethindrone (MICRONOR,CAMILA,ERRIN) 0.35 MG tablet, Take 1 tablet (0.35 mg total) by mouth daily., Disp: 1 Package, Rfl: 11 .  simvastatin (ZOCOR) 40 MG tablet, Take 1 tablet (40 mg total) by mouth at bedtime., Disp: 30 tablet, Rfl: 4    Review of Systems  Per HPI unless specifically indicated above     Objective:    There were no vitals taken for this visit.  Wt Readings from Last 3 Encounters:  04/07/19 218 lb 6.4 oz (99.1 kg)  12/06/18 220 lb (99.8 kg)  11/30/18 222 lb 12 oz (101 kg)    Physical Exam Constitutional:      General: She is not in acute distress.    Appearance: She is obese. She is not ill-appearing.  HENT:     Head: Normocephalic and atraumatic.  Pulmonary:     Effort: Pulmonary effort is normal. No respiratory distress.  Neurological:     Mental Status: She is alert and oriented to person, place, and time.  Psychiatric:        Attention and Perception: Attention normal.        Speech: Speech normal.        Behavior: Behavior is cooperative.          Assessment & Plan:    Encounter Diagnoses  Name  Primary?  . Hyperlipidemia, unspecified hyperlipidemia type Yes  . Anxiety   . Tobacco use disorder   . Obesity, unspecified classification, unspecified obesity type, unspecified whether serious comorbidity present     -Pt to get labs drawn- she will be called with results -Counseled on healthy diet and regular exercise -she is to Continue her current rx -encouraged smoking cessation -pt to follow up 3 months.  She is to contact office sooner prn

## 2019-11-16 ENCOUNTER — Other Ambulatory Visit: Payer: Self-pay | Admitting: Obstetrics and Gynecology

## 2019-11-29 ENCOUNTER — Other Ambulatory Visit: Payer: Self-pay

## 2019-11-29 ENCOUNTER — Other Ambulatory Visit (HOSPITAL_COMMUNITY)
Admission: RE | Admit: 2019-11-29 | Discharge: 2019-11-29 | Disposition: A | Discharge status: Home / Self Care | Payer: Self-pay | Source: Ambulatory Visit | Attending: Physician Assistant | Admitting: Physician Assistant

## 2019-11-29 DIAGNOSIS — E785 Hyperlipidemia, unspecified: Secondary | ICD-10-CM | POA: Insufficient documentation

## 2019-11-29 LAB — COMPREHENSIVE METABOLIC PANEL
ALT: 51 U/L — ABNORMAL HIGH (ref 0–44)
AST: 34 U/L (ref 15–41)
Albumin: 4.4 g/dL (ref 3.5–5.0)
Alkaline Phosphatase: 33 U/L — ABNORMAL LOW (ref 38–126)
Anion gap: 7 (ref 5–15)
BUN: 12 mg/dL (ref 6–20)
CO2: 23 mmol/L (ref 22–32)
Calcium: 9.6 mg/dL (ref 8.9–10.3)
Chloride: 109 mmol/L (ref 98–111)
Creatinine, Ser: 0.94 mg/dL (ref 0.44–1.00)
GFR calc Af Amer: >60 mL/min (ref >60)
GFR calc non Af Amer: >60 mL/min (ref >60)
Glucose, Bld: 80 mg/dL (ref 70–99)
Potassium: 3.9 mmol/L (ref 3.5–5.1)
Sodium: 139 mmol/L (ref 135–145)
Total Bilirubin: 0.3 mg/dL (ref 0.3–1.2)
Total Protein: 7.6 g/dL (ref 6.5–8.1)

## 2019-11-29 LAB — LIPID PANEL
Cholesterol: 167 mg/dL (ref 0–200)
HDL: 27 mg/dL — ABNORMAL LOW (ref >40)
LDL Cholesterol: 112 mg/dL — ABNORMAL HIGH (ref 0–99)
Total CHOL/HDL Ratio: 6.2 RATIO
Triglycerides: 142 mg/dL (ref <150)
VLDL: 28 mg/dL (ref 0–40)

## 2019-12-01 ENCOUNTER — Other Ambulatory Visit: Payer: Self-pay | Admitting: Physician Assistant

## 2019-12-15 ENCOUNTER — Other Ambulatory Visit: Payer: Self-pay | Admitting: Obstetrics and Gynecology

## 2020-01-17 ENCOUNTER — Ambulatory Visit: Payer: Self-pay | Admitting: Physician Assistant

## 2020-01-17 ENCOUNTER — Encounter: Payer: Self-pay | Admitting: Physician Assistant

## 2020-01-17 DIAGNOSIS — K529 Noninfective gastroenteritis and colitis, unspecified: Secondary | ICD-10-CM

## 2020-01-17 MED ORDER — PROMETHAZINE HCL 25 MG PO TABS: 25.0000 mg | ORAL_TABLET | Freq: Three times a day (TID) | ORAL | 0 refills | Status: DC | PRN

## 2020-01-17 MED ORDER — DIPHENOXYLATE-ATROPINE 2.5-0.025 MG PO TABS: 2.0000 | ORAL_TABLET | Freq: Four times a day (QID) | ORAL | 0 refills | Status: DC | PRN

## 2020-01-17 NOTE — Progress Notes (Signed)
There were no vitals taken for this visit.   Subjective:    Patient ID: [name], female    DOB: [birth year], 39 y.o.   MRN: [id number]  HPI: [name] is a 39 y.o. female presenting on 01/17/2020 for No chief complaint on file.   HPI  This is a telemedicine appointment through [facility] due to coronavirus pandemic  I connected with  [name] on 01/17/20 by a video enabled telemedicine application and verified that I am speaking with the correct person using two identifiers.   I discussed the limitations of evaluation and management by telemedicine. The patient expressed understanding and agreed to proceed.  Pt is at home.  Provider is at office.     Pt started with stomach problems Saturday.  Her husband started with them on Sunday.    She is having emesis, nausea, diarrhea, chills, cramps.   Last emesis was yesterday.  She took phenergan and it helped.  Her last diarrhea was just prior to this appt.   It is like like water.  No blood.  She is having about 10 or 12 episodes diarrhea daily.     She is eating a little bit but is scared to eat much.  She is drinking fluids okay.    Pt's husband has it/GI problems.  Her son had it one day and pt's daughter had it 2 days.  Both children are now well.  Pt's husband is no longer throwing up but still has some diarrhea.   She has not traveled out of town lately.    No camping.  No hiking or drinking from streams.    No different restaurants- mostly eating at home due to covid.    Today is 4th day pt feeling bad.     She took pepto for diarrhea without improvement.      Relevant past medical, surgical, family and social history reviewed and updated as indicated. Interim medical history since our last visit reviewed. Allergies and medications reviewed and updated.   Current Outpatient Medications:  .  citalopram (CELEXA) 40 MG tablet, Take 1 tablet by mouth once daily, Disp: 30 tablet, Rfl: 1 .  fenofibrate (TRICOR) 145  MG tablet, Take 1 tablet (145 mg total) by mouth daily., Disp: 30 tablet, Rfl: 6 .  IRON PO, Take 1 tablet by mouth daily., Disp: , Rfl:  .  megestrol (MEGACE) 40 MG tablet, TAKE 1 TABLET BY MOUTH THREE TIMES DAILY UNTIL  BLEEDING  STOPS,  THEN  ONCE  DAILY  UNTIL  FOLLOW-UP, Disp: 45 tablet, Rfl: 0 .  meloxicam (MOBIC) 15 MG tablet, TAKE 1 TABLET BY MOUTH ONCE DAILY AS NEEDED FOR PAIN, Disp: 30 tablet, Rfl: 0 .  norethindrone (MICRONOR) 0.35 MG tablet, Take 1 tablet by mouth once daily, Disp: 28 tablet, Rfl: 6 .  simvastatin (ZOCOR) 40 MG tablet, Take 1 tablet (40 mg total) by mouth at bedtime., Disp: 30 tablet, Rfl: 4 .  albuterol (PROVENTIL) (2.5 MG/3ML) 0.083% nebulizer solution, Nebulize 1 vial q 4-6 hour prn (Patient not taking: Reported on 01/17/2020), Disp: 75 mL, Rfl: 3     Review of Systems  Per HPI unless specifically indicated above     Objective:    There were no vitals taken for this visit.  Wt Readings from Last 3 Encounters:  04/07/19 218 lb 6.4 oz (99.1 kg)  12/06/18 220 lb (99.8 kg)  11/30/18 222 lb 12 oz (101 kg)    Physical Exam  Constitutional:      General: She is not in acute distress.    Appearance: She is not toxic-appearing.  HENT:     Head: Normocephalic and atraumatic.  Pulmonary:     Effort: Pulmonary effort is normal. No respiratory distress.  Neurological:     Mental Status: She is alert and oriented to person, place, and time.  Psychiatric:        Attention and Perception: Attention normal.        Speech: Speech normal.        Behavior: Behavior normal. Behavior is cooperative.           Assessment & Plan:   Encounter Diagnosis  Name Primary?  . AGE (acute gastroenteritis) Yes    -rx lomotil and phenergan - ([facility]- [facility]) -pt told to avoid driving on phenergan due to sedation -pt counseled to drink plenty of fluids.  Counseled to avoid dairy.  Gently advance diet as tolerated/bland diet. -Note for [facility]- out April 5-6---- RTW April 7-  sent to pt's email -pt and/or her husband to contact office if not improving 24-48 hours or for worsening or new symptoms.   She states understanding

## 2020-01-31 ENCOUNTER — Ambulatory Visit: Payer: Self-pay | Admitting: Physician Assistant

## 2020-01-31 ENCOUNTER — Encounter: Payer: Self-pay | Admitting: Physician Assistant

## 2020-01-31 ENCOUNTER — Other Ambulatory Visit: Payer: Self-pay

## 2020-01-31 VITALS — BP 120/76 | HR 78 | Temp 97.9°F | Wt 237.7 lb

## 2020-01-31 DIAGNOSIS — F419 Anxiety disorder, unspecified: Secondary | ICD-10-CM

## 2020-01-31 DIAGNOSIS — E785 Hyperlipidemia, unspecified: Secondary | ICD-10-CM

## 2020-01-31 DIAGNOSIS — E669 Obesity, unspecified: Secondary | ICD-10-CM

## 2020-01-31 DIAGNOSIS — M25551 Pain in right hip: Secondary | ICD-10-CM

## 2020-01-31 NOTE — Progress Notes (Signed)
BP 120/76   Pulse 78   Temp 97.9 F (36.6 C)   Wt 237 lb 11.2 oz (107.8 kg)   SpO2 98%   BMI 40.80 kg/m    Subjective:    Patient ID: [name], female    DOB: May 16, 1981, 39 y.o.   MRN: [id number]  HPI: [name] is a 39 y.o. female presenting on 01/31/2020 for Mental Health Problem and Hyperlipidemia   HPI  Pt had a negative covid 19 screening questionnaire    Pt here for follow up mood and cholesterol.  It is too soon for her lipid labs to be checked..    Pt says she is doing good.  She is very stressed but doing well.  She quit smoking feb 1!     Pt's father in law in hospital with MI and CABG and pt's father recently diagnosed with cirrhosis.  Also she recently adopted her 24 month old niece because the child's parents have drug issues.   Pt wonders about [employer] / [employer] financial assistance so she can get to orthopedics for her hip.  It continues to bother her since she was here in November.  Her xray was unremarkable.     Relevant past medical, surgical, family and social history reviewed and updated as indicated. Interim medical history since our last visit reviewed. Allergies and medications reviewed and updated.    Current Outpatient Medications:  .  albuterol (PROVENTIL) (2.5 MG/3ML) 0.083% nebulizer solution, Nebulize 1 vial q 4-6 hour prn, Disp: 75 mL, Rfl: 3 .  citalopram (CELEXA) 40 MG tablet, Take 1 tablet by mouth once daily, Disp: 30 tablet, Rfl: 1 .  fenofibrate (TRICOR) 145 MG tablet, Take 1 tablet (145 mg total) by mouth daily., Disp: 30 tablet, Rfl: 6 .  IRON PO, Take 1 tablet by mouth daily., Disp: , Rfl:  .  megestrol (MEGACE) 40 MG tablet, TAKE 1 TABLET BY MOUTH THREE TIMES DAILY UNTIL  BLEEDING  STOPS,  THEN  ONCE  DAILY  UNTIL  FOLLOW-UP, Disp: 45 tablet, Rfl: 0 .  meloxicam (MOBIC) 15 MG tablet, TAKE 1 TABLET BY MOUTH ONCE DAILY AS NEEDED FOR PAIN, Disp: 30 tablet, Rfl: 0 .  norethindrone (MICRONOR) 0.35 MG tablet, Take 1 tablet by mouth  once daily, Disp: 28 tablet, Rfl: 6 .  simvastatin (ZOCOR) 40 MG tablet, Take 1 tablet (40 mg total) by mouth at bedtime., Disp: 30 tablet, Rfl: 4     Review of Systems  Per HPI unless specifically indicated above     Objective:    BP 120/76   Pulse 78   Temp 97.9 F (36.6 C)   Wt 237 lb 11.2 oz (107.8 kg)   SpO2 98%   BMI 40.80 kg/m   Wt Readings from Last 3 Encounters:  01/31/20 237 lb 11.2 oz (107.8 kg)  04/07/19 218 lb 6.4 oz (99.1 kg)  12/06/18 220 lb (99.8 kg)    Physical Exam Vitals reviewed.  Constitutional:      Appearance: She is well-developed.  HENT:     Head: Normocephalic and atraumatic.  Cardiovascular:     Rate and Rhythm: Normal rate and regular rhythm.  Pulmonary:     Effort: Pulmonary effort is normal.     Breath sounds: Normal breath sounds.  Abdominal:     General: Bowel sounds are normal.     Palpations: Abdomen is soft. There is no mass.     Tenderness: There is no abdominal tenderness.  Musculoskeletal:  Cervical back: Neck supple.  Lymphadenopathy:     Cervical: No cervical adenopathy.  Skin:    General: Skin is warm and dry.  Neurological:     Mental Status: She is alert and oriented to person, place, and time.  Psychiatric:        Behavior: Behavior normal.            Assessment & Plan:   Encounter Diagnoses  Name Primary?  Marland Kitchen Anxiety Yes  . Hyperlipidemia, unspecified hyperlipidemia type   . Obesity, unspecified classification, unspecified obesity type, unspecified whether serious comorbidity present   . Right hip pain       -Refer to orthopedics when approved for [employer] financial assistance -pt to Continue current rx -pt to reschedule her covid vaccination -congratulated pt on stopping smoking!  Encouraged her to keep this up -pt to follow up 3 months.  She is to contact office sooner prn

## 2020-02-03 ENCOUNTER — Other Ambulatory Visit: Payer: Self-pay | Admitting: Obstetrics and Gynecology

## 2020-02-03 ENCOUNTER — Other Ambulatory Visit: Payer: Self-pay | Admitting: Physician Assistant

## 2020-02-28 ENCOUNTER — Encounter: Payer: Self-pay | Admitting: Orthopaedic Surgery

## 2020-02-28 ENCOUNTER — Other Ambulatory Visit: Payer: Self-pay

## 2020-02-28 ENCOUNTER — Ambulatory Visit (INDEPENDENT_AMBULATORY_CARE_PROVIDER_SITE_OTHER): Payer: Self-pay | Admitting: Orthopaedic Surgery

## 2020-02-28 VITALS — BP 152/96 | HR 77 | Ht 66.0 in | Wt 244.2 lb

## 2020-02-28 DIAGNOSIS — M7061 Trochanteric bursitis, right hip: Secondary | ICD-10-CM

## 2020-02-28 MED ORDER — NAPROXEN 500 MG PO TABS: 500.0000 mg | ORAL_TABLET | Freq: Two times a day (BID) | ORAL | 5 refills | Status: DC

## 2020-02-28 NOTE — Progress Notes (Signed)
Subjective:    Patient ID: [name], female    DOB: 11/27/1980, 39 y.o.   MRN: [id number]  HPI She has had pain in the right hip laterally since Halloween/early November.  She has pain rolling on it in bed and when first standing.  The pain is localized to the lateral hip area.  It can be intense just a few seconds.  She has tried ice, heat, rubs, Advil, exercise with no help.  She had X-rays of the right hip done 08-22-2020 which were negative.  I have independently reviewed and interpreted x-rays of this patient done at another site by another physician or qualified health professional.  She has no trauma, no redness, no swelling.  She has no numbness.   Review of Systems  Constitutional: Positive for activity change.  Musculoskeletal: Positive for arthralgias and gait problem.  All other systems reviewed and are negative.  For Review of Systems, all other systems reviewed and are negative.  The following is a summary of the past history medically, past history surgically, known current medicines, social history and family history.  This information is gathered electronically by the computer from prior information and documentation.  I review this each visit and have found including this information at this point in the chart is beneficial and informative.   Past Medical History:  Diagnosis Date  . Hypercholesteremia     Past Surgical History:  Procedure Laterality Date  . CHOLECYSTECTOMY N/A 07/13/2014   Procedure: LAPAROSCOPIC CHOLECYSTECTOMY;  Surgeon: Scherry Ran, MD;  Location: [facility];  Service: General;  Laterality: N/A;  . DENTAL SURGERY  2018   plate top & bottom  . TUBAL LIGATION      Current Outpatient Medications on File Prior to Visit  Medication Sig Dispense Refill  . albuterol (PROVENTIL) (2.5 MG/3ML) 0.083% nebulizer solution Nebulize 1 vial q 4-6 hour prn (Patient not taking: Reported on 02/28/2020) 75 mL 3  . citalopram (CELEXA) 40 MG tablet Take 1  tablet by mouth once daily 30 tablet 6  . fenofibrate (TRICOR) 145 MG tablet Take 1 tablet (145 mg total) by mouth daily. 30 tablet 6  . IRON PO Take 1 tablet by mouth daily.    . megestrol (MEGACE) 40 MG tablet TAKE 1 TABLET BY MOUTH THREE TIMES DAILY UNTIL  BLEEDING  STOPS,  THEN  ONCE  DAILY  UNTIL  FOLLOW-UP 45 tablet 0  . meloxicam (MOBIC) 15 MG tablet TAKE 1 TABLET BY MOUTH ONCE DAILY AS NEEDED FOR PAIN (Patient not taking: Reported on 02/28/2020) 30 tablet 0  . norethindrone (MICRONOR) 0.35 MG tablet Take 1 tablet by mouth once daily 28 tablet 6  . simvastatin (ZOCOR) 40 MG tablet Take 1 tablet (40 mg total) by mouth at bedtime. 30 tablet 4  . [DISCONTINUED] norethindrone (MICRONOR,CAMILA,ERRIN) 0.35 MG tablet Take 1 tablet (0.35 mg total) by mouth daily. 1 Package 11   No current facility-administered medications on file prior to visit.    Social History   Socioeconomic History  . Marital status: Married    Spouse name: Not on file  . Number of children: 2  . Years of education: Not on file  . Highest education level: Not on file  Occupational History  . Not on file  Tobacco Use  . Smoking status: Former Smoker    Packs/day: 0.50    Years: 15.00    Pack years: 7.50    Types: Cigarettes    Quit date: 11/14/2019  Years since quitting: 0.2  . Smokeless tobacco: Never Used  Substance and Sexual Activity  . Alcohol use: No  . Drug use: No  . Sexual activity: Yes    Birth control/protection: Surgical, Pill  Other Topics Concern  . Not on file  Social History Narrative  . Not on file   Social Determinants of Health   Financial Resource Strain:   . Difficulty of Paying Living Expenses:   Food Insecurity:   . Worried About Programme researcher, broadcasting/film/video in the Last Year:   . Barista in the Last Year:   Transportation Needs:   . Freight forwarder (Medical):   Marland Kitchen Lack of Transportation (Non-Medical):   Physical Activity:   . Days of Exercise per Week:   . Minutes of  Exercise per Session:   Stress:   . Feeling of Stress :   Social Connections:   . Frequency of Communication with Friends and Family:   . Frequency of Social Gatherings with Friends and Family:   . Attends Religious Services:   . Active Member of Clubs or Organizations:   . Attends Banker Meetings:   Marland Kitchen Marital Status:   Intimate Partner Violence:   . Fear of Current or Ex-Partner:   . Emotionally Abused:   Marland Kitchen Physically Abused:   . Sexually Abused:     Family History  Problem Relation Age of Onset  . Heart disease Mother   . Diabetes Mother   . Heart disease Father   . Hypertension Father   . Thyroid disease Father   . Hyperlipidemia Father     BP (!) 152/96   Pulse 77   Ht 5\' 6"  (1.676 m)   Wt 244 lb 4 oz (110.8 kg)   BMI 39.42 kg/m   Body mass index is 39.42 kg/m.      Objective:   Physical Exam Vitals and nursing note reviewed.  Constitutional:      Appearance: She is well-developed.  HENT:     Head: Normocephalic and atraumatic.  Eyes:     Conjunctiva/sclera: Conjunctivae normal.     Pupils: Pupils are equal, round, and reactive to light.  Cardiovascular:     Rate and Rhythm: Normal rate and regular rhythm.  Pulmonary:     Effort: Pulmonary effort is normal.  Abdominal:     Palpations: Abdomen is soft.  Musculoskeletal:     Cervical back: Normal range of motion and neck supple.       Legs:  Skin:    General: Skin is warm and dry.  Neurological:     Mental Status: She is alert and oriented to person, place, and time.     Cranial Nerves: No cranial nerve deficit.     Motor: No abnormal muscle tone.     Coordination: Coordination normal.     Deep Tendon Reflexes: Reflexes are normal and symmetric. Reflexes normal.  Psychiatric:        Behavior: Behavior normal.        Thought Content: Thought content normal.        Judgment: Judgment normal.           Assessment & Plan:   Encounter Diagnosis  Name Primary?  . Trochanteric  bursitis, right hip Yes   PROCEDURE NOTE:  The patient request injection, verbal consent was obtained.  The right trochanteric area of the hip was prepped appropriately after time out was performed.   Sterile technique was observed and injection of  1 cc of Depo-Medrol 40 mg with several cc's of plain xylocaine. Anesthesia was provided by ethyl chloride and a 20-gauge needle was used to inject the hip area. The injection was tolerated well.  A band aid dressing was applied.  The patient was advised to apply ice later today and tomorrow to the injection sight as needed.  I will call in Naprosyn 500 po bid pc.  I told her to use ice tonight.  Return in two weeks.  Call if any problem.  Precautions discussed.   Electronically Signed Darreld Mclean, MD 5/18/20212:05 PM

## 2020-03-13 ENCOUNTER — Encounter: Payer: Self-pay | Admitting: Orthopaedic Surgery

## 2020-03-13 ENCOUNTER — Other Ambulatory Visit: Payer: Self-pay

## 2020-03-13 ENCOUNTER — Ambulatory Visit (INDEPENDENT_AMBULATORY_CARE_PROVIDER_SITE_OTHER): Payer: Self-pay | Admitting: Orthopaedic Surgery

## 2020-03-13 VITALS — Ht 66.0 in | Wt 242.0 lb

## 2020-03-13 DIAGNOSIS — M7061 Trochanteric bursitis, right hip: Secondary | ICD-10-CM

## 2020-03-13 NOTE — Progress Notes (Signed)
PROCEDURE NOTE:  The patient request injection, verbal consent was obtained.  The right trochanteric area of the hip was prepped appropriately after time out was performed.   Sterile technique was observed and injection of 1 cc of Depo-Medrol 40 mg with several cc's of plain xylocaine. Anesthesia was provided by ethyl chloride and a 20-gauge needle was used to inject the hip area. The injection was tolerated well.  A band aid dressing was applied.  The patient was advised to apply ice later today and tomorrow to the injection sight as needed.  Return in three weeks.  Electronically Signed Darreld Mclean, MD 6/1/20219:38 AM

## 2020-03-20 ENCOUNTER — Other Ambulatory Visit: Payer: Self-pay | Admitting: Obstetrics and Gynecology

## 2020-04-03 ENCOUNTER — Encounter: Payer: Self-pay | Admitting: Orthopaedic Surgery

## 2020-04-03 ENCOUNTER — Other Ambulatory Visit: Payer: Self-pay

## 2020-04-03 ENCOUNTER — Ambulatory Visit (INDEPENDENT_AMBULATORY_CARE_PROVIDER_SITE_OTHER): Payer: Self-pay | Admitting: Orthopaedic Surgery

## 2020-04-03 VITALS — BP 110/63 | HR 70 | Ht 66.0 in | Wt 245.0 lb

## 2020-04-03 DIAGNOSIS — M7061 Trochanteric bursitis, right hip: Secondary | ICD-10-CM

## 2020-04-03 MED ORDER — HYDROCODONE-ACETAMINOPHEN 5-325 MG PO TABS: ORAL_TABLET | ORAL | 0 refills | Status: DC

## 2020-04-03 NOTE — Progress Notes (Signed)
PROCEDURE NOTE:  The patient request injection, verbal consent was obtained.  The right trochanteric area of the hip was prepped appropriately after time out was performed.   Sterile technique was observed and injection of 1 cc of Depo-Medrol 40 mg with several cc's of plain xylocaine. Anesthesia was provided by ethyl chloride and a 20-gauge needle was used to inject the hip area. The injection was tolerated well.  A band aid dressing was applied.  The patient was advised to apply ice later today and tomorrow to the injection sight as needed.  Return in two weeks.  I have reviewed the [place] Controlled Substance Reporting System web site prior to prescribing narcotic medicine for this patient.   Electronically Signed Darreld Mclean, MD 6/22/20219:48 AM

## 2020-04-06 ENCOUNTER — Other Ambulatory Visit: Payer: Self-pay | Admitting: Physician Assistant

## 2020-04-17 ENCOUNTER — Other Ambulatory Visit: Payer: Self-pay

## 2020-04-17 ENCOUNTER — Encounter: Payer: Self-pay | Admitting: Orthopaedic Surgery

## 2020-04-17 ENCOUNTER — Other Ambulatory Visit: Payer: Self-pay | Admitting: Physician Assistant

## 2020-04-17 ENCOUNTER — Ambulatory Visit (INDEPENDENT_AMBULATORY_CARE_PROVIDER_SITE_OTHER): Payer: Self-pay | Admitting: Orthopaedic Surgery

## 2020-04-17 VITALS — BP 123/75 | HR 69 | Ht 66.0 in | Wt 245.0 lb

## 2020-04-17 DIAGNOSIS — M7061 Trochanteric bursitis, right hip: Secondary | ICD-10-CM

## 2020-04-17 MED ORDER — HYDROCODONE-ACETAMINOPHEN 5-325 MG PO TABS: ORAL_TABLET | ORAL | 0 refills | Status: DC

## 2020-04-17 MED ORDER — FENOFIBRATE 145 MG PO TABS: 145.0000 mg | ORAL_TABLET | Freq: Every day | ORAL | 6 refills | Status: DC

## 2020-04-17 MED ORDER — PREDNISONE 5 MG (21) PO TBPK
ORAL_TABLET | ORAL | 0 refills | Status: DC
Start: 2020-04-17 — End: 2020-05-01

## 2020-04-17 NOTE — Progress Notes (Signed)
Patient [name]:[id number] A Mancias, female DOB:Oct 10, 1981, 39 y.o. HLK:[id number]  Chief Complaint  Patient presents with  . Hip Pain    Right side  . Medication Refill    HPI  [name] is a 39 y.o. female who has bursitis right hip trochanteric.  She did well from the injection last time until she cut grass this past Sunday.  She is hurting again.  She has no redness, no weakness.  She has normal NV status.   Body mass index is 39.54 kg/m.  ROS  Review of Systems  Constitutional: Positive for activity change.  Musculoskeletal: Positive for arthralgias and gait problem.  All other systems reviewed and are negative.   All other systems reviewed and are negative.  The following is a summary of the past history medically, past history surgically, known current medicines, social history and family history.  This information is gathered electronically by the computer from prior information and documentation.  I review this each visit and have found including this information at this point in the chart is beneficial and informative.    Past Medical History:  Diagnosis Date  . Hypercholesteremia     Past Surgical History:  Procedure Laterality Date  . CHOLECYSTECTOMY N/A 07/13/2014   Procedure: LAPAROSCOPIC CHOLECYSTECTOMY;  Surgeon: Marlane Hatcher, MD;  Location: [facility];  Service: General;  Laterality: N/A;  . DENTAL SURGERY  2018   plate top & bottom  . TUBAL LIGATION      Family History  Problem Relation Age of Onset  . Heart disease Mother   . Diabetes Mother   . Heart disease Father   . Hypertension Father   . Thyroid disease Father   . Hyperlipidemia Father     Social History Social History   Tobacco Use  . Smoking status: Former Smoker    Packs/day: 0.50    Years: 15.00    Pack years: 7.50    Types: Cigarettes    Quit date: 11/14/2019    Years since quitting: 0.4  . Smokeless tobacco: Never Used  Vaping Use  . Vaping Use: Never used  Substance Use Topics   . Alcohol use: No  . Drug use: No    No Known Allergies  Current Outpatient Medications  Medication Sig Dispense Refill  . albuterol (PROVENTIL) (2.5 MG/3ML) 0.083% nebulizer solution Nebulize 1 vial q 4-6 hour prn (Patient not taking: Reported on 02/28/2020) 75 mL 3  . citalopram (CELEXA) 40 MG tablet Take 1 tablet by mouth once daily 30 tablet 6  . fenofibrate (TRICOR) 145 MG tablet Take 1 tablet (145 mg total) by mouth daily. 30 tablet 6  . HYDROcodone-acetaminophen (NORCO/VICODIN) 5-325 MG tablet One tablet every six hours for pain.  Limit 7 days. 28 tablet 0  . IRON PO Take 1 tablet by mouth daily.    . megestrol (MEGACE) 40 MG tablet TAKE 1 TABLET BY MOUTH THREE TIMES DAILY UNTIL  BLEEDING  STOPS  THEN  TAKE  1  TABLET  ONCE  DAILY  UNTIL  FOLLOW-UP 45 tablet 0  . naproxen (NAPROSYN) 500 MG tablet Take 1 tablet (500 mg total) by mouth 2 (two) times daily with a meal. 60 tablet 5  . norethindrone (MICRONOR) 0.35 MG tablet Take 1 tablet by mouth once daily 28 tablet 6  . predniSONE (STERAPRED UNI-PAK 21 TAB) 5 MG (21) TBPK tablet Take 6 pills first day; 5 pills second day; 4 pills third day; 3 pills fourth day; 2 pills next day  and 1 pill last day. 21 tablet 0  . simvastatin (ZOCOR) 40 MG tablet TAKE 1 TABLET BY MOUTH AT BEDTIME 90 tablet 0   No current facility-administered medications for this visit.     Physical Exam  Blood pressure 123/75, pulse 69, height 5\' 6"  (1.676 m), weight 245 lb (111.1 kg).  Constitutional: overall normal hygiene, normal nutrition, well developed, normal grooming, normal body habitus. Assistive device:none  Musculoskeletal: gait and station Limp none, muscle tone and strength are normal, no tremors or atrophy is present.  .  Neurological: coordination overall normal.  Deep tendon reflex/nerve stretch intact.  Sensation normal.  Cranial nerves II-XII intact.   Skin:   Normal overall no scars, lesions, ulcers or rashes. No psoriasis.  Psychiatric:  Alert and oriented x 3.  Recent memory intact, remote memory unclear.  Normal mood and affect. Well groomed.  Good eye contact.  Cardiovascular: overall no swelling, no varicosities, no edema bilaterally, normal temperatures of the legs and arms, no clubbing, cyanosis and good capillary refill.  Lymphatic: palpation is normal.  Right hip is tender over the trochanteric area.  NV intact. ROM is full.  All other systems reviewed and are negative   The patient has been educated about the nature of the problem(s) and counseled on treatment options.  The patient appeared to understand what I have discussed and is in agreement with it.  Encounter Diagnosis  Name Primary?  . Trochanteric bursitis, right hip Yes    PLAN Call if any problems.  Precautions discussed.  Continue current medications. Begin Prednisone dose pack.  Return to clinic 1 month   I have reviewed the [facility] Controlled Substance Reporting System web site prior to prescribing narcotic medicine for this patient.   Electronically Signed [facility], MD 7/6/202110:16 AM

## 2020-05-01 ENCOUNTER — Ambulatory Visit: Payer: Self-pay | Admitting: Physician Assistant

## 2020-05-01 ENCOUNTER — Encounter: Payer: Self-pay | Admitting: Physician Assistant

## 2020-05-01 DIAGNOSIS — M25551 Pain in right hip: Secondary | ICD-10-CM

## 2020-05-01 DIAGNOSIS — E785 Hyperlipidemia, unspecified: Secondary | ICD-10-CM

## 2020-05-01 DIAGNOSIS — F419 Anxiety disorder, unspecified: Secondary | ICD-10-CM

## 2020-05-01 DIAGNOSIS — E669 Obesity, unspecified: Secondary | ICD-10-CM

## 2020-05-01 NOTE — Progress Notes (Signed)
There were no vitals taken for this visit.   Subjective:    Patient ID: [name], female    DOB: 04/15/1981, 39 y.o.   MRN: [id number]  HPI: [name] is a 39 y.o. female presenting on 05/01/2020 for No chief complaint on file.   HPI    Pt was scheduled for in-office appointment today but came with child so her appointment was changed to virtual.  This is a telemedicine appointment through [employer] due to coronavirus pandemic.  I connected with  [name] on 05/01/20 by a video enabled telemedicine application and verified that I am speaking with the correct person using two identifiers.   I discussed the limitations of evaluation and management by telemedicine. The patient expressed understanding and agreed to proceed.  Pt is in her parked car.  Provider is at office.    Pt is 38yoF with dyslipidemia, anxiety, right hip bursitis (seen by dr Hilda Lias), heavy menses (seen by dr Emelda Fear) who has appointment today for routine follow up.  She Didn't get her labs drawn; lab had long wait.  She is planning to go tomorrow.     Pt says she is doing fine.  She has stress at times, somewhat more due to recent addition of 40yo to her family.  She says it's all good, though and she is doing well.    She is still abstaining from smoking!  She has no new complaints today.     Relevant past medical, surgical, family and social history reviewed and updated as indicated. Interim medical history since our last visit reviewed. Allergies and medications reviewed and updated.   Current Outpatient Medications:  .  citalopram (CELEXA) 40 MG tablet, Take 1 tablet by mouth once daily, Disp: 30 tablet, Rfl: 6 .  fenofibrate (TRICOR) 145 MG tablet, Take 1 tablet (145 mg total) by mouth daily., Disp: 30 tablet, Rfl: 6 .  HYDROcodone-acetaminophen (NORCO/VICODIN) 5-325 MG tablet, One tablet every six hours for pain.  Limit 7 days., Disp: 28 tablet, Rfl: 0 .  IRON PO, Take 1 tablet by mouth  daily., Disp: , Rfl:  .  megestrol (MEGACE) 40 MG tablet, TAKE 1 TABLET BY MOUTH THREE TIMES DAILY UNTIL  BLEEDING  STOPS  THEN  TAKE  1  TABLET  ONCE  DAILY  UNTIL  FOLLOW-UP, Disp: 45 tablet, Rfl: 0 .  naproxen (NAPROSYN) 500 MG tablet, Take 1 tablet (500 mg total) by mouth 2 (two) times daily with a meal., Disp: 60 tablet, Rfl: 5 .  norethindrone (MICRONOR) 0.35 MG tablet, Take 1 tablet by mouth once daily, Disp: 28 tablet, Rfl: 6 .  simvastatin (ZOCOR) 40 MG tablet, TAKE 1 TABLET BY MOUTH AT BEDTIME, Disp: 90 tablet, Rfl: 0 .  albuterol (PROVENTIL) (2.5 MG/3ML) 0.083% nebulizer solution, Nebulize 1 vial q 4-6 hour prn (Patient not taking: Reported on 02/28/2020), Disp: 75 mL, Rfl: 3    Review of Systems  Per HPI unless specifically indicated above     Objective:    There were no vitals taken for this visit.  Wt Readings from Last 3 Encounters:  04/17/20 245 lb (111.1 kg)  04/03/20 245 lb (111.1 kg)  03/13/20 242 lb (109.8 kg)    Physical Exam Constitutional:      General: She is not in acute distress.    Appearance: She is obese. She is not toxic-appearing.  HENT:     Head: Normocephalic and atraumatic.  Pulmonary:     Effort: Pulmonary  effort is normal. No respiratory distress.  Neurological:     Mental Status: She is alert and oriented to person, place, and time.  Psychiatric:        Attention and Perception: Attention normal.        Speech: Speech normal.        Behavior: Behavior is cooperative.              Assessment & Plan:    Encounter Diagnoses  Name Primary?  Marland Kitchen Anxiety Yes  . Hyperlipidemia, unspecified hyperlipidemia type   . Obesity, unspecified classification, unspecified obesity type, unspecified whether serious comorbidity present   . Right hip pain      -Pt to get labs drawn.  She will be called with results -pt to continue current medications -pt to follow up in 3 months.  She is to contact office sooner prn

## 2020-05-05 ENCOUNTER — Other Ambulatory Visit: Payer: Self-pay | Admitting: Obstetrics and Gynecology

## 2020-05-15 ENCOUNTER — Other Ambulatory Visit: Payer: Self-pay

## 2020-05-15 ENCOUNTER — Encounter: Payer: Self-pay | Admitting: Orthopaedic Surgery

## 2020-05-15 ENCOUNTER — Ambulatory Visit (INDEPENDENT_AMBULATORY_CARE_PROVIDER_SITE_OTHER): Payer: Self-pay | Admitting: Orthopaedic Surgery

## 2020-05-15 VITALS — BP 137/80 | HR 76 | Ht 66.0 in | Wt 246.0 lb

## 2020-05-15 DIAGNOSIS — M7061 Trochanteric bursitis, right hip: Secondary | ICD-10-CM

## 2020-05-15 MED ORDER — HYDROCODONE-ACETAMINOPHEN 5-325 MG PO TABS: ORAL_TABLET | ORAL | 0 refills | Status: DC

## 2020-05-15 NOTE — Progress Notes (Signed)
PROCEDURE NOTE:  The patient request injection, verbal consent was obtained.  The right trochanteric area of the hip was prepped appropriately after time out was performed.   Sterile technique was observed and injection of 1 cc of Depo-Medrol 40 mg with several cc's of plain xylocaine. Anesthesia was provided by ethyl chloride and a 20-gauge needle was used to inject the hip area. The injection was tolerated well.  A band aid dressing was applied.  The patient was advised to apply ice later today and tomorrow to the injection sight as needed.  I have reviewed the [place] Controlled Substance Reporting System web site prior to prescribing narcotic medicine for this patient.   Return in one month.  Electronically Signed Darreld Mclean, MD 8/3/202110:07 AM

## 2020-06-05 ENCOUNTER — Other Ambulatory Visit (HOSPITAL_COMMUNITY)
Admission: RE | Admit: 2020-06-05 | Discharge: 2020-06-05 | Disposition: A | Discharge status: Home / Self Care | Payer: Self-pay | Source: Ambulatory Visit | Attending: Physician Assistant | Admitting: Physician Assistant

## 2020-06-05 DIAGNOSIS — E785 Hyperlipidemia, unspecified: Secondary | ICD-10-CM | POA: Insufficient documentation

## 2020-06-05 LAB — COMPREHENSIVE METABOLIC PANEL
ALT: 53 U/L — ABNORMAL HIGH (ref 0–44)
AST: 34 U/L (ref 15–41)
Albumin: 4.2 g/dL (ref 3.5–5.0)
Alkaline Phosphatase: 28 U/L — ABNORMAL LOW (ref 38–126)
Anion gap: 9 (ref 5–15)
BUN: 13 mg/dL (ref 6–20)
CO2: 20 mmol/L — ABNORMAL LOW (ref 22–32)
Calcium: 9 mg/dL (ref 8.9–10.3)
Chloride: 109 mmol/L (ref 98–111)
Creatinine, Ser: 0.98 mg/dL (ref 0.44–1.00)
GFR calc Af Amer: >60 mL/min (ref >60)
GFR calc non Af Amer: >60 mL/min (ref >60)
Glucose, Bld: 90 mg/dL (ref 70–99)
Potassium: 3.6 mmol/L (ref 3.5–5.1)
Sodium: 138 mmol/L (ref 135–145)
Total Bilirubin: 0.5 mg/dL (ref 0.3–1.2)
Total Protein: 7.2 g/dL (ref 6.5–8.1)

## 2020-06-05 LAB — LIPID PANEL
Cholesterol: 140 mg/dL (ref 0–200)
HDL: 30 mg/dL — ABNORMAL LOW (ref >40)
LDL Cholesterol: 86 mg/dL (ref 0–99)
Total CHOL/HDL Ratio: 4.7 RATIO
Triglycerides: 120 mg/dL (ref <150)
VLDL: 24 mg/dL (ref 0–40)

## 2020-06-09 ENCOUNTER — Other Ambulatory Visit: Payer: Self-pay | Admitting: Obstetrics and Gynecology

## 2020-06-10 NOTE — Telephone Encounter (Signed)
refil Jencycla x 4 refils.

## 2020-06-13 ENCOUNTER — Encounter: Payer: Self-pay | Admitting: Physician Assistant

## 2020-06-13 ENCOUNTER — Ambulatory Visit: Payer: Self-pay | Admitting: Physician Assistant

## 2020-06-13 ENCOUNTER — Other Ambulatory Visit: Payer: Self-pay

## 2020-06-13 VITALS — Temp 96.9°F

## 2020-06-13 DIAGNOSIS — R21 Rash and other nonspecific skin eruption: Secondary | ICD-10-CM

## 2020-06-13 MED ORDER — CLOTRIMAZOLE-BETAMETHASONE 1-0.05 % EX CREA: 1.0000 "application " | TOPICAL_CREAM | Freq: Two times a day (BID) | CUTANEOUS | 0 refills | Status: DC

## 2020-06-13 NOTE — Progress Notes (Signed)
   There were no vitals taken for this visit.   Subjective:    Patient ID: [name], female    DOB: 08/31/1981, 39 y.o.   MRN: [id number]  HPI: [name] is a 39 y.o. female presenting on 06/13/2020 for No chief complaint on file.   HPI   Pt had a negative covid 19 screening questionnaire.     Rash x 3 wk on forehead and bridge of nose  She has Tried neosporin alcohol clorox and her husands steroid cream (3 doses)  No known exposures      Relevant past medical, surgical, family and social history reviewed and updated as indicated. Interim medical history since our last visit reviewed. Allergies and medications reviewed and updated.  Review of Systems  Per HPI unless specifically indicated above     Objective:    There were no vitals taken for this visit.   Vitals:   06/13/20 1352  Temp: (!) 96.9 F (36.1 C)     Wt Readings from Last 3 Encounters:  05/15/20 246 lb (111.6 kg)  04/17/20 245 lb (111.1 kg)  04/03/20 245 lb (111.1 kg)    Physical Exam Constitutional:      General: She is not in acute distress.    Appearance: She is not ill-appearing.  HENT:     Head: Normocephalic and atraumatic.  Pulmonary:     Effort: No respiratory distress.  Neurological:     Mental Status: She is alert and oriented to person, place, and time.  Psychiatric:        Attention and Perception: Attention normal.        Speech: Speech normal.        Behavior: Behavior normal. Behavior is cooperative.     Rash forehead and bridge of nose.  Papules.  No discrete lesions.  No rash seen elsewhere.      Assessment & Plan:   Encounter Diagnosis  Name Primary?  . Rash Yes     -rx lotrisone. Stop using other things on it -Counseled covid vaccination -Follow up as scheduled.  Contact office sooner if rash persists or other problem

## 2020-06-14 ENCOUNTER — Other Ambulatory Visit: Payer: Self-pay

## 2020-06-14 ENCOUNTER — Encounter: Payer: Self-pay | Admitting: Orthopaedic Surgery

## 2020-06-14 ENCOUNTER — Ambulatory Visit (INDEPENDENT_AMBULATORY_CARE_PROVIDER_SITE_OTHER): Payer: Self-pay | Admitting: Orthopaedic Surgery

## 2020-06-14 DIAGNOSIS — M7061 Trochanteric bursitis, right hip: Secondary | ICD-10-CM

## 2020-06-14 MED ORDER — HYDROCODONE-ACETAMINOPHEN 5-325 MG PO TABS: ORAL_TABLET | ORAL | 0 refills | Status: DC

## 2020-06-14 NOTE — Progress Notes (Signed)
PROCEDURE NOTE:  The patient request injection, verbal consent was obtained.  The right trochanteric area of the hip was prepped appropriately after time out was performed.   Sterile technique was observed and injection of 1 cc of Depo-Medrol 40 mg with several cc's of plain xylocaine. Anesthesia was provided by ethyl chloride and a 20-gauge needle was used to inject the hip area. The injection was tolerated well.  A band aid dressing was applied.  The patient was advised to apply ice later today and tomorrow to the injection sight as needed.  I have reviewed the [place] Controlled Substance Reporting System web site prior to prescribing narcotic medicine for this patient.   Electronically Signed Darreld Mclean, MD 9/2/202110:28 AM

## 2020-06-19 ENCOUNTER — Other Ambulatory Visit: Payer: Self-pay | Admitting: Obstetrics and Gynecology

## 2020-06-21 NOTE — Telephone Encounter (Signed)
Refilled Megace x 2

## 2020-07-09 ENCOUNTER — Encounter: Payer: Self-pay | Admitting: Physician Assistant

## 2020-07-09 ENCOUNTER — Ambulatory Visit: Payer: Self-pay | Admitting: Physician Assistant

## 2020-07-09 ENCOUNTER — Other Ambulatory Visit: Payer: Self-pay

## 2020-07-09 DIAGNOSIS — R0981 Nasal congestion: Secondary | ICD-10-CM

## 2020-07-09 DIAGNOSIS — R059 Cough, unspecified: Secondary | ICD-10-CM

## 2020-07-09 DIAGNOSIS — Z20822 Contact with and (suspected) exposure to covid-19: Secondary | ICD-10-CM

## 2020-07-09 DIAGNOSIS — R519 Headache, unspecified: Secondary | ICD-10-CM

## 2020-07-09 NOTE — Progress Notes (Signed)
There were no vitals taken for this visit.   Subjective:    Patient ID: [name], female    DOB: 1981/07/24, 39 y.o.   MRN: [id number]  HPI: [name] is a 39 y.o. female presenting on 07/09/2020 for No chief complaint on file.   HPI   This is a telemedicine appointment through [employer] due to coronavirus pandemic.  I connected with  Preston A Hebard on 07/09/20 by a video enabled telemedicine application and verified that I am speaking with the correct person using two identifiers.   I discussed the limitations of evaluation and management by telemedicine. The patient expressed understanding and agreed to proceed.  Pt is at home. Provider is at office.    Pt is a 39yoF who reports that she is Not feeling well. She started feeling bad Friday. She says that inside of her nostrils hurt.   She also has some ST.   She says her Nose runs in the morning and at bedtime.  she has A little coughing.  Her head hurts.    She says she Cried due to HA over the weekend.    her Only self treatment is putting neosporin in her nose.    Pt quit smoking approximately February this year.  Pt is not vaccinated for covid.         Relevant past medical, surgical, family and social history reviewed and updated as indicated. Interim medical history since our last visit reviewed. Allergies and medications reviewed and updated.   Current Outpatient Medications:  .  citalopram (CELEXA) 40 MG tablet, Take 1 tablet by mouth once daily, Disp: 30 tablet, Rfl: 6 .  fenofibrate (TRICOR) 145 MG tablet, Take 1 tablet (145 mg total) by mouth daily., Disp: 30 tablet, Rfl: 6 .  IRON PO, Take 1 tablet by mouth daily., Disp: , Rfl:  .  JENCYCLA 0.35 MG tablet, Take 1 tablet by mouth once daily, Disp: 84 tablet, Rfl: 3 .  megestrol (MEGACE) 40 MG tablet, TAKE 1 TABLET BY MOUTH THREE TIMES DAILY UNTIL BLEEDING STOPS, THEN TAKE 1 TABLET ONCE DAILY UNTIL FOLLOW UP, Disp: 45 tablet, Rfl: 1 .  simvastatin (ZOCOR)  40 MG tablet, TAKE 1 TABLET BY MOUTH AT BEDTIME, Disp: 90 tablet, Rfl: 0 .  HYDROcodone-acetaminophen (NORCO/VICODIN) 5-325 MG tablet, One tablet every six hours for pain.  Limit 7 days., Disp: 28 tablet, Rfl: 0 .  naproxen (NAPROSYN) 500 MG tablet, Take 1 tablet (500 mg total) by mouth 2 (two) times daily with a meal. (Patient not taking: Reported on 07/09/2020), Disp: 60 tablet, Rfl: 5     Review of Systems  Per HPI unless specifically indicated above     Objective:    There were no vitals taken for this visit.  Wt Readings from Last 3 Encounters:  05/15/20 246 lb (111.6 kg)  04/17/20 245 lb (111.1 kg)  04/03/20 245 lb (111.1 kg)    Physical Exam Constitutional:      General: She is not in acute distress.    Appearance: She is obese. She is ill-appearing. She is not toxic-appearing.     Comments: Overall, pt just looks like she doesn't feel well.  HENT:     Head: Normocephalic and atraumatic.  Pulmonary:     Effort: No respiratory distress.  Neurological:     Mental Status: She is alert and oriented to person, place, and time.  Psychiatric:        Attention and Perception: Attention normal.  Speech: Speech normal.        Behavior: Behavior is cooperative.            Assessment & Plan:    Encounter Diagnoses  Name Primary?  . Person under investigation for COVID-19 Yes  . Nonintractable headache, unspecified chronicity pattern, unspecified headache type   . Nasal congestion   . Cough      -Pt is scheduled for covid test today. -She is recommended to stop using neosporin in her nose.   She is told that she can use saline nasal spray. -Additionally, she can try some OTC sudafed and/or antihistamine like benedryl or claritin.

## 2020-07-11 ENCOUNTER — Telehealth: Payer: Self-pay | Admitting: Student

## 2020-07-11 LAB — SPECIMEN STATUS REPORT

## 2020-07-11 LAB — NOVEL CORONAVIRUS, NAA: SARS-CoV-2, NAA: NOT DETECTED

## 2020-07-11 LAB — SARS-COV-2, NAA 2 DAY TAT

## 2020-07-11 NOTE — Telephone Encounter (Signed)
Pt called and was notified of negative COVID results. Pt verbalized understanding and all questions were answered.

## 2020-07-17 ENCOUNTER — Encounter: Payer: Self-pay | Admitting: Orthopaedic Surgery

## 2020-07-17 ENCOUNTER — Ambulatory Visit (INDEPENDENT_AMBULATORY_CARE_PROVIDER_SITE_OTHER): Payer: Self-pay | Admitting: Orthopaedic Surgery

## 2020-07-17 ENCOUNTER — Other Ambulatory Visit: Payer: Self-pay

## 2020-07-17 DIAGNOSIS — M7061 Trochanteric bursitis, right hip: Secondary | ICD-10-CM

## 2020-07-17 MED ORDER — HYDROCODONE-ACETAMINOPHEN 5-325 MG PO TABS
ORAL_TABLET | ORAL | 0 refills | Status: DC
Start: 2020-07-17 — End: 2020-10-17

## 2020-07-17 NOTE — Progress Notes (Signed)
PROCEDURE NOTE:  The patient request injection, verbal consent was obtained.  The right trochanteric area of the hip was prepped appropriately after time out was performed.   Sterile technique was observed and injection of 1 cc of Depo-Medrol 40 mg with several cc's of plain xylocaine. Anesthesia was provided by ethyl chloride and a 20-gauge needle was used to inject the hip area. The injection was tolerated well.  A band aid dressing was applied.  The patient was advised to apply ice later today and tomorrow to the injection sight as needed.  I have reviewed the [place] Controlled Substance Reporting System web site prior to prescribing narcotic medicine for this patient.   Return in one month  Call if any problem.  Precautions discussed.   Electronically Signed Darreld Mclean, MD 10/5/20218:41 AM

## 2020-07-20 ENCOUNTER — Other Ambulatory Visit: Payer: Self-pay | Admitting: Physician Assistant

## 2020-07-20 ENCOUNTER — Ambulatory Visit (INDEPENDENT_AMBULATORY_CARE_PROVIDER_SITE_OTHER): Payer: Self-pay | Admitting: Family Medicine

## 2020-07-20 ENCOUNTER — Other Ambulatory Visit: Payer: Self-pay

## 2020-07-20 ENCOUNTER — Encounter: Payer: Self-pay | Admitting: Family Medicine

## 2020-07-20 DIAGNOSIS — M25551 Pain in right hip: Secondary | ICD-10-CM

## 2020-07-20 MED ORDER — MELOXICAM 15 MG PO TABS: ORAL_TABLET | ORAL | 1 refills | Status: DC

## 2020-07-20 NOTE — Progress Notes (Signed)
SUBJECTIVE:   CHIEF COMPLAINT / HPI:   Right Hip/Buttock Pain [name] is a very pleasant 39 year old female who presents today for right hip/right buttock pain.  She was seen a orthopedist for this issue and receiving injections however she comes today for evaluation and she states the injections have not been helping.  This has been an ongoing issue for the past year with no inciting event and no trauma that she can think of that would have caused this pain to occur.  It is worse waking her from sleep and it does hurt her to lay on her right side at night.  The pain is more posterior to the hip and more more in the buttock but does radiate up to the lateral side of the hip.  She has no groin pain.  She has tried over-the-counter creams and sprays and naproxen which also have not provided any relief.  It does get worse with activity such as walking standing for long periods of time.  She does have some associated intermittent numbness and tingling that follows the same path of her pain.  Nothing coming from the lower back and she has no numbness or tingling extending from the low back down through the buttock down to the back of the leg.  She does feel like the right leg is weaker than the left.  Overall her pain is intermittent and associated with activity and not getting any better.  Patient has had x-rays of the right hip which I did review those images today and agree that they showed no signs of osteoarthritis or any acute osseous abnormality.  PERTINENT  PMH / PSH: Anxiety, obesity  OBJECTIVE:   BP 128/85   Ht 5\' 6"  (1.676 m)   Wt 235 lb (106.6 kg)   BMI 37.93 kg/m   [facility] 07/20/2020  Frequency of aerobic exercise (# of days/week) 0  Average time in minutes 0  Frequency of strengthening activities (# of days/week) 0   Hip, Right: No obvious rash, erythema, ecchymosis, or edema. ROM full in all directions but with discomfort; Strength 5/5 in  IR/ER/Flex/Ext/Abd/Add however she is weaker in hip abduction and on knee extension when compared to the left side. Pelvic alignment unremarkable to inspection and palpation.. Greater trochanter without tenderness to palpation. Positive for tenderness over piriformis and ischial tuberosity. No SI joint tenderness and normal minimal SI movement. Special Test:    - FABER/FADIR test: NEG   - Passive Log Roll test: NEG   - Negative Ober's test   - Negative straight leg raise test  Limited MS[facility] U/S Ultrasound of the right ischial tuberosity was significant for mild swelling and edema of the soft tissues that inserted at the ischial tuberosity.  Collection of hypoechoic fluid surrounding the ischial tuberosity.  Muscle fiber tendon did not show any signs of disruption or tearing or areas of hypoechoic density. Interpretation: Muscle/tendon inflammation at the insertion of the ischial tuberosity with evidence of bursitis.  ASSESSMENT/PLAN:   Right hip pain No tenderness over the greater tuberosity today on the right side.  Most of her pain seems to be coming from the ischial tuberosity today as it is painful to palpation and she has associated muscle weakness with attachments coming from the ischial tuberosity most notably in hip abduction and with testing of the hamstrings. -Can continue conservative management such as rest, ice, heat as needed -Stop naproxen and start meloxicam 15 mg once daily with food  for 2 weeks scheduled then as needed -Patient given hip abductor and hamstring exercises to do with emphasis on posterior leg raise with hip extension to be done once daily for 5 days/week.  If she tolerates these exercises once daily I told her to increase in the twice daily. -Follow-up in 4 weeks     Arlyce Harman, DO PGY-4, Sports Medicine Fellow [facility]

## 2020-07-20 NOTE — Patient Instructions (Signed)
It was great to meet you today! Thank you for letting me participate in your care!  Today, we discussed the pain you've been having for a long time and I believe it is coming from muscle and tendon inflammation that connect at your ischial tuberosity. I have given you a prescription medication to take daily with food for two weeks, then as needed. The most important thing to do will be the exercises. I will see you back in 4 weeks.  Be well, Jules Schick, DO PGY-4, Sports Medicine Fellow [facility]

## 2020-07-20 NOTE — Assessment & Plan Note (Signed)
No tenderness over the greater tuberosity today on the right side.  Most of her pain seems to be coming from the ischial tuberosity today as it is painful to palpation and she has associated muscle weakness with attachments coming from the ischial tuberosity most notably in hip abduction and with testing of the hamstrings. -Can continue conservative management such as rest, ice, heat as needed -Stop naproxen and start meloxicam 15 mg once daily with food for 2 weeks scheduled then as needed -Patient given hip abductor and hamstring exercises to do with emphasis on posterior leg raise with hip extension to be done once daily for 5 days/week.  If she tolerates these exercises once daily I told her to increase in the twice daily. -Follow-up in 4 weeks

## 2020-07-20 NOTE — Progress Notes (Signed)
[facility]: Attending Note: I have reviewed the chart, discussed wit the Sports Medicine Fellow. I agree with assessment and treatment plan as detailed in the Fellow's note. Right buttock area pain that is tender to palpation over the ischial tuberosity area.  On my exam today she had no tenderness over the greater trochanteric bursa area.  Her gait is somewhat antalgic.  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  She does lift a 32-year-old on the right side.  We did an ultrasound but did not charge for that today, however the ultrasound did show findings consistent with ischial bursitis.  Reviewing her past medical history, she has had what looks like an injection every month since April for greater trochanteric bursitis.  She said none of those injections helped her.  I suspect she has some type of underlying gait abnormality or functional muscle imbalance that is predisposing her to this but it is difficult to see that on her current exam because she has such an antalgic gait.  We discussed at length.  I would do 2 weeks of oral NSAIDs only.  I do not think an injection is useful at this time.  I explained to her I think it will take probably 6-8 or 12 weeks for this to resolve.  A home exercise program that needs to be aggressive.  Focusing on hip extension posteriorly in the home exercise program.  Follow-up 4 to 6 weeks.  Answered all questions.

## 2020-08-01 ENCOUNTER — Other Ambulatory Visit: Payer: Self-pay

## 2020-08-01 DIAGNOSIS — Z20822 Contact with and (suspected) exposure to covid-19: Secondary | ICD-10-CM

## 2020-08-02 ENCOUNTER — Ambulatory Visit: Payer: Self-pay | Admitting: Physician Assistant

## 2020-08-02 DIAGNOSIS — Z130 Encounter for screening for diseases of the blood and blood-forming organs and certain disorders involving the immune mechanism: Secondary | ICD-10-CM

## 2020-08-02 DIAGNOSIS — Z862 Personal history of diseases of the blood and blood-forming organs and certain disorders involving the immune mechanism: Secondary | ICD-10-CM

## 2020-08-02 DIAGNOSIS — F419 Anxiety disorder, unspecified: Secondary | ICD-10-CM

## 2020-08-02 DIAGNOSIS — R059 Cough, unspecified: Secondary | ICD-10-CM

## 2020-08-02 DIAGNOSIS — J029 Acute pharyngitis, unspecified: Secondary | ICD-10-CM

## 2020-08-02 DIAGNOSIS — E785 Hyperlipidemia, unspecified: Secondary | ICD-10-CM

## 2020-08-02 DIAGNOSIS — Z20822 Contact with and (suspected) exposure to covid-19: Secondary | ICD-10-CM

## 2020-08-02 LAB — NOVEL CORONAVIRUS, NAA: SARS-CoV-2, NAA: NOT DETECTED

## 2020-08-02 LAB — SARS-COV-2, NAA 2 DAY TAT

## 2020-08-02 LAB — SPECIMEN STATUS REPORT

## 2020-08-02 NOTE — Progress Notes (Signed)
There were no vitals taken for this visit.   Subjective:    Patient ID: [name], female    DOB: 1981-06-16, 39 y.o.   MRN: [id number]  HPI: MYRACLE FEBRES is a 39 y.o. female presenting on 08/02/2020 for No chief complaint on file.   HPI    This is a telemedicine appointment through [employer] due to coronavirus pandemic.  I connected with  Cloie A Dupee on 08/02/20 by a video enabled telemedicine application and verified that I am speaking with the correct person using two identifiers.   I discussed the limitations of evaluation and management by telemedicine. The patient expressed understanding and agreed to proceed.  Pt is at home.  Provider is at office.    Pt sick since Monday.  Got worse yesterday.  All 3 of her kids are sick. (2 in school, 1 is too young for school)  Her husband isn't sick yet.    She has not gotten covid vaccination and is not wanting covid vaccination at this time.   Se reports ST, cough, body aches mostly in her chest and ribs area, no N/V.  Some diarhea.  No EA.  No fever.   Her Mood/anxiety is okay.  No tearful episodes.         Relevant past medical, surgical, family and social history reviewed and updated as indicated. Interim medical history since our last visit reviewed. Allergies and medications reviewed and updated.   Current Outpatient Medications:    citalopram (CELEXA) 40 MG tablet, Take 1 tablet by mouth once daily, Disp: 30 tablet, Rfl: 6   fenofibrate (TRICOR) 145 MG tablet, Take 1 tablet (145 mg total) by mouth daily., Disp: 30 tablet, Rfl: 6   IRON PO, Take 1 tablet by mouth daily., Disp: , Rfl:    JENCYCLA 0.35 MG tablet, Take 1 tablet by mouth once daily, Disp: 84 tablet, Rfl: 3   megestrol (MEGACE) 40 MG tablet, TAKE 1 TABLET BY MOUTH THREE TIMES DAILY UNTIL BLEEDING STOPS, THEN TAKE 1 TABLET ONCE DAILY UNTIL FOLLOW UP, Disp: 45 tablet, Rfl: 1   meloxicam (MOBIC) 15 MG tablet, Take 1 tab with food for 2 weeks.  Then take as needed., Disp: 30 tablet, Rfl: 1   simvastatin (ZOCOR) 40 MG tablet, TAKE 1 TABLET BY MOUTH AT BEDTIME, Disp: 30 tablet, Rfl: 3   HYDROcodone-acetaminophen (NORCO/VICODIN) 5-325 MG tablet, One tablet every six hours for pain.  Limit 7 days. (Patient not taking: Reported on 08/02/2020), Disp: 28 tablet, Rfl: 0     Review of Systems  Per HPI unless specifically indicated above     Objective:    There were no vitals taken for this visit.  Wt Readings from Last 3 Encounters:  07/20/20 235 lb (106.6 kg)  05/15/20 246 lb (111.6 kg)  04/17/20 245 lb (111.1 kg)    Physical Exam Constitutional:      General: She is not in acute distress.    Appearance: She is obese. She is not toxic-appearing.  HENT:     Head: Normocephalic and atraumatic.  Pulmonary:     Effort: No respiratory distress.  Neurological:     Mental Status: She is alert and oriented to person, place, and time.  Psychiatric:        Attention and Perception: Attention normal.        Mood and Affect: Mood normal.        Speech: Speech normal.        Behavior: Behavior  normal. Behavior is cooperative.            Assessment & Plan:    Encounter Diagnoses  Name Primary?   Person under investigation for COVID-19 Yes   Cough    Sore throat    Anxiety      -covid test was ordered yesterday when she was scheduled for virtual appointment.  covid test results pending.  Pt reminded to self- isolate until results.  She has mychart so she check check for her results  -will rescheule appt on the books for monday (for mood and lipids)  Discussed covid vaccination but she still Doesn't want vaccination  Will check labs/H/h, lipids before next appt

## 2020-08-06 ENCOUNTER — Ambulatory Visit: Payer: Self-pay | Admitting: Physician Assistant

## 2020-08-14 ENCOUNTER — Ambulatory Visit: Payer: Self-pay | Admitting: Orthopaedic Surgery

## 2020-08-17 ENCOUNTER — Ambulatory Visit (INDEPENDENT_AMBULATORY_CARE_PROVIDER_SITE_OTHER): Payer: Self-pay | Admitting: Family Medicine

## 2020-08-17 ENCOUNTER — Other Ambulatory Visit: Payer: Self-pay

## 2020-08-17 ENCOUNTER — Encounter: Payer: Self-pay | Admitting: Family Medicine

## 2020-08-17 VITALS — BP 121/71 | Ht 66.0 in | Wt 235.0 lb

## 2020-08-17 DIAGNOSIS — M76891 Other specified enthesopathies of right lower limb, excluding foot: Secondary | ICD-10-CM

## 2020-08-17 DIAGNOSIS — M545 Low back pain, unspecified: Secondary | ICD-10-CM

## 2020-08-17 NOTE — Progress Notes (Signed)
   PCP: Jacquelin Hawking, PA-C  Subjective:   HPI: Patient is a 39 y.o. female here for follow-up on glutes/hip pain.  Patient was seen on 07/20/2020, at that time was diagnosed with hamstring tendinopathy at its insertion on the ischial tuberosity.  She was started on meloxicam, along with hip abductor and hamstring exercises.  Today, patient reports significant improvement in her pain.  She has almost no pain and has been diligent with her exercises.  She she does note that she has had some new pain in her right low back.  This was not present before, but came on slowly since last visit.  She has been very active picking up her son and has been doing more things due to the decrease in her gluteal pain.  She denies any sudden injury or trauma.  No numbness or tingling.  No radicular symptoms.   Review of Systems:  Per HPI.   PMFSH, medications and smoking status reviewed.      Objective:  Physical Exam:  [facility] 07/20/2020 08/17/2020  Frequency of aerobic exercise (# of days/week) 0 0  Average time in minutes 0 0  Frequency of strengthening activities (# of days/week) 0 0     Gen: awake, alert, NAD, comfortable in exam room Pulm: breathing unlabored  Right hip:  Inspection: No evidence of erythema, ecchymosis, swelling, edema  Palpation: No tenderness to palpation to greater trochanteric bursa, ASIS, AIIS. No pain with pelvic compression. ROM: Intact ROM to IR and ER to hip, equal and symmetric.  Strength: Intact to resisted hip flexion/extension. Intact to resisted leg flexion/extension.  4+/5 strength with resisted knee flexion at 90 degrees, 5/5 on contralateral side.  5/5 strength with hip abduction and equal bilaterally. Special tests: Negative FABER, negative FADIR bilaterally  Lumbar spine:  Inspection: No evidence of erythema, ecchymosis, swelling edema.  Palpation: No midline spinal tenderness. Nontender to facets.  Tenderness to the  paraspinal musculature on the right.  ROM: Intact to forward flexion, extension, rotation, and bending.  Reproduction of pain with forward flexion.    Assessment & Plan:  1.  Right insertional hamstring tendinopathy Patient with significant improvement in symptoms after starting exercises.  She she now has near resolution of her symptoms.  We will plan to continue her current exercises 2-3 times per week for maintenance.  Follow-up if recurrence.  2.  Right low back pain Patient with new low back pain, suspect this to be due to compensation from above along with sudden increase in activity.  We discussed the importance of proper lifting technique when playing with her son.  Also given low back exercises for strengthening and stretching.  Follow-up as needed if not improving with these conservative measures.  Guy Sandifer, MD [facility] 08/17/2020 10:08 AM

## 2020-08-17 NOTE — Progress Notes (Signed)
[facility]: Attending Note: I have reviewed the chart, discussed wit the Sports Medicine Fellow. I agree with assessment and treatment plan as detailed in the Fellow's note. Significant improvement in her right hip pain. She does note now some lower back discomfort. I suspect she has been walking with compensatory muscle involvement when she had hip pain. I would continue the hip exercises for another 2 to 4 weeks and add low back exercises which were given in handout form today. She seems to be doing really well and can follow-up as needed. She is quite pleased.

## 2020-08-28 ENCOUNTER — Other Ambulatory Visit (HOSPITAL_COMMUNITY)
Admission: RE | Admit: 2020-08-28 | Discharge: 2020-08-28 | Disposition: A | Discharge status: Home / Self Care | Payer: Self-pay | Source: Ambulatory Visit | Attending: Physician Assistant | Admitting: Physician Assistant

## 2020-08-28 ENCOUNTER — Other Ambulatory Visit: Payer: Self-pay

## 2020-08-28 DIAGNOSIS — E785 Hyperlipidemia, unspecified: Secondary | ICD-10-CM

## 2020-08-28 DIAGNOSIS — Z862 Personal history of diseases of the blood and blood-forming organs and certain disorders involving the immune mechanism: Secondary | ICD-10-CM

## 2020-08-28 DIAGNOSIS — Z130 Encounter for screening for diseases of the blood and blood-forming organs and certain disorders involving the immune mechanism: Secondary | ICD-10-CM

## 2020-08-28 LAB — COMPREHENSIVE METABOLIC PANEL
ALT: 54 U/L — ABNORMAL HIGH (ref 0–44)
AST: 35 U/L (ref 15–41)
Albumin: 4.3 g/dL (ref 3.5–5.0)
Alkaline Phosphatase: 33 U/L — ABNORMAL LOW (ref 38–126)
Anion gap: 8 (ref 5–15)
BUN: 21 mg/dL — ABNORMAL HIGH (ref 6–20)
CO2: 21 mmol/L — ABNORMAL LOW (ref 22–32)
Calcium: 9.5 mg/dL (ref 8.9–10.3)
Chloride: 109 mmol/L (ref 98–111)
Creatinine, Ser: 1.07 mg/dL — ABNORMAL HIGH (ref 0.44–1.00)
GFR, Estimated: >60 mL/min (ref >60)
Glucose, Bld: 108 mg/dL — ABNORMAL HIGH (ref 70–99)
Potassium: 3.9 mmol/L (ref 3.5–5.1)
Sodium: 138 mmol/L (ref 135–145)
Total Bilirubin: 0.5 mg/dL (ref 0.3–1.2)
Total Protein: 7.1 g/dL (ref 6.5–8.1)

## 2020-08-28 LAB — LIPID PANEL
Cholesterol: 160 mg/dL (ref 0–200)
HDL: 29 mg/dL — ABNORMAL LOW (ref >40)
LDL Cholesterol: 101 mg/dL — ABNORMAL HIGH (ref 0–99)
Total CHOL/HDL Ratio: 5.5 RATIO
Triglycerides: 151 mg/dL — ABNORMAL HIGH (ref <150)
VLDL: 30 mg/dL (ref 0–40)

## 2020-08-28 LAB — HEMOGLOBIN AND HEMATOCRIT, BLOOD
HCT: 40.4 % (ref 36.0–46.0)
Hemoglobin: 13.3 g/dL (ref 12.0–15.0)

## 2020-08-30 ENCOUNTER — Other Ambulatory Visit: Payer: Self-pay | Admitting: Physician Assistant

## 2020-09-12 ENCOUNTER — Ambulatory Visit: Payer: Self-pay | Admitting: Physician Assistant

## 2020-09-13 ENCOUNTER — Ambulatory Visit: Payer: Self-pay | Admitting: Physician Assistant

## 2020-09-13 ENCOUNTER — Encounter: Payer: Self-pay | Admitting: Physician Assistant

## 2020-09-13 VITALS — BP 130/78 | HR 72 | Temp 97.5°F | Ht 66.0 in | Wt 258.9 lb

## 2020-09-13 DIAGNOSIS — E785 Hyperlipidemia, unspecified: Secondary | ICD-10-CM

## 2020-09-13 DIAGNOSIS — F419 Anxiety disorder, unspecified: Secondary | ICD-10-CM

## 2020-09-13 NOTE — Progress Notes (Signed)
BP 130/78   Pulse 72   Temp (!) 97.5 F (36.4 C)   Ht 5\' 6"  (1.676 m)   Wt 258 lb 14.4 oz (117.4 kg)   SpO2 97%   BMI 41.79 kg/m    Subjective:    Patient ID: , female    DOB: 09-Apr-1981, 39 y.o.   MRN: [id number]  HPI: [name] is a 39 y.o. female presenting on 09/13/2020 for Mental Health Problem and Hyperlipidemia   HPI      Pt had a negative covid 19 screening questionnaire.    Chief Complaint  Patient presents with  . Mental Health Problem  . Hyperlipidemia     Pt says her Mood is okay.  She says she is okay.  She is having to return to court for custody issues of the 40 year old that has been with her family for some time.    She is still continuing to not smoke.  She says it is still hard, particularly with her stress.    She says she has no other concerns.    Relevant past medical, surgical, family and social history reviewed and updated as indicated. Interim medical history since our last visit reviewed. Allergies and medications reviewed and updated.   Current Outpatient Medications:  .  citalopram (CELEXA) 40 MG tablet, Take 1 tablet by mouth once daily, Disp: 30 tablet, Rfl: 1 .  fenofibrate (TRICOR) 145 MG tablet, Take 1 tablet (145 mg total) by mouth daily., Disp: 30 tablet, Rfl: 6 .  HYDROcodone-acetaminophen (NORCO/VICODIN) 5-325 MG tablet, One tablet every six hours for pain.  Limit 7 days., Disp: 28 tablet, Rfl: 0 .  IRON PO, Take 1 tablet by mouth daily., Disp: , Rfl:  .  JENCYCLA 0.35 MG tablet, Take 1 tablet by mouth once daily, Disp: 84 tablet, Rfl: 3 .  megestrol (MEGACE) 40 MG tablet, TAKE 1 TABLET BY MOUTH THREE TIMES DAILY UNTIL BLEEDING STOPS, THEN TAKE 1 TABLET ONCE DAILY UNTIL FOLLOW UP, Disp: 45 tablet, Rfl: 1 .  meloxicam (MOBIC) 15 MG tablet, Take 1 tab with food for 2 weeks. Then take as needed., Disp: 30 tablet, Rfl: 1 .  simvastatin (ZOCOR) 40 MG tablet, TAKE 1 TABLET BY MOUTH AT BEDTIME, Disp: 30 tablet, Rfl:  3     Review of Systems  Per HPI unless specifically indicated above     Objective:    BP 130/78   Pulse 72   Temp (!) 97.5 F (36.4 C)   Ht 5\' 6"  (1.676 m)   Wt 258 lb 14.4 oz (117.4 kg)   SpO2 97%   BMI 41.79 kg/m   Wt Readings from Last 3 Encounters:  09/13/20 258 lb 14.4 oz (117.4 kg)  08/17/20 235 lb (106.6 kg)  07/20/20 235 lb (106.6 kg)    Physical Exam Vitals reviewed.  Constitutional:      General: She is not in acute distress.    Appearance: She is well-developed. She is obese. She is not ill-appearing.  HENT:     Head: Normocephalic and atraumatic.  Cardiovascular:     Rate and Rhythm: Normal rate and regular rhythm.  Pulmonary:     Effort: Pulmonary effort is normal.     Breath sounds: Normal breath sounds.  Abdominal:     General: Bowel sounds are normal.     Palpations: Abdomen is soft. There is no mass.     Tenderness: There is no abdominal tenderness.  Musculoskeletal:  Cervical back: Neck supple.     Right lower leg: No edema.     Left lower leg: No edema.  Lymphadenopathy:     Cervical: No cervical adenopathy.  Skin:    General: Skin is warm and dry.  Neurological:     Mental Status: She is alert and oriented to person, place, and time.  Psychiatric:        Attention and Perception: Attention normal.        Speech: Speech normal.        Behavior: Behavior normal. Behavior is cooperative.     Results for orders placed or performed during the hospital encounter of 08/28/20  Hemoglobin and hematocrit, blood  Result Value Ref Range   Hemoglobin 13.3 12.0 - 15.0 g/dL   HCT 33.0 36 - 46 %  Lipid panel  Result Value Ref Range   Cholesterol 160 0 - 200 mg/dL   Triglycerides 076 (H) <150 mg/dL   HDL 29 (L) >22 mg/dL   Total CHOL/HDL Ratio 5.5 RATIO   VLDL 30 0 - 40 mg/dL   LDL Cholesterol 633 (H) 0 - 99 mg/dL  Comprehensive metabolic panel  Result Value Ref Range   Sodium 138 135 - 145 mmol/L   Potassium 3.9 3.5 - 5.1 mmol/L    Chloride 109 98 - 111 mmol/L   CO2 21 (L) 22 - 32 mmol/L   Glucose, Bld 108 (H) 70 - 99 mg/dL   BUN 21 (H) 6 - 20 mg/dL   Creatinine, Ser 3.54 (H) 0.44 - 1.00 mg/dL   Calcium 9.5 8.9 - 56.2 mg/dL   Total Protein 7.1 6.5 - 8.1 g/dL   Albumin 4.3 3.5 - 5.0 g/dL   AST 35 15 - 41 U/L   ALT 54 (H) 0 - 44 U/L   Alkaline Phosphatase 33 (L) 38 - 126 U/L   Total Bilirubin 0.5 0.3 - 1.2 mg/dL   GFR, Estimated >56 >38 mL/min   Anion gap 8 5 - 15      Assessment & Plan:     Encounter Diagnoses  Name Primary?  . Hyperlipidemia, unspecified hyperlipidemia type Yes  . Anxiety   . Morbid obesity (HCC)      -reviewed labs with pt  -pt is encouraged to continue to stay off the cigarettes.   -discussed better options for dealing with stress (than eating) like taking a walk and pushing the baby in the stroller.  Exercise is more effective at reducing stress and is less harmful to the health than additional weight (she has put on 20 pounds over the past month) -no changes to medications -pt to follow up 3 months.  She is to contact office sooner prn

## 2020-10-09 ENCOUNTER — Other Ambulatory Visit: Payer: Self-pay | Admitting: *Deleted

## 2020-10-09 MED ORDER — MEGESTROL ACETATE 40 MG PO TABS
ORAL_TABLET | ORAL | 1 refills | Status: DC
Start: 2020-10-09 — End: 2021-01-14

## 2020-10-15 ENCOUNTER — Other Ambulatory Visit: Payer: Self-pay | Admitting: Physician Assistant

## 2020-10-15 MED ORDER — FENOFIBRATE 145 MG PO TABS
145.0000 mg | ORAL_TABLET | Freq: Every day | ORAL | 6 refills | Status: DC
Start: 2020-10-15 — End: 2021-05-01

## 2020-10-17 ENCOUNTER — Encounter: Payer: Self-pay | Admitting: Physician Assistant

## 2020-10-17 ENCOUNTER — Ambulatory Visit: Payer: Self-pay | Admitting: Physician Assistant

## 2020-10-17 DIAGNOSIS — Z20822 Contact with and (suspected) exposure to covid-19: Secondary | ICD-10-CM

## 2020-10-17 DIAGNOSIS — R52 Pain, unspecified: Secondary | ICD-10-CM

## 2020-10-17 DIAGNOSIS — R509 Fever, unspecified: Secondary | ICD-10-CM

## 2020-10-17 DIAGNOSIS — R059 Cough, unspecified: Secondary | ICD-10-CM

## 2020-10-17 MED ORDER — ALBUTEROL SULFATE HFA 108 (90 BASE) MCG/ACT IN AERS: 2.0000 | INHALATION_SPRAY | Freq: Four times a day (QID) | RESPIRATORY_TRACT | 1 refills | Status: DC | PRN

## 2020-10-17 NOTE — Progress Notes (Signed)
There were no vitals taken for this visit.   Subjective:    Patient ID: [name], female    DOB: Sep 12, 1981, 40 y.o.   MRN: [id number]  HPI: [name] is a 40 y.o. female presenting on 10/17/2020 for No chief complaint on file.   HPI  This is a telemedicine appointment through [employer] due to coronavirus pandemic.  I connected with  [name] on 10/17/20 by a video enabled telemedicine application and verified that I am speaking with the correct person using two identifiers.   I discussed the limitations of evaluation and management by telemedicine. The patient expressed understanding and agreed to proceed.  Pt is at home.  Provider is at office.    Pt is sick.  Pt started feeling bad about 5pm yesterday.   She is having fever and chills.  She has thick phelgm and a lot of coughing.  Sometimes her cough is really deep and takes her breath.  She says she feels like a truck hit her.    Pt has not gotten the covid vaccination.  Her 14yo son was diagnosed with strep on Monday.    Relevant past medical, surgical, family and social history reviewed and updated as indicated. Interim medical history since our last visit reviewed. Allergies and medications reviewed and updated.   Current Outpatient Medications:  .  citalopram (CELEXA) 40 MG tablet, Take 1 tablet by mouth once daily, Disp: 30 tablet, Rfl: 1 .  fenofibrate (TRICOR) 145 MG tablet, Take 1 tablet (145 mg total) by mouth daily., Disp: 30 tablet, Rfl: 6 .  IRON PO, Take 1 tablet by mouth daily., Disp: , Rfl:  .  JENCYCLA 0.35 MG tablet, Take 1 tablet by mouth once daily, Disp: 84 tablet, Rfl: 3 .  megestrol (MEGACE) 40 MG tablet, TAKE 1 TABLET BY MOUTH THREE TIMES DAILY UNTIL BLEEDING STOPS, THEN TAKE 1 TABLET ONCE DAILY UNTIL FOLLOW UP, Disp: 45 tablet, Rfl: 1 .  meloxicam (MOBIC) 15 MG tablet, Take 1 tab with food for 2 weeks. Then take as needed., Disp: 30 tablet, Rfl: 1 .  simvastatin (ZOCOR) 40 MG tablet,  TAKE 1 TABLET BY MOUTH AT BEDTIME, Disp: 30 tablet, Rfl: 3     Review of Systems  Per HPI unless specifically indicated above     Objective:    There were no vitals taken for this visit.  Wt Readings from Last 3 Encounters:  09/13/20 258 lb 14.4 oz (117.4 kg)  08/17/20 235 lb (106.6 kg)  07/20/20 235 lb (106.6 kg)    Physical Exam Constitutional:      General: She is not in acute distress.    Appearance: She is obese. She is ill-appearing. She is not toxic-appearing.  HENT:     Head: Normocephalic and atraumatic.  Pulmonary:     Effort: No respiratory distress.     Comments: Pt is able to speak in complete sentences without shortness of breath Neurological:     Mental Status: She is alert and oriented to person, place, and time.  Psychiatric:        Attention and Perception: Attention normal.        Speech: Speech normal.        Behavior: Behavior normal. Behavior is cooperative.             Assessment & Plan:    Encounter Diagnoses  Name Primary?  . Suspected COVID-19 virus infection Yes  . Cough   . Fever, unspecified  fever cause   . Body aches     -Pt is scheduled for covid test tomorrow morning in [place] -Pt's husband will stop by the office to pick up a sample proventil Inhaler -pt is counseled on self-isolation until she gets her test results AND until she is afebrile and feeling better -pt can use OTCs prn -rest, fluids -discussed going to ER if she has difficulty breathing  -pt to call office if needed for questions or other issues

## 2020-10-18 ENCOUNTER — Other Ambulatory Visit: Payer: Self-pay

## 2020-10-18 DIAGNOSIS — Z20822 Contact with and (suspected) exposure to covid-19: Secondary | ICD-10-CM

## 2020-10-20 ENCOUNTER — Telehealth: Payer: Self-pay | Admitting: Physician Assistant

## 2020-10-20 LAB — NOVEL CORONAVIRUS, NAA: SARS-CoV-2, NAA: DETECTED — AB

## 2020-10-20 LAB — SARS-COV-2, NAA 2 DAY TAT

## 2020-10-20 NOTE — Telephone Encounter (Signed)
Called pt and discussed positive covid test results.  Discussed self treatment and quarantine.  Encouraged her to get her family tested and she was given places and phone numbers where that can be done.  She is feeling bad still, "like hit by a truck", but no breathing difficulties.  All questions were answered.

## 2020-10-23 ENCOUNTER — Other Ambulatory Visit: Payer: Self-pay | Admitting: Physician Assistant

## 2020-10-23 MED ORDER — SIMVASTATIN 40 MG PO TABS: 40.0000 mg | ORAL_TABLET | Freq: Every day | ORAL | 6 refills | Status: DC

## 2020-11-08 ENCOUNTER — Encounter: Payer: Self-pay | Admitting: Physician Assistant

## 2020-11-08 ENCOUNTER — Ambulatory Visit: Payer: Self-pay | Admitting: Physician Assistant

## 2020-11-08 VITALS — BP 110/80 | HR 62 | Temp 98.2°F | Ht 66.0 in | Wt 258.0 lb

## 2020-11-08 DIAGNOSIS — J029 Acute pharyngitis, unspecified: Secondary | ICD-10-CM

## 2020-11-08 LAB — POCT RAPID STREP A (OFFICE): Rapid Strep A Screen: NEGATIVE

## 2020-11-08 NOTE — Progress Notes (Signed)
BP 110/80   Pulse 62   Temp 98.2 F (36.8 C)   Ht 5\' 6"  (1.676 m)   Wt 258 lb (117 kg)   SpO2 99%   BMI 41.64 kg/m    Subjective:    Patient ID: , female    DOB: Apr 07, 1981, 40 y.o.   MRN: [id number]  HPI: [name] is a 40 y.o. female presenting on 11/08/2020 for No chief complaint on file.   HPI    Pt had negative covid 19 screening questionnaire.   Pt was covid + january 6  Pt has sore throat that just started Monday.  It is Only on the left side.  Just a little cough but mostly only the ST.    She says it Hurts to drink, swallow or anything.  She has been Not smoking since 11/14/19    She has not gotten the covid vaccination.    Relevant past medical, surgical, family and social history reviewed and updated as indicated. Interim medical history since our last visit reviewed. Allergies and medications reviewed and updated.    Current Outpatient Medications:  .  albuterol (VENTOLIN HFA) 108 (90 Base) MCG/ACT inhaler, Inhale 2 puffs into the lungs every 6 (six) hours as needed for wheezing or shortness of breath., Disp: 8 g, Rfl: 1 .  citalopram (CELEXA) 40 MG tablet, Take 1 tablet by mouth once daily, Disp: 30 tablet, Rfl: 1 .  fenofibrate (TRICOR) 145 MG tablet, Take 1 tablet (145 mg total) by mouth daily., Disp: 30 tablet, Rfl: 6 .  IRON PO, Take 1 tablet by mouth daily., Disp: , Rfl:  .  JENCYCLA 0.35 MG tablet, Take 1 tablet by mouth once daily, Disp: 84 tablet, Rfl: 3 .  megestrol (MEGACE) 40 MG tablet, TAKE 1 TABLET BY MOUTH THREE TIMES DAILY UNTIL BLEEDING STOPS, THEN TAKE 1 TABLET ONCE DAILY UNTIL FOLLOW UP, Disp: 45 tablet, Rfl: 1 .  meloxicam (MOBIC) 15 MG tablet, Take 1 tab with food for 2 weeks. Then take as needed., Disp: 30 tablet, Rfl: 1 .  simvastatin (ZOCOR) 40 MG tablet, Take 1 tablet (40 mg total) by mouth at bedtime., Disp: 30 tablet, Rfl: 6    Review of Systems  Per HPI unless specifically indicated above     Objective:     BP 110/80   Pulse 62   Temp 98.2 F (36.8 C)   Ht 5\' 6"  (1.676 m)   Wt 258 lb (117 kg)   SpO2 99%   BMI 41.64 kg/m   Wt Readings from Last 3 Encounters:  11/08/20 258 lb (117 kg)  09/13/20 258 lb 14.4 oz (117.4 kg)  08/17/20 235 lb (106.6 kg)    Physical Exam Vitals reviewed.  Constitutional:      General: She is not in acute distress.    Appearance: She is well-developed and well-nourished. She is obese. She is not toxic-appearing.  HENT:     Head: Normocephalic and atraumatic.     Right Ear: Hearing, tympanic membrane, ear canal and external ear normal.     Left Ear: Hearing, tympanic membrane, ear canal and external ear normal.     Nose: Nose normal.     Mouth/Throat:     Mouth: Oropharynx is clear and moist. Mucous membranes are moist.     Pharynx: Oropharynx is clear. Uvula midline. No pharyngeal swelling, oropharyngeal exudate, posterior oropharyngeal erythema or uvula swelling.     Tonsils: No tonsillar exudate or tonsillar abscesses.  Comments: Posterior pharynx benign Cardiovascular:     Rate and Rhythm: Normal rate and regular rhythm.  Pulmonary:     Effort: Pulmonary effort is normal.     Breath sounds: Normal breath sounds. No wheezing.  Musculoskeletal:     Cervical back: Neck supple.  Lymphadenopathy:     Cervical: No cervical adenopathy.  Skin:    General: Skin is warm and dry.  Neurological:     Mental Status: She is alert and oriented to person, place, and time.  Psychiatric:        Mood and Affect: Mood and affect normal.        Behavior: Behavior normal.     RST  negative        Assessment & Plan:     Encounter Diagnosis  Name Primary?  . Sore throat Yes    -Pt is counseled to gargle with warm salt water, rest, fluids, apap or IBU prn.  She is to contact office if worsens or persists -pt is encouraged to get covid vaccination

## 2020-11-08 NOTE — Patient Instructions (Signed)
Sore Throat A sore throat is pain, burning, irritation, or scratchiness in the throat. When you have a sore throat, you may feel pain or tenderness in your throat when you swallow or talk. Many things can cause a sore throat, including:  An infection.  Seasonal allergies.  Dryness in the air.  Irritants, such as smoke or pollution.  Radiation treatment to the area.  Gastroesophageal reflux disease (GERD).  A tumor. A sore throat is often the first sign of another sickness. It may happen with other symptoms, such as coughing, sneezing, fever, and swollen neck glands. Most sore throats go away without medical treatment. Follow these instructions at home:  Take over-the-counter medicines only as told by your health care provider. ? If your child has a sore throat, do not give your child aspirin because of the association with Reye syndrome.  Drink enough fluids to keep your urine pale yellow.  Rest as needed.  To help with pain, try: ? Sipping warm liquids, such as broth, herbal tea, or warm water. ? Eating or drinking cold or frozen liquids, such as frozen ice pops. ? Gargling with a salt-water mixture 3-4 times a day or as needed. To make a salt-water mixture, completely dissolve -1 tsp (3-6 g) of salt in 1 cup (237 mL) of warm water. ? Sucking on hard candy or throat lozenges. ? Putting a cool-mist humidifier in your bedroom at night to moisten the air. ? Sitting in the bathroom with the door closed for 5-10 minutes while you run hot water in the shower.  Do not use any products that contain nicotine or tobacco, such as cigarettes, e-cigarettes, and chewing tobacco. If you need help quitting, ask your health care provider.  Wash your hands well and often with soap and water. If soap and water are not available, use hand sanitizer.      Contact a health care provider if:  You have a fever for more than 2-3 days.  You have symptoms that last (are persistent) for more than  2-3 days.  Your throat does not get better within 7 days.  You have a fever and your symptoms suddenly get worse.  Your child who is 3 months to 3 years old has a temperature of 102.2F (39C) or higher. Get help right away if:  You have difficulty breathing.  You cannot swallow fluids, soft foods, or your saliva.  You have increased swelling in your throat or neck.  You have persistent nausea and vomiting. Summary  A sore throat is pain, burning, irritation, or scratchiness in the throat. Many things can cause a sore throat.  Take over-the-counter medicines only as told by your health care provider. Do not give your child aspirin.  Drink plenty of fluids, and rest as needed.  Contact a health care provider if your symptoms worsen or your sore throat does not get better within 7 days. This information is not intended to replace advice given to you by your health care provider. Make sure you discuss any questions you have with your health care provider. Document Revised: 03/01/2018 Document Reviewed: 03/01/2018 Elsevier Patient Education  2021 [employer]  

## 2020-11-28 ENCOUNTER — Other Ambulatory Visit: Payer: Self-pay | Admitting: Physician Assistant

## 2020-12-04 ENCOUNTER — Other Ambulatory Visit: Payer: Self-pay | Admitting: Physician Assistant

## 2020-12-04 DIAGNOSIS — E785 Hyperlipidemia, unspecified: Secondary | ICD-10-CM

## 2020-12-04 DIAGNOSIS — R7989 Other specified abnormal findings of blood chemistry: Secondary | ICD-10-CM

## 2020-12-13 ENCOUNTER — Other Ambulatory Visit: Payer: Self-pay | Admitting: Physician Assistant

## 2020-12-18 ENCOUNTER — Ambulatory Visit: Payer: Self-pay | Admitting: Physician Assistant

## 2020-12-19 ENCOUNTER — Ambulatory Visit: Payer: Self-pay | Admitting: Physician Assistant

## 2020-12-25 ENCOUNTER — Ambulatory Visit: Payer: Self-pay | Admitting: Physician Assistant

## 2020-12-26 ENCOUNTER — Other Ambulatory Visit (HOSPITAL_COMMUNITY)
Admission: RE | Admit: 2020-12-26 | Discharge: 2020-12-26 | Disposition: A | Discharge status: Home / Self Care | Payer: Self-pay | Source: Ambulatory Visit | Attending: Physician Assistant | Admitting: Physician Assistant

## 2020-12-26 ENCOUNTER — Other Ambulatory Visit: Payer: Self-pay

## 2020-12-26 DIAGNOSIS — R7989 Other specified abnormal findings of blood chemistry: Secondary | ICD-10-CM | POA: Insufficient documentation

## 2020-12-26 DIAGNOSIS — E785 Hyperlipidemia, unspecified: Secondary | ICD-10-CM | POA: Insufficient documentation

## 2020-12-26 LAB — COMPREHENSIVE METABOLIC PANEL
ALT: 50 U/L — ABNORMAL HIGH (ref 0–44)
AST: 38 U/L (ref 15–41)
Albumin: 4.1 g/dL (ref 3.5–5.0)
Alkaline Phosphatase: 35 U/L — ABNORMAL LOW (ref 38–126)
Anion gap: 10 (ref 5–15)
BUN: 17 mg/dL (ref 6–20)
CO2: 21 mmol/L — ABNORMAL LOW (ref 22–32)
Calcium: 9.5 mg/dL (ref 8.9–10.3)
Chloride: 109 mmol/L (ref 98–111)
Creatinine, Ser: 0.88 mg/dL (ref 0.44–1.00)
GFR, Estimated: >60 mL/min (ref >60)
Glucose, Bld: 101 mg/dL — ABNORMAL HIGH (ref 70–99)
Potassium: 3.7 mmol/L (ref 3.5–5.1)
Sodium: 140 mmol/L (ref 135–145)
Total Bilirubin: 0.5 mg/dL (ref 0.3–1.2)
Total Protein: 7.1 g/dL (ref 6.5–8.1)

## 2020-12-26 LAB — LIPID PANEL
Cholesterol: 157 mg/dL (ref 0–200)
HDL: 29 mg/dL — ABNORMAL LOW (ref >40)
LDL Cholesterol: 95 mg/dL (ref 0–99)
Total CHOL/HDL Ratio: 5.4 RATIO
Triglycerides: 163 mg/dL — ABNORMAL HIGH (ref <150)
VLDL: 33 mg/dL (ref 0–40)

## 2020-12-27 ENCOUNTER — Encounter: Payer: Self-pay | Admitting: Physician Assistant

## 2020-12-31 ENCOUNTER — Other Ambulatory Visit: Payer: Self-pay

## 2020-12-31 ENCOUNTER — Encounter: Payer: Self-pay | Admitting: Physician Assistant

## 2020-12-31 ENCOUNTER — Ambulatory Visit: Payer: Self-pay | Admitting: Physician Assistant

## 2020-12-31 VITALS — BP 110/66 | HR 86 | Temp 96.9°F | Wt 260.0 lb

## 2020-12-31 DIAGNOSIS — Z1239 Encounter for other screening for malignant neoplasm of breast: Secondary | ICD-10-CM

## 2020-12-31 DIAGNOSIS — R55 Syncope and collapse: Secondary | ICD-10-CM

## 2020-12-31 DIAGNOSIS — F419 Anxiety disorder, unspecified: Secondary | ICD-10-CM

## 2020-12-31 DIAGNOSIS — E785 Hyperlipidemia, unspecified: Secondary | ICD-10-CM

## 2020-12-31 NOTE — Progress Notes (Signed)
BP 110/66   Pulse 86   Temp (!) 96.9 F (36.1 C)   Wt 260 lb (117.9 kg)   SpO2 96%   BMI 41.97 kg/m    Subjective:    Patient ID: [name], female    DOB: 1981-01-16, 40 y.o.   MRN: [id number]  HPI: [name] is a 40 y.o. female presenting on 12/31/2020 for Hyperlipidemia and Mental Health Problem   HPI   Pt had a negative covid 19 screening questionnaire.   Chief Complaint  Patient presents with  . Hyperlipidemia  . Mental Health Problem     Pt says she is Stressed but doing okay (related to sick child and other issues related to her family).  She is still not smoking.  She is exercising.     She has not received the covid vaccination.  She reports No problems today.    She does say that she had 1 episode of "falling out" that occurred 2 weeks ago when she bended over to pick up something.  She says she felt light-headed when she bended over and then lights out.  She does not report any chest pains or other chest problems prior to the episode or any vision changes.  She says she felt fine upon re-awakening and has not had a re-occurrence of this.       Relevant past medical, surgical, family and social history reviewed and updated as indicated. Interim medical history since our last visit reviewed. Allergies and medications reviewed and updated.   Current Outpatient Medications:  .  albuterol (VENTOLIN HFA) 108 (90 Base) MCG/ACT inhaler, Inhale 2 puffs into the lungs every 6 (six) hours as needed for wheezing or shortness of breath., Disp: 8 g, Rfl: 1 .  citalopram (CELEXA) 40 MG tablet, Take 1 tablet by mouth once daily, Disp: 30 tablet, Rfl: 1 .  fenofibrate (TRICOR) 145 MG tablet, Take 1 tablet (145 mg total) by mouth daily., Disp: 30 tablet, Rfl: 6 .  IRON PO, Take 1 tablet by mouth daily., Disp: , Rfl:  .  JENCYCLA 0.35 MG tablet, Take 1 tablet by mouth once daily, Disp: 84 tablet, Rfl: 3 .  megestrol (MEGACE) 40 MG tablet, TAKE 1 TABLET BY MOUTH THREE  TIMES DAILY UNTIL BLEEDING STOPS, THEN TAKE 1 TABLET ONCE DAILY UNTIL FOLLOW UP, Disp: 45 tablet, Rfl: 1 .  meloxicam (MOBIC) 15 MG tablet, TAKE 1 TABLET BY MOUTH ONCE DAILY AS NEEDED FOR PAIN, Disp: 30 tablet, Rfl: 0 .  simvastatin (ZOCOR) 40 MG tablet, Take 1 tablet (40 mg total) by mouth at bedtime., Disp: 30 tablet, Rfl: 6    Review of Systems  Per HPI unless specifically indicated above     Objective:    BP 110/66   Pulse 86   Temp (!) 96.9 F (36.1 C)   Wt 260 lb (117.9 kg)   SpO2 96%   BMI 41.97 kg/m   Wt Readings from Last 3 Encounters:  12/31/20 260 lb (117.9 kg)  11/08/20 258 lb (117 kg)  09/13/20 258 lb 14.4 oz (117.4 kg)    Physical Exam Vitals reviewed.  Constitutional:      General: She is not in acute distress.    Appearance: She is well-developed. She is obese. She is not ill-appearing.  HENT:     Head: Normocephalic and atraumatic.  Cardiovascular:     Rate and Rhythm: Normal rate and regular rhythm.     Pulses: Normal pulses.  Heart sounds: Normal heart sounds.  Pulmonary:     Effort: Pulmonary effort is normal.     Breath sounds: Normal breath sounds.  Abdominal:     General: Bowel sounds are normal.     Palpations: Abdomen is soft. There is no mass.     Tenderness: There is no abdominal tenderness.  Musculoskeletal:     Cervical back: Neck supple.     Right lower leg: No edema.     Left lower leg: No edema.  Lymphadenopathy:     Cervical: No cervical adenopathy.  Skin:    General: Skin is warm and dry.  Neurological:     Mental Status: She is alert and oriented to person, place, and time.  Psychiatric:        Attention and Perception: Attention normal.        Speech: Speech normal.        Behavior: Behavior normal. Behavior is cooperative.     Results for orders placed or performed during the hospital encounter of 12/26/20  Lipid panel  Result Value Ref Range   Cholesterol 157 0 - 200 mg/dL   Triglycerides 235 (H) <150 mg/dL    HDL 29 (L) >57 mg/dL   Total CHOL/HDL Ratio 5.4 RATIO   VLDL 33 0 - 40 mg/dL   LDL Cholesterol 95 0 - 99 mg/dL  Comprehensive metabolic panel  Result Value Ref Range   Sodium 140 135 - 145 mmol/L   Potassium 3.7 3.5 - 5.1 mmol/L   Chloride 109 98 - 111 mmol/L   CO2 21 (L) 22 - 32 mmol/L   Glucose, Bld 101 (H) 70 - 99 mg/dL   BUN 17 6 - 20 mg/dL   Creatinine, Ser 3.22 0.44 - 1.00 mg/dL   Calcium 9.5 8.9 - 02.5 mg/dL   Total Protein 7.1 6.5 - 8.1 g/dL   Albumin 4.1 3.5 - 5.0 g/dL   AST 38 15 - 41 U/L   ALT 50 (H) 0 - 44 U/L   Alkaline Phosphatase 35 (L) 38 - 126 U/L   Total Bilirubin 0.5 0.3 - 1.2 mg/dL   GFR, Estimated >42 >70 mL/min   Anion gap 10 5 - 15        EKG- NSR at 60 bpm.  No st-t changes.   No previous for comparison.   Assessment & Plan:    Encounter Diagnoses  Name Primary?  . Hyperlipidemia, unspecified hyperlipidemia type Yes  . Anxiety   . Syncope, unspecified syncope type   . Encounter for screening for malignant neoplasm of breast, unspecified screening modality   . Morbid obesity (HCC)       -Reviewed labs with pt -pt to continue current medications -encouraged pt to get covid vaccination -will refer for screening Mammogram -Counseled to stay hydrated, be watchful for feelings of light-headedness, get plenty of sleep.  Also encouraged pt to use caution with how she bends over.  Notify office if has another LOC. -pt to follow up 4 months.  Will update PAP at that appt.  Pt to contact office sooner prn

## 2021-01-08 ENCOUNTER — Telehealth: Payer: Self-pay

## 2021-01-08 NOTE — Telephone Encounter (Signed)
Called pt to inform of mammogram appointment on 01/16/21 @ 12 pm. Advised pt to arrive by 11:45 am to get registered. Advised pt no deodorant, lotion, powder or perfume to breast or underarm area.

## 2021-01-14 ENCOUNTER — Other Ambulatory Visit: Payer: Self-pay | Admitting: Obstetrics & Gynecology

## 2021-01-15 ENCOUNTER — Other Ambulatory Visit: Payer: Self-pay | Admitting: Physician Assistant

## 2021-01-16 ENCOUNTER — Encounter (HOSPITAL_COMMUNITY): Payer: Self-pay

## 2021-01-16 ENCOUNTER — Ambulatory Visit (HOSPITAL_COMMUNITY)
Admission: RE | Admit: 2021-01-16 | Discharge: 2021-01-16 | Disposition: A | Discharge status: Home / Self Care | Payer: Self-pay | Source: Ambulatory Visit | Attending: Physician Assistant | Admitting: Physician Assistant

## 2021-01-16 DIAGNOSIS — Z1239 Encounter for other screening for malignant neoplasm of breast: Secondary | ICD-10-CM | POA: Insufficient documentation

## 2021-01-17 ENCOUNTER — Telehealth: Payer: Self-pay | Admitting: Physician Assistant

## 2021-01-17 NOTE — Telephone Encounter (Signed)
Patient called in stating she was having left should and left knee pain. Patient stated she has had not injured either shoulder or knee. She has had these symptoms for move than 1 week. She stated she has no fever, little swelling but has gone away, and it is not hot. Patient was given an appointment for Monday morning and was advised to take tylenol and IB proven and ice 10 to 20 minutes every couple of hours. If she gets worse go to ER or Urgent Care.

## 2021-01-18 ENCOUNTER — Other Ambulatory Visit (HOSPITAL_COMMUNITY): Payer: Self-pay | Admitting: Physician Assistant

## 2021-01-18 DIAGNOSIS — R928 Other abnormal and inconclusive findings on diagnostic imaging of breast: Secondary | ICD-10-CM

## 2021-01-21 ENCOUNTER — Ambulatory Visit (HOSPITAL_COMMUNITY)
Admission: RE | Admit: 2021-01-21 | Discharge: 2021-01-21 | Disposition: A | Discharge status: Home / Self Care | Payer: Self-pay | Source: Ambulatory Visit | Attending: Physician Assistant | Admitting: Physician Assistant

## 2021-01-21 ENCOUNTER — Other Ambulatory Visit: Payer: Self-pay

## 2021-01-21 ENCOUNTER — Ambulatory Visit: Payer: Self-pay | Admitting: Physician Assistant

## 2021-01-21 ENCOUNTER — Encounter: Payer: Self-pay | Admitting: Physician Assistant

## 2021-01-21 ENCOUNTER — Other Ambulatory Visit (HOSPITAL_COMMUNITY)
Admission: RE | Admit: 2021-01-21 | Discharge: 2021-01-21 | Disposition: A | Discharge status: Home / Self Care | Payer: Self-pay | Source: Ambulatory Visit | Attending: Physician Assistant | Admitting: Physician Assistant

## 2021-01-21 VITALS — BP 124/72

## 2021-01-21 DIAGNOSIS — M25562 Pain in left knee: Secondary | ICD-10-CM

## 2021-01-21 DIAGNOSIS — M25512 Pain in left shoulder: Secondary | ICD-10-CM

## 2021-01-21 DIAGNOSIS — R69 Illness, unspecified: Secondary | ICD-10-CM | POA: Insufficient documentation

## 2021-01-21 DIAGNOSIS — M25551 Pain in right hip: Secondary | ICD-10-CM | POA: Insufficient documentation

## 2021-01-21 LAB — SEDIMENTATION RATE: Sed Rate: 7 mm/hr (ref 0–22)

## 2021-01-21 MED ORDER — PREDNISONE 10 MG (21) PO TBPK: ORAL_TABLET | ORAL | 0 refills | Status: AC

## 2021-01-21 NOTE — Progress Notes (Signed)
BP 124/72    Subjective:    Patient ID: [name], female    DOB: [birth year], 40 y.o.   MRN: [id number]  HPI: [name] is a 40 y.o. female presenting on 01/21/2021 for Shoulder Pain and Knee Pain   HPI    Pt had a negative covid 19 screening questionnaire.   Chief Complaint  Patient presents with  . Shoulder Pain  . Knee Pain     Pt complains of  limitied L shoulder ROM x 1 week.  No injury.   It hurts.  No problems with the shoulder in the past.   Left Knee pain also that comes and goes.  It is Not bad like it was last week.    Pt is right hand dominant.  mobic helps the hip pain but not the shoulder.  She has seen orthopedics for her hip.       Relevant past medical, surgical, family and social history reviewed and updated as indicated. Interim medical history since our last visit reviewed. Allergies and medications reviewed and updated.   Current Outpatient Medications:  .  albuterol (VENTOLIN HFA) 108 (90 Base) MCG/ACT inhaler, Inhale 2 puffs into the lungs every 6 (six) hours as needed for wheezing or shortness of breath., Disp: 8 g, Rfl: 1 .  citalopram (CELEXA) 40 MG tablet, Take 1 tablet by mouth once daily, Disp: 30 tablet, Rfl: 1 .  fenofibrate (TRICOR) 145 MG tablet, Take 1 tablet (145 mg total) by mouth daily., Disp: 30 tablet, Rfl: 6 .  IRON PO, Take 1 tablet by mouth daily., Disp: , Rfl:  .  JENCYCLA 0.35 MG tablet, Take 1 tablet by mouth once daily, Disp: 84 tablet, Rfl: 3 .  megestrol (MEGACE) 40 MG tablet, TAKE 1 TABLET BY MOUTH THREE TIMES DAILY UNTIL  BLEEDING  STOPS,  THEN  TAKE  1  TABLET  ONCE  DAILY  UNTIL  FOLLOW  UP., Disp: 45 tablet, Rfl: 3 .  meloxicam (MOBIC) 15 MG tablet, TAKE 1 TABLET BY MOUTH ONCE DAILY AS NEEDED FOR PAIN, Disp: 30 tablet, Rfl: 0 .  simvastatin (ZOCOR) 40 MG tablet, Take 1 tablet (40 mg total) by mouth at bedtime., Disp: 30 tablet, Rfl: 6    Review of Systems  Per HPI unless specifically indicated  above     Objective:    BP 124/72   Wt Readings from Last 3 Encounters:  12/31/20 260 lb (117.9 kg)  11/08/20 258 lb (117 kg)  09/13/20 258 lb 14.4 oz (117.4 kg)    Physical Exam Constitutional:      General: She is not in acute distress.    Appearance: She is not toxic-appearing.  HENT:     Head: Normocephalic and atraumatic.  Pulmonary:     Effort: No respiratory distress.  Musculoskeletal:     Right shoulder: Normal. No deformity or tenderness. Normal range of motion.     Left shoulder: Tenderness present. No deformity. Decreased range of motion.     Right knee: Normal.     Left knee: No swelling or deformity. Normal range of motion. No tenderness.     Right lower leg: No edema.     Left lower leg: No edema.     Comments: L shoulderTenderness anteriorly.  No point tenderness.  L knee without laxity.   Neurological:     Mental Status: She is alert and oriented to person, place, and time.  Psychiatric:  Attention and Perception: Attention normal.        Behavior: Behavior is cooperative.               Assessment & Plan:    Encounter Diagnoses  Name Primary?  . Acute pain of left shoulder Yes  . Acute pain of left knee   . Right hip pain        -Check esr, ra -Check xray shoulder -gave sample Prednisone 12m pack

## 2021-01-22 LAB — RHEUMATOID FACTOR: Rheumatoid fact SerPl-aCnc: <10 IU/mL (ref <14.0)

## 2021-01-29 ENCOUNTER — Ambulatory Visit (HOSPITAL_COMMUNITY)
Admission: RE | Admit: 2021-01-29 | Discharge: 2021-01-29 | Disposition: A | Discharge status: Home / Self Care | Payer: Self-pay | Source: Ambulatory Visit | Attending: Physician Assistant | Admitting: Physician Assistant

## 2021-01-29 DIAGNOSIS — R928 Other abnormal and inconclusive findings on diagnostic imaging of breast: Secondary | ICD-10-CM | POA: Insufficient documentation

## 2021-02-06 ENCOUNTER — Other Ambulatory Visit: Payer: Self-pay | Admitting: Physician Assistant

## 2021-02-12 ENCOUNTER — Other Ambulatory Visit (HOSPITAL_COMMUNITY): Payer: Self-pay

## 2021-02-12 ENCOUNTER — Ambulatory Visit (HOSPITAL_COMMUNITY): Payer: Self-pay

## 2021-02-13 ENCOUNTER — Other Ambulatory Visit: Payer: Self-pay | Admitting: Physician Assistant

## 2021-02-15 ENCOUNTER — Telehealth: Payer: Self-pay

## 2021-02-15 NOTE — Telephone Encounter (Signed)
Returned client's call. She would like to reschedule her renewal appointment for [facility] program. Rescheduled for 02/18/21 at 0900.  Francee Nodal RN [employer]

## 2021-02-18 NOTE — Congregational Nurse Program (Signed)
Patient attended [facility] to complete Renewal Enrollment for [facility] uninsured program   Current medical needs interview ws completed. Pt last saw her PCP [facility]11.[facility].   Pt denies any medical complaints at this time and does wish to continue to remain at [facility] to receive medical care.    Her next appt at [facility] is scheduled on May 07, 2021 @ 8:30am  Reviewed Health Screening Questionnaire to identify socio-determinant health needs and to help pt access needed resources in addition to their medical needs   Pt Needs/Resources identified were:  FOOD      Plan  -Update RN Nurse Case Manager, Norval Gable to continue with medical case management.  -Refer to [facility] Food Guide to provide food pantry/mobile market community food list  -See Care Coordination Note for  Care Connect enrollment eligibility period

## 2021-02-23 ENCOUNTER — Other Ambulatory Visit: Payer: Self-pay | Admitting: Physician Assistant

## 2021-02-27 ENCOUNTER — Ambulatory Visit: Payer: Self-pay | Admitting: Physician Assistant

## 2021-02-27 ENCOUNTER — Encounter: Payer: Self-pay | Admitting: Physician Assistant

## 2021-02-27 ENCOUNTER — Other Ambulatory Visit: Payer: Self-pay

## 2021-02-27 VITALS — BP 132/76 | HR 70 | Temp 96.6°F

## 2021-02-27 DIAGNOSIS — M25512 Pain in left shoulder: Secondary | ICD-10-CM

## 2021-02-27 DIAGNOSIS — N6452 Nipple discharge: Secondary | ICD-10-CM

## 2021-02-27 DIAGNOSIS — M25551 Pain in right hip: Secondary | ICD-10-CM

## 2021-02-27 DIAGNOSIS — F419 Anxiety disorder, unspecified: Secondary | ICD-10-CM

## 2021-02-27 NOTE — Progress Notes (Signed)
BP 132/76   Pulse 70   Temp (!) 96.6 F (35.9 C)   SpO2 97%    Subjective:    Patient ID: [name], female    DOB: 1981/07/13, 40 y.o.   MRN: [id number]  HPI: [name] is a 40 y.o. female presenting on 02/27/2021 for No chief complaint on file.   HPI  Pt had a negative covid 19 screening questionnaire.   Pt says her Mood is good.  Her Stress is still high but she is dealing with it well.    Her leg pain is better but still flares up sometimes.  Shoulder improved also.  She is walking regularly.  Left nipple leaking blood x 1 1/2 days.  She denies abrasion, trauma, rough play, other causes for bleeding.      Relevant past medical, surgical, family and social history reviewed and updated as indicated. Interim medical history since our last visit reviewed. Allergies and medications reviewed and updated.     Current Outpatient Medications:  .  citalopram (CELEXA) 40 MG tablet, Take 1 tablet by mouth once daily, Disp: 30 tablet, Rfl: 3 .  fenofibrate (TRICOR) 145 MG tablet, Take 1 tablet (145 mg total) by mouth daily., Disp: 30 tablet, Rfl: 6 .  IRON PO, Take 1 tablet by mouth daily., Disp: , Rfl:  .  JENCYCLA 0.35 MG tablet, Take 1 tablet by mouth once daily, Disp: 84 tablet, Rfl: 3 .  megestrol (MEGACE) 40 MG tablet, TAKE 1 TABLET BY MOUTH THREE TIMES DAILY UNTIL  BLEEDING  STOPS,  THEN  TAKE  1  TABLET  ONCE  DAILY  UNTIL  FOLLOW  UP., Disp: 45 tablet, Rfl: 3 .  meloxicam (MOBIC) 15 MG tablet, TAKE 1 TABLET BY MOUTH ONCE DAILY AS NEEDED FOR PAIN, Disp: 30 tablet, Rfl: 0 .  simvastatin (ZOCOR) 40 MG tablet, Take 1 tablet (40 mg total) by mouth at bedtime., Disp: 30 tablet, Rfl: 6 .  albuterol (VENTOLIN HFA) 108 (90 Base) MCG/ACT inhaler, Inhale 2 puffs into the lungs every 6 (six) hours as needed for wheezing or shortness of breath. (Patient not taking: Reported on 02/27/2021), Disp: 8 g, Rfl: 1     Review of Systems  Per HPI unless specifically indicated  above     Objective:    BP 132/76   Pulse 70   Temp (!) 96.6 F (35.9 C)   SpO2 97%   Wt Readings from Last 3 Encounters:  12/31/20 260 lb (117.9 kg)  11/08/20 258 lb (117 kg)  09/13/20 258 lb 14.4 oz (117.4 kg)    Physical Exam Vitals reviewed.  Constitutional:      General: She is not in acute distress.    Appearance: She is well-developed. She is not ill-appearing.  HENT:     Head: Normocephalic and atraumatic.  Cardiovascular:     Rate and Rhythm: Normal rate and regular rhythm.  Pulmonary:     Effort: Pulmonary effort is normal.     Breath sounds: Normal breath sounds.  Chest:  Breasts:     Right: Normal. No axillary adenopathy.     Left: Bleeding present. No mass, nipple discharge, tenderness or axillary adenopathy.      Comments: There is a small amount blood oozing from end of L nipple Abdominal:     General: Bowel sounds are normal.     Palpations: Abdomen is soft. There is no mass.     Tenderness: There is no abdominal tenderness.  Musculoskeletal:     Cervical back: Neck supple.     Right lower leg: No edema.     Left lower leg: No edema.  Lymphadenopathy:     Cervical: No cervical adenopathy.     Upper Body:     Right upper body: No axillary adenopathy.     Left upper body: No axillary adenopathy.  Skin:    General: Skin is warm and dry.  Neurological:     Mental Status: She is alert and oriented to person, place, and time.  Psychiatric:        Behavior: Behavior normal.               Assessment & Plan:    Encounter Diagnoses  Name Primary?  Marland Kitchen Anxiety Yes  . Right hip pain   . Acute pain of left shoulder   . Bloody discharge from left nipple       -pt to continue current medications -Diagnostic mammogram done in April was normal but bleeding from nipple is abnormal so will refer for further evaluation and treatment -She is in process of getting [employer] financial assistance application renewed -Pt is encouraged to get  covid vaccination -pt to follow up in July as scheduled.  She is to contact office sooner prn

## 2021-03-07 ENCOUNTER — Other Ambulatory Visit: Payer: Self-pay | Admitting: Physician Assistant

## 2021-03-07 DIAGNOSIS — N6452 Nipple discharge: Secondary | ICD-10-CM

## 2021-03-19 ENCOUNTER — Encounter: Payer: Self-pay | Admitting: Emergency Medicine

## 2021-03-19 ENCOUNTER — Ambulatory Visit
Admission: EM | Admit: 2021-03-19 | Discharge: 2021-03-19 | Disposition: A | Discharge status: Home / Self Care | Payer: Self-pay | Attending: Family Medicine | Admitting: Family Medicine

## 2021-03-19 ENCOUNTER — Other Ambulatory Visit: Payer: Self-pay

## 2021-03-19 DIAGNOSIS — J0140 Acute pansinusitis, unspecified: Secondary | ICD-10-CM

## 2021-03-19 DIAGNOSIS — J209 Acute bronchitis, unspecified: Secondary | ICD-10-CM

## 2021-03-19 DIAGNOSIS — J069 Acute upper respiratory infection, unspecified: Secondary | ICD-10-CM

## 2021-03-19 MED ORDER — BENZONATATE 100 MG PO CAPS: 100.0000 mg | ORAL_CAPSULE | Freq: Three times a day (TID) | ORAL | 0 refills | Status: DC

## 2021-03-19 MED ORDER — KETOROLAC TROMETHAMINE 30 MG/ML IJ SOLN
30.0000 mg | Freq: Once | INTRAMUSCULAR | Status: AC
  Administered 2021-03-19: 30 mg via INTRAMUSCULAR

## 2021-03-19 MED ORDER — METHYLPREDNISOLONE SODIUM SUCC 125 MG IJ SOLR
125.0000 mg | Freq: Once | INTRAMUSCULAR | Status: AC
  Administered 2021-03-19: 125 mg via INTRAMUSCULAR

## 2021-03-19 MED ORDER — AMOXICILLIN-POT CLAVULANATE 875-125 MG PO TABS: 1.0000 | ORAL_TABLET | Freq: Two times a day (BID) | ORAL | 0 refills | Status: AC

## 2021-03-19 NOTE — ED Triage Notes (Signed)
Nasal congestion and pressure with headache since Friday.  Cough with yellow sputum.

## 2021-03-19 NOTE — ED Provider Notes (Signed)
[name]   [id number] 03/19/21 Arrival Time: 9562  [id number] THROAT  SUBJECTIVE: History from: patient.  [name] is a 40 y.o. female who presents with abrupt onset of nasal congestion, headache, cough, sinus paina nd pressure, fatigue for the last 4 days.  Denies sick exposure to Covid, strep, flu or mono, or precipitating event. Has negative history of Covid. Has not had Covid vaccines. Has tried OTC cough and cold without relief. There are no aggravating symptoms. Denies previous symptoms in the past.     Denies fever, chills, ear pain, rhinorrhea, cough, SOB, wheezing, chest pain, nausea, rash, changes in bowel or bladder habits.    ROS: As per HPI.  All other pertinent ROS negative.     Past Medical History:  Diagnosis Date   Hypercholesteremia    Past Surgical History:  Procedure Laterality Date   CHOLECYSTECTOMY N/A 07/13/2014   Procedure: LAPAROSCOPIC CHOLECYSTECTOMY;  Surgeon: Jocelyn Hatcher, MD;  Location: [facility];  Service: General;  Laterality: N/A;   DENTAL SURGERY  2018   plate top & bottom   TUBAL LIGATION     No Known Allergies No current facility-administered medications on file prior to encounter.   Current Outpatient Medications on File Prior to Encounter  Medication Sig Dispense Refill   albuterol (VENTOLIN HFA) 108 (90 Base) MCG/ACT inhaler Inhale 2 puffs into the lungs every 6 (six) hours as needed for wheezing or shortness of breath. 8 g 1   citalopram (CELEXA) 40 MG tablet Take 1 tablet by mouth once daily 30 tablet 3   fenofibrate (TRICOR) 145 MG tablet Take 1 tablet (145 mg total) by mouth daily. 30 tablet 6   IRON PO Take 1 tablet by mouth daily.     JENCYCLA 0.35 MG tablet Take 1 tablet by mouth once daily 84 tablet 3   megestrol (MEGACE) 40 MG tablet TAKE 1 TABLET BY MOUTH THREE TIMES DAILY UNTIL  BLEEDING  STOPS,  THEN  TAKE  1  TABLET  ONCE  DAILY  UNTIL  FOLLOW  UP. 45 tablet 3   meloxicam (MOBIC) 15 MG tablet TAKE 1 TABLET BY  MOUTH ONCE DAILY AS NEEDED FOR PAIN 30 tablet 0   simvastatin (ZOCOR) 40 MG tablet Take 1 tablet (40 mg total) by mouth at bedtime. 30 tablet 6   Social History   Socioeconomic History   Marital status: Married    Spouse name: Not on file   Number of children: 2   Years of education: Not on file   Highest education level: Not on file  Occupational History   Not on file  Tobacco Use   Smoking status: Former    Packs/day: 0.50    Years: 15.00    Pack years: 7.50    Types: Cigarettes    Quit date: 11/14/2019    Years since quitting: 1.3   Smokeless tobacco: Never  Vaping Use   Vaping Use: Never used  Substance and Sexual Activity   Alcohol use: No   Drug use: No   Sexual activity: Yes    Birth control/protection: Surgical, Pill  Other Topics Concern   Not on file  Social History Narrative   Not on file   Social Determinants of Health   Financial Resource Strain: Not on file  Food Insecurity: Food Insecurity Present   Worried About Running Out of Food in the Last Year: Often true   Ran Out of Food in the Last Year: Never true  Transportation Needs:  Not on file  Physical Activity: Not on file  Stress: Not on file  Social Connections: Not on file  Intimate Partner Violence: Not on file   Family History  Problem Relation Age of Onset   Heart disease Mother    Diabetes Mother    Heart disease Father    Hypertension Father    Thyroid disease Father    Hyperlipidemia Father    Breast cancer Paternal Grandmother     OBJECTIVE:  Vitals:   03/19/21 1015  BP: 126/82  Pulse: 63  Resp: 18  Temp: (!) 97.1 F (36.2 C)  TempSrc: Temporal  SpO2: 98%     General appearance: alert; appears fatigued, but nontoxic, speaking in full sentences and managing own secretions HEENT: NCAT; Ears: EACs clear, TMs pearly gray with visible cone of light, without erythema; Eyes: PERRL, EOMI grossly; Nose: no obvious rhinorrhea; Throat: oropharynx clear, tonsils 1+ and mildly  erythematous without white tonsillar exudates, uvula midline; Sinuses: tender to palpation Neck: supple with LAD Lungs: diffuse wheezing bilaterally; cough absent Heart: regular rate and rhythm.  Radial pulses 2+ symmetrical bilaterally Skin: warm and dry Psychological: alert and cooperative; normal mood and affect  LABS: No results found for this or any previous visit (from the past 24 hour(s)).   ASSESSMENT & PLAN:  1. Acute non-recurrent pansinusitis   2. URI with cough and congestion   3. Acute bronchitis, unspecified organism     Meds ordered this encounter  Medications   amoxicillin-clavulanate (AUGMENTIN) 875-125 MG tablet    Sig: Take 1 tablet by mouth 2 (two) times daily for 7 days.    Dispense:  14 tablet    Refill:  0    Order Specific Question:   Supervising Provider    Answer:   [name] [[id number]]   benzonatate (TESSALON) 100 MG capsule    Sig: Take 1 capsule (100 mg total) by mouth every 8 (eight) hours.    Dispense:  21 capsule    Refill:  0    Order Specific Question:   Supervising Provider    Answer:   [name] [id number]   ketorolac (TORADOL) 30 MG/ML injection 30 mg   methylPREDNISolone sodium succinate (SOLU-MEDROL) 125 mg/2 mL injection 125 mg    Solumedrol 125mg  IM in office today Toradol 30mg  IM in office today for headache Push fluids and get rest Prescribed augmentin 875mg  twice daily for 7 days.   Take as directed and to completion.  Tessalon perles prescribed for cough prn Drink warm or cool liquids, use throat lozenges, or popsicles to help alleviate symptoms Take OTC ibuprofen or tylenol as needed for pain May use Zyrtec D and flonase to help alleviate symptoms Follow up with PCP if symptoms persist Return or go to ER if you have any new or worsening symptoms such as fever, chills, nausea, vomiting, worsening sore throat, cough, abdominal pain, chest pain, changes in bowel or bladder habits. Declines Covid screening  today  Reviewed expectations re: course of current medical issues. Questions answered. Outlined signs and symptoms indicating need for more acute intervention. Patient verbalized understanding. After Visit Summary given.          , NP 03/22/21 [phone]

## 2021-03-19 NOTE — Discharge Instructions (Addendum)
I have sent in Augmentin for you to take twice a day for 7 days.  I have sent in tessalon perles for you to use one capsule every 8 hours as needed for cough.  You have received toradol and a steroid as injections in the office today  Follow up with this office or with primary care if symptoms are persisting.  Follow up in the ER for high fever, trouble swallowing, trouble breathing, other concerning symptoms.

## 2021-03-21 ENCOUNTER — Ambulatory Visit (INDEPENDENT_AMBULATORY_CARE_PROVIDER_SITE_OTHER): Payer: Self-pay | Admitting: General Surgery

## 2021-03-21 ENCOUNTER — Encounter: Payer: Self-pay | Admitting: General Surgery

## 2021-03-21 ENCOUNTER — Other Ambulatory Visit: Payer: Self-pay

## 2021-03-21 VITALS — BP 126/76 | HR 60 | Temp 98.3°F | Resp 16 | Ht 66.0 in | Wt 267.0 lb

## 2021-03-21 DIAGNOSIS — N6452 Nipple discharge: Secondary | ICD-10-CM | POA: Insufficient documentation

## 2021-03-21 NOTE — Patient Instructions (Signed)
Duct excision of the left breast

## 2021-03-21 NOTE — Progress Notes (Signed)
[facility] History and Physical  Reason for Referral: Left nipple bloody discharge  Referring Physician:  Jacquelin Hawking PA  Chief Complaint   New Patient (Initial Visit)     [name] is a 40 y.o. female.  HPI: [name] is a 40 yo who has had bloody left nipple discharge that is spontaneous for about 4 months now. She has had [place] and mammogram without any findings.  The patient has no history of any masses, lumps, bumps, nipple changes or discharge. She had menarche at age 71, and her first pregnancy at age 58. She has a history of any family breast cancer in her paternal aunts. She underwent menopause at 55.  She has never had any previous biopsies or concerning areas on mammogram. She denied any prior chest radiation.   She also has paternal uncles with colon cancer.   Past Medical History:  Diagnosis Date   Hypercholesteremia     Past Surgical History:  Procedure Laterality Date   CHOLECYSTECTOMY N/A 07/13/2014   Procedure: LAPAROSCOPIC CHOLECYSTECTOMY;  Surgeon: Marlane Hatcher, MD;  Location: [facility];  Service: General;  Laterality: N/A;   DENTAL SURGERY  2018   plate top & bottom   TUBAL LIGATION      Family History  Problem Relation Age of Onset   Heart disease Mother    Diabetes Mother    Heart disease Father    Hypertension Father    Thyroid disease Father    Hyperlipidemia Father    Breast cancer Paternal Grandmother     Social History   Tobacco Use   Smoking status: Former    Packs/day: 0.50    Years: 15.00    Pack years: 7.50    Types: Cigarettes    Quit date: 11/14/2019    Years since quitting: 1.3   Smokeless tobacco: Never  Vaping Use   Vaping Use: Never used  Substance Use Topics   Alcohol use: No   Drug use: No    Medications: I have reviewed the patient's current medications. Allergies as of 03/21/2021   No Known Allergies      Medication List        Accurate as of March 21, 2021  1:41 PM. If you have any  questions, ask your nurse or doctor.          albuterol 108 (90 Base) MCG/ACT inhaler Commonly known as: VENTOLIN HFA Inhale 2 puffs into the lungs every 6 (six) hours as needed for wheezing or shortness of breath.   amoxicillin-clavulanate 875-125 MG tablet Commonly known as: Augmentin Take 1 tablet by mouth 2 (two) times daily for 7 days.   benzonatate 100 MG capsule Commonly known as: TESSALON Take 1 capsule (100 mg total) by mouth every 8 (eight) hours.   citalopram 40 MG tablet Commonly known as: CELEXA Take 1 tablet by mouth once daily   fenofibrate 145 MG tablet Commonly known as: TRICOR Take 1 tablet (145 mg total) by mouth daily.   IRON PO Take 1 tablet by mouth daily.   Jencycla 0.35 MG tablet Generic drug: norethindrone Take 1 tablet by mouth once daily   megestrol 40 MG tablet Commonly known as: MEGACE TAKE 1 TABLET BY MOUTH THREE TIMES DAILY UNTIL  BLEEDING  STOPS,  THEN  TAKE  1  TABLET  ONCE  DAILY  UNTIL  FOLLOW  UP.   meloxicam 15 MG tablet Commonly known as: MOBIC TAKE 1 TABLET BY MOUTH ONCE DAILY AS NEEDED  FOR PAIN   simvastatin 40 MG tablet Commonly known as: ZOCOR Take 1 tablet (40 mg total) by mouth at bedtime.         ROS:  A comprehensive review of systems was negative except for: Integument/breast: positive for left breast bloody spontaneous discharge  Blood pressure 126/76, pulse 60, temperature 98.3 F (36.8 C), temperature source Other (Comment), resp. rate 16, height 5\' 6"  (1.676 m), weight 267 lb (121.1 kg), SpO2 96 %. Physical Exam Vitals reviewed.  Constitutional:      Appearance: Normal appearance.  HENT:     Head: Normocephalic.     Nose: Nose normal.     Mouth/Throat:     Mouth: Mucous membranes are moist.  Eyes:     Extraocular Movements: Extraocular movements intact.  Cardiovascular:     Rate and Rhythm: Normal rate and regular rhythm.  Pulmonary:     Effort: Pulmonary effort is normal.     Breath sounds:  Normal breath sounds.  Chest:  Breasts:    Right: Nipple discharge present. No mass, skin change or tenderness.     Left: Nipple discharge present. No mass, skin change or tenderness.     Comments: Right breast with clear drainage from duct with pressure, no bloody drainage, left breast with bloody drainage from duct Abdominal:     General: There is no distension.     Palpations: Abdomen is soft.     Tenderness: There is no abdominal tenderness.  Musculoskeletal:        General: No swelling.     Cervical back: Normal range of motion.     Comments: Left arm dermal stud piercing, metal  Skin:    General: Skin is warm.  Neurological:     General: No focal deficit present.     Mental Status: She is alert and oriented to person, place, and time.  Psychiatric:        Mood and Affect: Mood normal.        Behavior: Behavior normal.        Thought Content: Thought content normal.        Judgment: Judgment normal.    Results: CLINICAL DATA:  Screening recall for a possible left breast asymmetry.   EXAM: DIGITAL DIAGNOSTIC UNILATERAL LEFT MAMMOGRAM WITH TOMOSYNTHESIS AND CAD; ULTRASOUND LEFT BREAST LIMITED   TECHNIQUE: Left digital diagnostic mammography and breast tomosynthesis was performed. The images were evaluated with computer-aided detection.; Targeted ultrasound examination of the left breast was performed   COMPARISON:  Current screening exam, baseline study, 01/16/2021.   ACR Breast Density Category b: There are scattered areas of fibroglandular density.   FINDINGS: The area of possible asymmetry, in the lateral retroareolar left breast, disperses on spot compression imaging consistent with normal fibroglandular tissue. There is no underlying mass. There are no areas of architectural distortion and no suspicious calcifications.   On physical exam, there is no palpable mass in the lateral periareolar left breast.   Targeted ultrasound is performed, showing normal  tissue throughout the lateral breast. No mass or suspicious lesion.   IMPRESSION: 1. No evidence of breast malignancy. 2. Physiologic asymmetry the fibroglandular tissue.   RECOMMENDATION: Screening mammogram in one year.(Code:SM-B-01Y)   I have discussed the findings and recommendations with the patient. If applicable, a reminder letter will be sent to the patient regarding the next appointment.   BI-RADS CATEGORY  1: Negative.     Electronically Signed   By: 03/18/2021 M.D.   On: 01/29/2021 09:33  CLINICAL DATA:  Screening.   EXAM: DIGITAL SCREENING BILATERAL MAMMOGRAM WITH TOMOSYNTHESIS AND CAD   TECHNIQUE: Bilateral screening digital craniocaudal and mediolateral oblique mammograms were obtained. Bilateral screening digital breast tomosynthesis was performed. The images were evaluated with computer-aided detection.   COMPARISON:  None.   ACR Breast Density Category b: There are scattered areas of fibroglandular density.   FINDINGS: In the left breast, a possible asymmetry warrants further evaluation. In the right breast, no findings suspicious for malignancy.   IMPRESSION: Further evaluation is suggested for possible asymmetry in the left breast.   RECOMMENDATION: Diagnostic mammogram and possibly ultrasound of the left breast. (Code:FI-L-88M)   The patient will be contacted regarding the findings, and additional imaging will be scheduled.   BI-RADS CATEGORY  0: Incomplete. Need additional imaging evaluation and/or prior mammograms for comparison.     Electronically Signed   By: Baird Lyons M.D.   On: 01/17/2021 14:32     Assessment & Plan:  SHAMIRAH IVAN is a 40 y.o. female with left breast single duct bloody discharge. Discussed option of MRI but given her piercing this will have to be removed to do the MRI. Discussed option of excision to rule out anything concerning causing the bloody discharge. 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 We can place tape on the piercing with surgery but still at risk for burning the area with cautery. Discussed risk of bleeding, infection, nipple necrosis, discussed retro-areola excision if no discharge when I do the surgery, discussed ductal excisional biopsy and lacrimal probe and dye use to identify the duct in question. Discussed potential of finding cancer and needing additional surgery.    All questions were answered to the satisfaction of the patient.   Lucretia Roers 03/21/2021, 1:41 PM

## 2021-03-22 ENCOUNTER — Encounter: Payer: Self-pay | Admitting: General Surgery

## 2021-03-22 NOTE — H&P (Signed)
[facility] History and Physical  Reason for Referral: Left nipple bloody discharge  Referring Physician:  Jacquelin Hawking PA  Chief Complaint   New Patient (Initial Visit)     [name] is a 40 y.o. female.  HPI: [name] is a 40 yo who has had bloody left nipple discharge that is spontaneous for about 4 months now. She has had [place] and mammogram without any findings.  The patient has no history of any masses, lumps, bumps, nipple changes or discharge. She had menarche at age 71, and her first pregnancy at age 58. She has a history of any family breast cancer in her paternal aunts. She underwent menopause at 55.  She has never had any previous biopsies or concerning areas on mammogram. She denied any prior chest radiation.   She also has paternal uncles with colon cancer.   Past Medical History:  Diagnosis Date   Hypercholesteremia     Past Surgical History:  Procedure Laterality Date   CHOLECYSTECTOMY N/A 07/13/2014   Procedure: LAPAROSCOPIC CHOLECYSTECTOMY;  Surgeon: Marlane Hatcher, MD;  Location: [facility];  Service: General;  Laterality: N/A;   DENTAL SURGERY  2018   plate top & bottom   TUBAL LIGATION      Family History  Problem Relation Age of Onset   Heart disease Mother    Diabetes Mother    Heart disease Father    Hypertension Father    Thyroid disease Father    Hyperlipidemia Father    Breast cancer Paternal Grandmother     Social History   Tobacco Use   Smoking status: Former    Packs/day: 0.50    Years: 15.00    Pack years: 7.50    Types: Cigarettes    Quit date: 11/14/2019    Years since quitting: 1.3   Smokeless tobacco: Never  Vaping Use   Vaping Use: Never used  Substance Use Topics   Alcohol use: No   Drug use: No    Medications: I have reviewed the patient's current medications. Allergies as of 03/21/2021   No Known Allergies      Medication List        Accurate as of March 21, 2021  1:41 PM. If you have any  questions, ask your nurse or doctor.          albuterol 108 (90 Base) MCG/ACT inhaler Commonly known as: VENTOLIN HFA Inhale 2 puffs into the lungs every 6 (six) hours as needed for wheezing or shortness of breath.   amoxicillin-clavulanate 875-125 MG tablet Commonly known as: Augmentin Take 1 tablet by mouth 2 (two) times daily for 7 days.   benzonatate 100 MG capsule Commonly known as: TESSALON Take 1 capsule (100 mg total) by mouth every 8 (eight) hours.   citalopram 40 MG tablet Commonly known as: CELEXA Take 1 tablet by mouth once daily   fenofibrate 145 MG tablet Commonly known as: TRICOR Take 1 tablet (145 mg total) by mouth daily.   IRON PO Take 1 tablet by mouth daily.   Jencycla 0.35 MG tablet Generic drug: norethindrone Take 1 tablet by mouth once daily   megestrol 40 MG tablet Commonly known as: MEGACE TAKE 1 TABLET BY MOUTH THREE TIMES DAILY UNTIL  BLEEDING  STOPS,  THEN  TAKE  1  TABLET  ONCE  DAILY  UNTIL  FOLLOW  UP.   meloxicam 15 MG tablet Commonly known as: MOBIC TAKE 1 TABLET BY MOUTH ONCE DAILY AS NEEDED  FOR PAIN   simvastatin 40 MG tablet Commonly known as: ZOCOR Take 1 tablet (40 mg total) by mouth at bedtime.         ROS:  A comprehensive review of systems was negative except for: Integument/breast: positive for left breast bloody spontaneous discharge  Blood pressure 126/76, pulse 60, temperature 98.3 F (36.8 C), temperature source Other (Comment), resp. rate 16, height 5\' 6"  (1.676 m), weight 267 lb (121.1 kg), SpO2 96 %. Physical Exam Vitals reviewed.  Constitutional:      Appearance: Normal appearance.  HENT:     Head: Normocephalic.     Nose: Nose normal.     Mouth/Throat:     Mouth: Mucous membranes are moist.  Eyes:     Extraocular Movements: Extraocular movements intact.  Cardiovascular:     Rate and Rhythm: Normal rate and regular rhythm.  Pulmonary:     Effort: Pulmonary effort is normal.     Breath sounds:  Normal breath sounds.  Chest:  Breasts:    Right: Nipple discharge present. No mass, skin change or tenderness.     Left: Nipple discharge present. No mass, skin change or tenderness.     Comments: Right breast with clear drainage from duct with pressure, no bloody drainage, left breast with bloody drainage from duct Abdominal:     General: There is no distension.     Palpations: Abdomen is soft.     Tenderness: There is no abdominal tenderness.  Musculoskeletal:        General: No swelling.     Cervical back: Normal range of motion.     Comments: Left arm dermal stud piercing, metal  Skin:    General: Skin is warm.  Neurological:     General: No focal deficit present.     Mental Status: She is alert and oriented to person, place, and time.  Psychiatric:        Mood and Affect: Mood normal.        Behavior: Behavior normal.        Thought Content: Thought content normal.        Judgment: Judgment normal.    Results: CLINICAL DATA:  Screening recall for a possible left breast asymmetry.   EXAM: DIGITAL DIAGNOSTIC UNILATERAL LEFT MAMMOGRAM WITH TOMOSYNTHESIS AND CAD; ULTRASOUND LEFT BREAST LIMITED   TECHNIQUE: Left digital diagnostic mammography and breast tomosynthesis was performed. The images were evaluated with computer-aided detection.; Targeted ultrasound examination of the left breast was performed   COMPARISON:  Current screening exam, baseline study, 01/16/2021.   ACR Breast Density Category b: There are scattered areas of fibroglandular density.   FINDINGS: The area of possible asymmetry, in the lateral retroareolar left breast, disperses on spot compression imaging consistent with normal fibroglandular tissue. There is no underlying mass. There are no areas of architectural distortion and no suspicious calcifications.   On physical exam, there is no palpable mass in the lateral periareolar left breast.   Targeted ultrasound is performed, showing normal  tissue throughout the lateral breast. No mass or suspicious lesion.   IMPRESSION: 1. No evidence of breast malignancy. 2. Physiologic asymmetry the fibroglandular tissue.   RECOMMENDATION: Screening mammogram in one year.(Code:SM-B-01Y)   I have discussed the findings and recommendations with the patient. If applicable, a reminder letter will be sent to the patient regarding the next appointment.   BI-RADS CATEGORY  1: Negative.     Electronically Signed   By: 03/18/2021 M.D.   On: 01/29/2021 09:33  CLINICAL DATA:  Screening.   EXAM: DIGITAL SCREENING BILATERAL MAMMOGRAM WITH TOMOSYNTHESIS AND CAD   TECHNIQUE: Bilateral screening digital craniocaudal and mediolateral oblique mammograms were obtained. Bilateral screening digital breast tomosynthesis was performed. The images were evaluated with computer-aided detection.   COMPARISON:  None.   ACR Breast Density Category b: There are scattered areas of fibroglandular density.   FINDINGS: In the left breast, a possible asymmetry warrants further evaluation. In the right breast, no findings suspicious for malignancy.   IMPRESSION: Further evaluation is suggested for possible asymmetry in the left breast.   RECOMMENDATION: Diagnostic mammogram and possibly ultrasound of the left breast. (Code:FI-L-00M)   The patient will be contacted regarding the findings, and additional imaging will be scheduled.   BI-RADS CATEGORY  0: Incomplete. Need additional imaging evaluation and/or prior mammograms for comparison.     Electronically Signed   By: Dina  Arceo M.D.   On: 01/17/2021 14:32     Assessment & Plan:  Lonetta A Sharron is a 39 y.o. female with left breast single duct bloody discharge. Discussed option of MRI but given her piercing this will have to be removed to do the MRI. Discussed option of excision to rule out anything concerning causing the bloody discharge. 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 We can place tape on the piercing with surgery but still at risk for burning the area with cautery. Discussed risk of bleeding, infection, nipple necrosis, discussed retro-areola excision if no discharge when I do the surgery, discussed ductal excisional biopsy and lacrimal probe and dye use to identify the duct in question. Discussed potential of finding cancer and needing additional surgery.    All questions were answered to the satisfaction of the patient.   Breckon Reeves C Jobie Popp 03/21/2021, 1:41 PM       

## 2021-03-27 ENCOUNTER — Other Ambulatory Visit: Payer: Self-pay

## 2021-03-27 ENCOUNTER — Ambulatory Visit
Admission: EM | Admit: 2021-03-27 | Discharge: 2021-03-27 | Disposition: A | Discharge status: Home / Self Care | Payer: Self-pay | Attending: Family Medicine | Admitting: Family Medicine

## 2021-03-27 DIAGNOSIS — J329 Chronic sinusitis, unspecified: Secondary | ICD-10-CM

## 2021-03-27 DIAGNOSIS — H66002 Acute suppurative otitis media without spontaneous rupture of ear drum, left ear: Secondary | ICD-10-CM

## 2021-03-27 HISTORY — DX: Chronic sinusitis, unspecified: J32.9

## 2021-03-27 MED ORDER — CEFDINIR 300 MG PO CAPS: 300.0000 mg | ORAL_CAPSULE | Freq: Two times a day (BID) | ORAL | 0 refills | Status: DC

## 2021-03-27 NOTE — ED Provider Notes (Signed)
[facility]   [id number] 03/27/21 Arrival Time: 0801  ASSESSMENT & PLAN:  1. Non-recurrent acute suppurative otitis media of left ear without spontaneous rupture of tympanic membrane    Begin: Meds ordered this encounter  Medications   cefdinir (OMNICEF) 300 MG capsule    Sig: Take 1 capsule (300 mg total) by mouth 2 (two) times daily.    Dispense:  20 capsule    Refill:  0   OTC symptom care as needed. Ensure adequate fluid intake and rest. May f/u with PCP or here as needed.  Reviewed expectations re: course of current medical issues. Questions answered. Outlined signs and symptoms indicating need for more acute intervention. Patient verbalized understanding. After Visit Summary given.   SUBJECTIVE: History from: patient.  [name] is a 40 y.o. female who was recently tx for sinusitis; finished antibiotic last week. She presents with complaint of left otalgia; without drainage; without bleeding. Onset gradual,  past 1-2 d . Still congested. Fever: no. Overall normal PO intake without n/v. Sick contacts: no.  Social History   Tobacco Use  Smoking Status Former   Packs/day: 0.50   Years: 15.00   Pack years: 7.50   Types: Cigarettes   Quit date: 11/14/2019   Years since quitting: 1.3  Smokeless Tobacco Never     OBJECTIVE:  Vitals:   03/27/21 0811  BP: 119/82  Pulse: 87  Resp: 18  Temp: 98.7 F (37.1 C)  SpO2: 96%     General appearance: alert; appears fatigued Ear Canal: normal TM: left: erythematous, dull, bulging Neck: supple without LAD Lungs: unlabored respirations, symmetrical air entry; cough: absent; no respiratory distress Skin: warm and dry Psychological: alert and cooperative; normal mood and affect  No Known Allergies  Past Medical History:  Diagnosis Date   Hypercholesteremia    Family History  Problem Relation Age of Onset   Heart disease Mother    Diabetes Mother    Heart disease Father    Hypertension Father     Thyroid disease Father    Hyperlipidemia Father    Breast cancer Paternal Aunt    Cancer - Colon Paternal Uncle    Breast cancer Paternal Grandmother    Social History   Socioeconomic History   Marital status: Married    Spouse name: Not on file   Number of children: 2   Years of education: Not on file   Highest education level: Not on file  Occupational History   Not on file  Tobacco Use   Smoking status: Former    Packs/day: 0.50    Years: 15.00    Pack years: 7.50    Types: Cigarettes    Quit date: 11/14/2019    Years since quitting: 1.3   Smokeless tobacco: Never  Vaping Use   Vaping Use: Never used  Substance and Sexual Activity   Alcohol use: No   Drug use: No   Sexual activity: Yes    Birth control/protection: Surgical, Pill  Other Topics Concern   Not on file  Social History Narrative   Not on file   Social Determinants of Health   Financial Resource Strain: Not on file  Food Insecurity: Food Insecurity Present   Worried About Programme researcher, broadcasting/film/video in the Last Year: Often true   Ran Out of Food in the Last Year: Never true  Transportation Needs: Not on file  Physical Activity: Not on file  Stress: Not on file  Social Connections: Not on file  Intimate Partner Violence: Not on file             Mardella Layman, MD 03/27/21 [phone]

## 2021-03-27 NOTE — ED Triage Notes (Signed)
Pt presents with bilateral ear pain and has had URI for past 2 weeks , unresolved with antibiotics

## 2021-03-28 NOTE — Patient Instructions (Signed)
[name]  03/28/2021     @PREFPERIOPPHARMACY @   Your procedure is scheduled on 04/03/2021.   Report to 04/05/2021 at 0700 A.M.   Call this number if you have problems the morning of surgery:  [phone]   Remember:      Take these medicines the morning of surgery with A SIP OF WATER       Celexa, Mobic (if needed).      Please brush your teeth.  Do not wear jewelry, make-up or nail polish.  Do not wear lotions, powders, or perfumes, or deodorant.  Do not shave 48 hours prior to surgery.  Men may shave face and neck.  Do not bring valuables to the hospital.  [facility] is not responsible for any belongings or valuables.  Contacts, dentures or bridgework may not be worn into surgery.  Leave your suitcase in the car.  After surgery it may be brought to your room.  For patients admitted to the hospital, discharge time will be determined by your treatment team.  Patients discharged the day of surgery will not be allowed to drive home and must have someone with them for 24 hours.    Special instructions:   DO NOT smoke tobacco or vape for 24 hours before your procedure.  Please read over the following fact sheets that you were given. Coughing and Deep Breathing, Surgical Site Infection Prevention, Anesthesia Post-op Instructions, and Care and Recovery After Surgery      Breast Biopsy, Care After These instructions give you information about caring for yourself after your procedure. Your doctor may also give you more specific instructions. Call yourdoctor if you have any problems or questions after your procedure. What can I expect after the procedure? After your procedure, it is common to have: Bruising on your breast. Numbness, tingling, or pain near your biopsy site. Follow these instructions at home: Medicines Take over-the-counter and prescription medicines only as told by your doctor. Do not drive for 24 hours if you were given a medicine to  help you relax (sedative) during your procedure. Do not drink alcohol while taking pain medicine. Do not drive or use heavy machinery while taking prescription pain medicine. Biopsy site care     Follow instructions from your doctor about how to take care of your cut from surgery (incision) or your puncture area. Make sure you: Wash your hands with soap and water before you change your bandage (dressing). If you cannot use soap and water, use hand sanitizer. Change your bandage as told by your doctor. Leave stitches (sutures), skin glue, or skin tape (adhesive strips) in place. They may need to stay in place for 2 weeks or longer. If tape strips get loose and curl up, you may trim the loose edges. Do not remove tape strips completely unless your doctor says it is okay. If you have stitches, keep them dry when you take a bath or a shower. Check your cut or puncture area every day for signs of infection. Check for: Redness, swelling, or pain. Fluid or blood. Warmth. Pus or a bad smell. Protect the biopsy area. Do not let the area get bumped. Activity If you had a cut during your procedure, avoid activities that could pull your cut open. These include: Stretching. Reaching over your head. Exercise. Sports. Lifting anything that weighs more than 3 lb (1.4 kg). Return to your normal activities as told by your doctor. Ask your doctor what  activities are safe for you. Managing pain, stiffness, and swelling If told, put ice on the biopsy site to relieve swelling: Put ice in a plastic bag. Place a towel between your skin and the bag. Leave the ice on for 20 minutes, 2-3 times a day. General instructions Continue your normal diet. Wear a good support bra for as long as told by your doctor. Get checked for extra fluid around your lymph nodes (lymphedema) as often as told by your doctor. Keep all follow-up visits as told by your doctor. This is important. Contact a doctor if: You notice any  of the following at the biopsy site: More redness, swelling, or pain. More fluid or blood coming from the site. The site feels warm to the touch. Pus or a bad smell coming from the site. The site breaks open after the stitches or skin tape strips have been removed. You have a rash. You have a fever. Get help right away if: You have more bleeding from the biopsy site. Get help right away if bleeding is more than a small spot. You have trouble breathing. You have red streaks around the biopsy site. Summary After your procedure, it is common to have bruising, numbness, tingling, or pain near the biopsy site. Do not drive or use heavy machinery while taking prescription pain medicine. Wear a good support bra for as long as told by your doctor. If you had a cut during your procedure, avoid activities that may pull the cut open. Ask your doctor what activities are safe for you. This information is not intended to replace advice given to you by your health care provider. Make sure you discuss any questions you have with your healthcare provider. Document Revised: 06/11/2020 Document Reviewed: 03/18/2018 Elsevier Patient Education  2022 [employer] General Anesthesia, Adult, Care After This sheet gives you information about how to care for yourself after your procedure. Your health care provider may also give you more specific instructions. If you have problems or questions, contact your health careprovider. What can I expect after the procedure? After the procedure, the following side effects are common: Pain or discomfort at the IV site. Nausea. Vomiting. Sore throat. Trouble concentrating. Feeling cold or chills. Feeling weak or tired. Sleepiness and fatigue. Soreness and body aches. These side effects can affect parts of the body that were not involved in surgery. Follow these instructions at home: For the time period you were told by your health care provider:  Rest. Do not  participate in activities where you could fall or become injured. Do not drive or use machinery. Do not drink alcohol. Do not take sleeping pills or medicines that cause drowsiness. Do not make important decisions or sign legal documents. Do not take care of children on your own.  Eating and drinking Follow any instructions from your health care provider about eating or drinking restrictions. When you feel hungry, start by eating small amounts of foods that are soft and easy to digest (bland), such as toast. Gradually return to your regular diet. Drink enough fluid to keep your urine pale yellow. If you vomit, rehydrate by drinking water, juice, or clear broth. General instructions If you have sleep apnea, surgery and certain medicines can increase your risk for breathing problems. Follow instructions from your health care provider about wearing your sleep device: Anytime you are sleeping, including during daytime naps. While taking prescription pain medicines, sleeping medicines, or medicines that make you drowsy. Have a responsible adult stay with you for  the time you are told. It is important to have someone help care for you until you are awake and alert. Return to your normal activities as told by your health care provider. Ask your health care provider what activities are safe for you. Take over-the-counter and prescription medicines only as told by your health care provider. If you smoke, do not smoke without supervision. Keep all follow-up visits as told by your health care provider. This is important. Contact a health care provider if: You have nausea or vomiting that does not get better with medicine. You cannot eat or drink without vomiting. You have pain that does not get better with medicine. You are unable to pass urine. You develop a skin rash. You have a fever. You have redness around your IV site that gets worse. Get help right away if: You have difficulty  breathing. You have chest pain. You have blood in your urine or stool, or you vomit blood. Summary After the procedure, it is common to have a sore throat or nausea. It is also common to feel tired. Have a responsible adult stay with you for the time you are told. It is important to have someone help care for you until you are awake and alert. When you feel hungry, start by eating small amounts of foods that are soft and easy to digest (bland), such as toast. Gradually return to your regular diet. Drink enough fluid to keep your urine pale yellow. Return to your normal activities as told by your health care provider. Ask your health care provider what activities are safe for you. This information is not intended to replace advice given to you by your health care provider. Make sure you discuss any questions you have with your healthcare provider. Document Revised: 06/14/2020 Document Reviewed: 01/12/2020 Elsevier Patient Education  2022 Elsevier Inc. How to Use Chlorhexidine for Bathing Chlorhexidine gluconate (CHG) is a germ-killing (antiseptic) solution that is used to clean the skin. It can get rid of the bacteria that normally live on the skin and can keep them away for about 24 hours. To clean your skin with CHG, you may be given: A CHG solution to use in the shower or as part of a sponge bath. A prepackaged cloth that contains CHG. Cleaning your skin with CHG may help lower the risk for infection: While you are staying in the intensive care unit of the hospital. If you have a vascular access, such as a central line, to provide short-term or long-term access to your veins. If you have a catheter to drain urine from your bladder. If you are on a ventilator. A ventilator is a machine that helps you breathe by moving air in and out of your lungs. After surgery. What are the risks? Risks of using CHG include: A skin reaction. Hearing loss, if CHG gets in your ears. Eye injury, if CHG gets  in your eyes and is not rinsed out. The CHG product catching fire. Make sure that you avoid smoking and flames after applying CHG to your skin. Do not use CHG: If you have a chlorhexidine allergy or have previously reacted to chlorhexidine. On babies younger than 64 months of age. How to use CHG solution Use CHG only as told by your health care provider, and follow the instructions on the label. Use the full amount of CHG as directed. Usually, this is one bottle. During a shower Follow these steps when using CHG solution during a shower (unless your health care  provider gives you different instructions): Start the shower. Use your normal soap and shampoo to wash your face and hair. Turn off the shower or move out of the shower stream. Pour the CHG onto a clean washcloth. Do not use any type of brush or rough-edged sponge. Starting at your neck, lather your body down to your toes. Make sure you follow these instructions: If you will be having surgery, pay special attention to the part of your body where you will be having surgery. Scrub this area for at least 1 minute. Do not use CHG on your head or face. If the solution gets into your ears or eyes, rinse them well with water. Avoid your genital area. Avoid any areas of skin that have broken skin, cuts, or scrapes. Scrub your back and under your arms. Make sure to wash skin folds. Let the lather sit on your skin for 1-2 minutes or as long as told by your health care provider. Thoroughly rinse your entire body in the shower. Make sure that all body creases and crevices are rinsed well. Dry off with a clean towel. Do not put any substances on your body afterward--such as powder, lotion, or perfume--unless you are told to do so by your health care provider. Only use lotions that are recommended by the manufacturer. Put on clean clothes or pajamas. If it is the night before your surgery, sleep in clean sheets.  During a sponge bath Follow these  steps when using CHG solution during a sponge bath (unless your health care provider gives you different instructions): Use your normal soap and shampoo to wash your face and hair. Pour the CHG onto a clean washcloth. Starting at your neck, lather your body down to your toes. Make sure you follow these instructions: If you will be having surgery, pay special attention to the part of your body where you will be having surgery. Scrub this area for at least 1 minute. Do not use CHG on your head or face. If the solution gets into your ears or eyes, rinse them well with water. Avoid your genital area. Avoid any areas of skin that have broken skin, cuts, or scrapes. Scrub your back and under your arms. Make sure to wash skin folds. Let the lather sit on your skin for 1-2 minutes or as long as told by your health care provider. Using a different clean, wet washcloth, thoroughly rinse your entire body. Make sure that all body creases and crevices are rinsed well. Dry off with a clean towel. Do not put any substances on your body afterward--such as powder, lotion, or perfume--unless you are told to do so by your health care provider. Only use lotions that are recommended by the manufacturer. Put on clean clothes or pajamas. If it is the night before your surgery, sleep in clean sheets. How to use CHG prepackaged cloths Only use CHG cloths as told by your health care provider, and follow the instructions on the label. Use the CHG cloth on clean, dry skin. Do not use the CHG cloth on your head or face unless your health care provider tells you to. When washing with the CHG cloth: Avoid your genital area. Avoid any areas of skin that have broken skin, cuts, or scrapes. Before surgery Follow these steps when using a CHG cloth to clean before surgery (unless your health care provider gives you different instructions): Using the CHG cloth, vigorously scrub the part of your body where you will be having  surgery.  Scrub using a back-and-forth motion for 3 minutes. The area on your body should be completely wet with CHG when you are done scrubbing. Do not rinse. Discard the cloth and let the area air-dry. Do not put any substances on the area afterward, such as powder, lotion, or perfume. Put on clean clothes or pajamas. If it is the night before your surgery, sleep in clean sheets.  For general bathing Follow these steps when using CHG cloths for general bathing (unless your health care provider gives you different instructions). Use a separate CHG cloth for each area of your body. Make sure you wash between any folds of skin and between your fingers and toes. Wash your body in the following order, switching to a new cloth after each step: The front of your neck, shoulders, and chest. Both of your arms, under your arms, and your hands. Your stomach and groin area, avoiding the genitals. Your right leg and foot. Your left leg and foot. The back of your neck, your back, and your buttocks. Do not rinse. Discard the cloth and let the area air-dry. Do not put any substances on your body afterward--such as powder, lotion, or perfume--unless you are told to do so by your health care provider. Only use lotions that are recommended by the manufacturer. Put on clean clothes or pajamas. Contact a health care provider if: Your skin gets irritated after scrubbing. You have questions about using your solution or cloth. Get help right away if: Your eyes become very red or swollen. Your eyes itch badly. Your skin itches badly and is red or swollen. Your hearing changes. You have trouble seeing. You have swelling or tingling in your mouth or throat. You have trouble breathing. You swallow any chlorhexidine. Summary Chlorhexidine gluconate (CHG) is a germ-killing (antiseptic) solution that is used to clean the skin. Cleaning your skin with CHG may help to lower your risk for infection. You may be given  CHG to use for bathing. It may be in a bottle or in a prepackaged cloth to use on your skin. Carefully follow your health care provider's instructions and the instructions on the product label. Do not use CHG if you have a chlorhexidine allergy. Contact your health care provider if your skin gets irritated after scrubbing. This information is not intended to replace advice given to you by your health care provider. Make sure you discuss any questions you have with your healthcare provider. Document Revised: 02/10/2020 Document Reviewed: 03/16/2020 Elsevier Patient Education  2022 [employer].

## 2021-04-01 ENCOUNTER — Encounter (HOSPITAL_COMMUNITY)
Admission: RE | Admit: 2021-04-01 | Discharge: 2021-04-01 | Disposition: A | Discharge status: Home / Self Care | Payer: Self-pay | Source: Ambulatory Visit | Attending: General Surgery | Admitting: General Surgery

## 2021-04-01 ENCOUNTER — Encounter: Payer: Self-pay | Admitting: Physician Assistant

## 2021-04-01 ENCOUNTER — Encounter (HOSPITAL_COMMUNITY): Payer: Self-pay

## 2021-04-01 ENCOUNTER — Other Ambulatory Visit: Payer: Self-pay

## 2021-04-01 DIAGNOSIS — Z01812 Encounter for preprocedural laboratory examination: Secondary | ICD-10-CM | POA: Insufficient documentation

## 2021-04-01 HISTORY — DX: Unspecified osteoarthritis, unspecified site: M19.90

## 2021-04-01 HISTORY — DX: Anxiety disorder, unspecified: F41.9

## 2021-04-01 HISTORY — DX: Depression, unspecified: F32.A

## 2021-04-01 LAB — PREGNANCY, URINE: Preg Test, Ur: NEGATIVE

## 2021-04-01 NOTE — Progress Notes (Signed)
error 

## 2021-04-03 ENCOUNTER — Encounter (HOSPITAL_COMMUNITY): Payer: Self-pay | Admitting: General Surgery

## 2021-04-03 ENCOUNTER — Encounter (HOSPITAL_COMMUNITY)
Admission: RE | Disposition: A | Discharge status: Home / Self Care | Payer: Self-pay | Source: Home / Self Care | Attending: General Surgery

## 2021-04-03 ENCOUNTER — Ambulatory Visit (HOSPITAL_COMMUNITY): Payer: Self-pay | Admitting: Anesthesiology

## 2021-04-03 ENCOUNTER — Ambulatory Visit (HOSPITAL_COMMUNITY)
Admission: RE | Admit: 2021-04-03 | Discharge: 2021-04-03 | Disposition: A | Discharge status: Home / Self Care | Payer: Self-pay | Attending: General Surgery | Admitting: General Surgery

## 2021-04-03 DIAGNOSIS — N6042 Mammary duct ectasia of left breast: Secondary | ICD-10-CM | POA: Insufficient documentation

## 2021-04-03 DIAGNOSIS — Z79818 Long term (current) use of other agents affecting estrogen receptors and estrogen levels: Secondary | ICD-10-CM | POA: Insufficient documentation

## 2021-04-03 DIAGNOSIS — Z803 Family history of malignant neoplasm of breast: Secondary | ICD-10-CM | POA: Insufficient documentation

## 2021-04-03 DIAGNOSIS — N6452 Nipple discharge: Secondary | ICD-10-CM | POA: Insufficient documentation

## 2021-04-03 DIAGNOSIS — Z79899 Other long term (current) drug therapy: Secondary | ICD-10-CM | POA: Insufficient documentation

## 2021-04-03 DIAGNOSIS — Z87891 Personal history of nicotine dependence: Secondary | ICD-10-CM | POA: Insufficient documentation

## 2021-04-03 DIAGNOSIS — Z793 Long term (current) use of hormonal contraceptives: Secondary | ICD-10-CM | POA: Insufficient documentation

## 2021-04-03 HISTORY — PX: BREAST BIOPSY: SHX20

## 2021-04-03 SURGERY — BREAST BIOPSY
Anesthesia: General | Site: Breast | Laterality: Left

## 2021-04-03 MED ORDER — FENTANYL CITRATE (PF) 100 MCG/2ML IJ SOLN
INTRAMUSCULAR | Status: AC
  Filled 2021-04-03: qty 2

## 2021-04-03 MED ORDER — ONDANSETRON HCL 4 MG/2ML IJ SOLN: 4.0000 mg | Freq: Once | INTRAMUSCULAR | Status: DC | PRN

## 2021-04-03 MED ORDER — ONDANSETRON HCL 4 MG PO TABS: 4.0000 mg | ORAL_TABLET | Freq: Three times a day (TID) | ORAL | 1 refills | Status: DC | PRN

## 2021-04-03 MED ORDER — MIDAZOLAM HCL 2 MG/2ML IJ SOLN
INTRAMUSCULAR | Status: AC
  Filled 2021-04-03: qty 2

## 2021-04-03 MED ORDER — CHLORHEXIDINE GLUCONATE CLOTH 2 % EX PADS: 6.0000 | MEDICATED_PAD | Freq: Once | CUTANEOUS | Status: DC

## 2021-04-03 MED ORDER — BUPIVACAINE HCL (PF) 0.5 % IJ SOLN
INTRAMUSCULAR | Status: DC | PRN
  Administered 2021-04-03: 20 mL

## 2021-04-03 MED ORDER — DEXAMETHASONE SODIUM PHOSPHATE 10 MG/ML IJ SOLN
INTRAMUSCULAR | Status: AC
  Filled 2021-04-03: qty 1

## 2021-04-03 MED ORDER — MIDAZOLAM HCL 5 MG/5ML IJ SOLN
INTRAMUSCULAR | Status: DC | PRN
  Administered 2021-04-03: 2 mg via INTRAVENOUS

## 2021-04-03 MED ORDER — METHYLENE BLUE 0.5 % INJ SOLN
INTRAVENOUS | Status: AC
  Filled 2021-04-03: qty 10

## 2021-04-03 MED ORDER — DEXAMETHASONE SODIUM PHOSPHATE 4 MG/ML IJ SOLN
INTRAMUSCULAR | Status: DC | PRN
  Administered 2021-04-03: 5 mg via INTRAVENOUS

## 2021-04-03 MED ORDER — CEFAZOLIN SODIUM-DEXTROSE 2-4 GM/100ML-% IV SOLN
INTRAVENOUS | Status: AC
  Filled 2021-04-03: qty 100

## 2021-04-03 MED ORDER — BUPIVACAINE LIPOSOME 1.3 % IJ SUSP
INTRAMUSCULAR | Status: AC
  Filled 2021-04-03: qty 20

## 2021-04-03 MED ORDER — LIDOCAINE HCL (PF) 2 % IJ SOLN
INTRAMUSCULAR | Status: AC
  Filled 2021-04-03: qty 5

## 2021-04-03 MED ORDER — KETOROLAC TROMETHAMINE 30 MG/ML IJ SOLN
INTRAMUSCULAR | Status: DC | PRN
  Administered 2021-04-03: 30 mg via INTRAVENOUS

## 2021-04-03 MED ORDER — MEPERIDINE HCL 50 MG/ML IJ SOLN: 6.2500 mg | INTRAMUSCULAR | Status: DC | PRN

## 2021-04-03 MED ORDER — PROPOFOL 10 MG/ML IV BOLUS
INTRAVENOUS | Status: AC
  Filled 2021-04-03: qty 40

## 2021-04-03 MED ORDER — ONDANSETRON HCL 4 MG/2ML IJ SOLN
INTRAMUSCULAR | Status: AC
  Filled 2021-04-03: qty 2

## 2021-04-03 MED ORDER — CHLORHEXIDINE GLUCONATE 0.12 % MT SOLN
OROMUCOSAL | Status: AC
  Filled 2021-04-03: qty 15

## 2021-04-03 MED ORDER — LACTATED RINGERS IV SOLN: INTRAVENOUS | Status: DC

## 2021-04-03 MED ORDER — FENTANYL CITRATE (PF) 100 MCG/2ML IJ SOLN
INTRAMUSCULAR | Status: DC | PRN
  Administered 2021-04-03 (×2): 50 ug via INTRAVENOUS

## 2021-04-03 MED ORDER — LIDOCAINE 2% (20 MG/ML) 5 ML SYRINGE
INTRAMUSCULAR | Status: DC | PRN
  Administered 2021-04-03: 80 mg via INTRAVENOUS

## 2021-04-03 MED ORDER — ONDANSETRON HCL 4 MG/2ML IJ SOLN
INTRAMUSCULAR | Status: DC | PRN
  Administered 2021-04-03: 4 mg via INTRAVENOUS

## 2021-04-03 MED ORDER — CHLORHEXIDINE GLUCONATE 0.12 % MT SOLN
15.0000 mL | Freq: Once | OROMUCOSAL | Status: AC
  Administered 2021-04-03: 15 mL via OROMUCOSAL

## 2021-04-03 MED ORDER — CEFAZOLIN SODIUM-DEXTROSE 2-4 GM/100ML-% IV SOLN
2.0000 g | INTRAVENOUS | Status: AC
  Administered 2021-04-03: 2 g via INTRAVENOUS

## 2021-04-03 MED ORDER — PROPOFOL 10 MG/ML IV BOLUS
INTRAVENOUS | Status: DC | PRN
  Administered 2021-04-03: 200 mg via INTRAVENOUS

## 2021-04-03 MED ORDER — FENTANYL CITRATE (PF) 100 MCG/2ML IJ SOLN: 25.0000 ug | INTRAMUSCULAR | Status: DC | PRN

## 2021-04-03 MED ORDER — KETOROLAC TROMETHAMINE 30 MG/ML IJ SOLN
INTRAMUSCULAR | Status: AC
  Filled 2021-04-03: qty 1

## 2021-04-03 MED ORDER — OXYCODONE HCL 5 MG PO TABS: 5.0000 mg | ORAL_TABLET | ORAL | 0 refills | Status: DC | PRN

## 2021-04-03 MED ORDER — GLYCOPYRROLATE PF 0.2 MG/ML IJ SOSY
PREFILLED_SYRINGE | INTRAMUSCULAR | Status: AC
  Filled 2021-04-03: qty 1

## 2021-04-03 MED ORDER — METHYLENE BLUE 0.5 % INJ SOLN
INTRAVENOUS | Status: DC | PRN
  Administered 2021-04-03: 1 mL via SUBMUCOSAL

## 2021-04-03 MED ORDER — SODIUM CHLORIDE 0.9 % IR SOLN
Status: DC | PRN
  Administered 2021-04-03: 1000 mL

## 2021-04-03 MED ORDER — GLYCOPYRROLATE PF 0.2 MG/ML IJ SOSY
PREFILLED_SYRINGE | INTRAMUSCULAR | Status: DC | PRN
  Administered 2021-04-03 (×2): .1 mg via INTRAVENOUS

## 2021-04-03 MED ORDER — ORAL CARE MOUTH RINSE: 15.0000 mL | Freq: Once | OROMUCOSAL | Status: AC

## 2021-04-03 SURGICAL SUPPLY — 32 items
ADH SKN CLS APL DERMABOND .7 (GAUZE/BANDAGES/DRESSINGS) ×1
APL PRP STRL LF DISP 70% ISPRP (MISCELLANEOUS) ×1
BLADE SURG 15 STRL LF DISP TIS (BLADE) ×1 IMPLANT
BLADE SURG 15 STRL SS (BLADE) ×2
CHLORAPREP W/TINT 26 (MISCELLANEOUS) ×2 IMPLANT
CLOTH BEACON ORANGE TIMEOUT ST (SAFETY) ×2 IMPLANT
COVER LIGHT HANDLE STERIS (MISCELLANEOUS) ×4 IMPLANT
COVER WAND RF STERILE (DRAPES) ×2 IMPLANT
DECANTER SPIKE VIAL GLASS SM (MISCELLANEOUS) ×2 IMPLANT
DERMABOND ADVANCED (GAUZE/BANDAGES/DRESSINGS) ×1
DERMABOND ADVANCED .7 DNX12 (GAUZE/BANDAGES/DRESSINGS) ×1 IMPLANT
ELECT REM PT RETURN 9FT ADLT (ELECTROSURGICAL) ×2
ELECTRODE REM PT RTRN 9FT ADLT (ELECTROSURGICAL) ×1 IMPLANT
GLOVE SURG ENC MOIS LTX SZ6.5 (GLOVE) ×2 IMPLANT
GLOVE SURG UNDER POLY LF SZ6.5 (GLOVE) ×2 IMPLANT
GLOVE SURG UNDER POLY LF SZ7 (GLOVE) ×4 IMPLANT
GOWN STRL REUS W/TWL LRG LVL3 (GOWN DISPOSABLE) ×4 IMPLANT
KIT TURNOVER KIT A (KITS) ×2 IMPLANT
MANIFOLD NEPTUNE II (INSTRUMENTS) ×2 IMPLANT
NEEDLE HYPO 18GX1.5 BLUNT FILL (NEEDLE) ×2 IMPLANT
NEEDLE HYPO 25X1 1.5 SAFETY (NEEDLE) ×2 IMPLANT
NS IRRIG 1000ML POUR BTL (IV SOLUTION) ×4 IMPLANT
PACK MINOR (CUSTOM PROCEDURE TRAY) ×2 IMPLANT
PAD ARMBOARD 7.5X6 YLW CONV (MISCELLANEOUS) ×2 IMPLANT
SET BASIN LINEN APH (SET/KITS/TRAYS/PACK) ×2 IMPLANT
SUT MNCRL AB 4-0 PS2 18 (SUTURE) ×2 IMPLANT
SUT VIC AB 3-0 SH 27 (SUTURE) ×2
SUT VIC AB 3-0 SH 27X BRD (SUTURE) ×1 IMPLANT
SYR 10ML LL (SYRINGE) ×2 IMPLANT
SYR 20ML LL LF (SYRINGE) ×2 IMPLANT
SYR 5ML LL (SYRINGE) ×2 IMPLANT
SYR CONTROL 10ML LL (SYRINGE) ×2 IMPLANT

## 2021-04-03 NOTE — Op Note (Signed)
[facility] Operative Note  04/03/21  Preoperative Diagnosis: Left nipple bloody discharge    Postoperative Diagnosis: Same   Procedure(s) Performed: Excision of duct    Surgeon: Leatrice Jewels. Henreitta Leber, MD   Assistants: No qualified resident was available    Anesthesia: General endotracheal   Anesthesiologist: Dr. Deatra Canter     Specimens: Left breast duct, suture close to nipple    Estimated Blood Loss: Minimal   Blood Replacement: None    Complications: None   Wound Class: Clean    Operative Indications: Ms. Balla is a 40 yo with bloody nipple discharge and no findings on mammogram and [place] and inability to get an MRI due to piercings. We discussed excision and risk of bleeding, infection, use of methylene blue, allergic reactions, finding something like cancer and needing more surgery.   Findings: Single duct with bloody discharge   Procedure: The patient was taken to the operating room and placed supine. General endotracheal anesthesia was induced. Intravenous antibiotics were administered per protocol.  The left breast was prepared and draped in the usual sterile fashion.   The nipple was squeezed and blood drainage came from a single duct. A lacrimal probe was inserted and I attempted to serially dilate up. A 22 gauge angiocath was used to inject some methylene blue due < 1cc was able to be injected.    I reinserted the lacrimal probe. A circumareolar incision was made on the superior portion of the areola and carried down to the breast tissue. Flaps were created. I grasped the lacrimal probe with an allis clamp. I was able to excise the breast tissue and ductal system in its entirety. Behind the nipple complex, the duct was divided over the lacrimal probe, and the specimen was removed. A Suture marked the proximal point closest to the nipple.   Exparel was injected. Hemostasis was confirmed after irrigation.  3-0 Vicryl interrupted closed the breast tissue  flaps, leaving a space for seroma formation for cosmesis.  A running 4-0 Monocryl subcuticular was placed and dermabond.   Final inspection revealed acceptable hemostasis. All counts were correct at the end of the case. The patient was awakened from anesthesia and extubated without complication.  The patient went to the PACU in stable condition.   Algis Greenhouse, MD [facility] [address] [place], [place] [address] [phone] (office)

## 2021-04-03 NOTE — Interval H&P Note (Signed)
History and Physical Interval Note:  04/03/2021 8:34 AM  [name]  has presented today for surgery, with the diagnosis of Left bloody nipple discharge.  The various methods of treatment have been discussed with the patient and family. After consideration of risks, benefits and other options for treatment, the patient has consented to  Procedure(s): BREAST BIOPSY; excision of duct lesion (Left) as a surgical intervention.  The patient's history has been reviewed, patient examined, no change in status, stable for surgery.  I have reviewed the patient's chart and labs.  Questions were answered to the patient's satisfaction.    Marked. Discussed methylene blue and possible retroareola excision if no drainage today, discussed piercing and potential for burning.   Lucretia Roers

## 2021-04-03 NOTE — Transfer of Care (Signed)
Immediate Anesthesia Transfer of Care Note  Patient: [name]  Procedure(s) Performed: BREAST BIOPSY; excision of duct lesion (Left: Breast)  Patient Location: PACU  Anesthesia Type:General  Level of Consciousness: awake, alert  and oriented  Airway & Oxygen Therapy: Patient Spontanous Breathing and Patient connected to face mask oxygen  Post-op Assessment: Report given to RN, Post -op Vital signs reviewed and stable and Patient moving all extremities X 4  Post vital signs: Reviewed and stable  Last Vitals:  Vitals Value Taken Time  BP 155/80 04/03/21 0939  Temp    Pulse 90 04/03/21 0939  Resp 19 04/03/21 0940  SpO2 96 % 04/03/21 0939  Vitals shown include unvalidated device data.  Last Pain:  Vitals:   04/03/21 0731  TempSrc: Oral  PainSc:          Complications: No notable events documented.

## 2021-04-03 NOTE — Anesthesia Postprocedure Evaluation (Signed)
Anesthesia Post Note  Patient: [name]  Procedure(s) Performed: BREAST BIOPSY; excision of duct lesion (Left: Breast)  Patient location during evaluation: PACU Anesthesia Type: General Level of consciousness: awake and alert and oriented Pain management: pain level controlled Vital Signs Assessment: post-procedure vital signs reviewed and stable Respiratory status: spontaneous breathing and respiratory function stable Cardiovascular status: blood pressure returned to baseline and stable Postop Assessment: no apparent nausea or vomiting Anesthetic complications: no   No notable events documented.   Last Vitals:  Vitals:   04/03/21 1015 04/03/21 1023  BP: (!) 148/77 (!) 157/76  Pulse: 66 73  Resp: 16 18  Temp:  37.1 C  SpO2: 94% 94%    Last Pain:  Vitals:   04/03/21 1023  TempSrc: Oral  PainSc: 0-No pain                 Margo Lama C Teejay Meader

## 2021-04-03 NOTE — Progress Notes (Signed)
[facility]  Updated husband. Sent pain meds to pharmacy. Will see 7/5 and will call with results.   Algis Greenhouse, MD [facility] [address] [place], [place] [address] [phone] (office)

## 2021-04-03 NOTE — Anesthesia Preprocedure Evaluation (Signed)
Anesthesia Evaluation  Patient identified by MRN, date of birth, ID band Patient awake    Reviewed: Allergy & Precautions, NPO status , Patient's Chart, lab work & pertinent test results  History of Anesthesia Complications Negative for: history of anesthetic complications  Airway Mallampati: I  TM Distance: >3 FB Neck ROM: Full    Dental  (+) Edentulous Upper, Edentulous Lower   Pulmonary neg COPD,  COPD inhaler, former smoker,    Pulmonary exam normal breath sounds clear to auscultation       Cardiovascular negative cardio ROS Normal cardiovascular exam Rhythm:Regular Rate:Normal     Neuro/Psych PSYCHIATRIC DISORDERS Anxiety Depression    GI/Hepatic Neg liver ROS, GERD (mild)  Controlled,  Endo/Other  negative endocrine ROS  Renal/GU negative Renal ROS     Musculoskeletal  (+) Arthritis ,   Abdominal   Peds  Hematology   Anesthesia Other Findings   Reproductive/Obstetrics                            Anesthesia Physical Anesthesia Plan  ASA: 2  Anesthesia Plan: General   Post-op Pain Management:    Induction: Intravenous  PONV Risk Score and Plan: 4 or greater and Ondansetron, Dexamethasone and Midazolam  Airway Management Planned: LMA  Additional Equipment:   Intra-op Plan:   Post-operative Plan:   Informed Consent: I have reviewed the patients History and Physical, chart, labs and discussed the procedure including the risks, benefits and alternatives for the proposed anesthesia with the patient or authorized representative who has indicated his/her understanding and acceptance.       Plan Discussed with: CRNA and Surgeon  Anesthesia Plan Comments:        Anesthesia Quick Evaluation

## 2021-04-03 NOTE — Anesthesia Procedure Notes (Addendum)
Procedure Name: LMA Insertion Date/Time: 04/03/2021 8:45 AM Performed by: Julian Reil, CRNA Pre-anesthesia Checklist: Patient identified, Emergency Drugs available, Suction available and Patient being monitored Patient Re-evaluated:Patient Re-evaluated prior to induction Oxygen Delivery Method: Circle system utilized Preoxygenation: Pre-oxygenation with 100% oxygen Induction Type: IV induction LMA: LMA inserted LMA Size: 4.0 Tube type: Oral Number of attempts: 1 Placement Confirmation: positive ETCO2 Tube secured with: Tape Dental Injury: Teeth and Oropharynx as per pre-operative assessment

## 2021-04-03 NOTE — Discharge Instructions (Signed)
Discharge instructions after breast surgery:   Common Complaints: Pain and bruising at the incision sites.  Swelling at the incision sites.  Diet/ Activity: Diet as tolerated.  You may shower but do not take hot showers as this can disrupt the glue. Rest and listen to your body, but do not remain in bed all day.  Walk everyday for at least 15-20 minutes. Deep cough and move around every 1-2 hours in the first few days after surgery.  Do not lift > 10 lbs for the first 2 weeks after surgery. Do not do anything that makes you feel like you are putting unnecessary pull or stretch on the incision sites.  Do move your arm and shoulder. If you do not move then you can get stiff and hurt more.  Wear a bra if desired.  Do not pick at the dermabond glue on your incision sites.  This glue film will remain in place for 1-2 weeks and will start to peel off.  Do not place lotions or balms on your incision unless instructed to specifically by Dr. Henreitta Leber.   Pain Expectations and Narcotics: -After surgery you will have pain associated with your incisions and this is normal. The pain is muscular and nerve pain, and will get better with time. -You are encouraged and expected to take non narcotic medications like tylenol and ibuprofen (when able) to treat pain as multiple modalities can aid with pain treatment. -Narcotics are only used when pain is severe or there is breakthrough pain. -You are not expected to have a pain score of 0 after surgery, as we cannot prevent pain. A pain score of 3-4 that allows you to be functional, move, walk, and tolerate some activity is the goal. The pain will continue to improve over the days after surgery and is dependent on your surgery. -Due to [place] law, we are only able to give a certain amount of pain medication to treat post operative pain, and we only give additional narcotics on a patient by patient basis.  -Having appropriate expectations of pain and knowledge of pain  management with non narcotics is important as we do not want anyone to become addicted to narcotic pain medication.  -Using ice packs in the first 48 hours and heating pads after 48 hours, wearing an abdominal binder (when recommended), and using over the counter medications are all ways to help with pain management.   -Simple acts like meditation and mindfulness practices after surgery can also help with pain control and research has proven the benefit of these practices.  Medication: Take tylenol and ibuprofen as needed for pain control, alternating every 4-6 hours.  Example:  Tylenol 1000mg  @ 6am, 12noon, 6pm, (Do not exceed 4000mg  of tylenol a day). Ibuprofen 800mg  @ 9am, 3pm, 9pm, 3am (Do not exceed 3600mg  of ibuprofen a day).  Take Roxicodone for breakthrough pain every 4 hours.  Take Colace for constipation related to narcotic pain medication. If you do not have a bowel movement in 2 days, take Miralax over the counter.  Drink plenty of water to also prevent constipation.   Contact Information: If you have questions or concerns, please call our office, [phone], Monday- Thursday 8AM-5PM and Friday 8AM-12Noon.  If it is after hours or on the weekend, please call [facility] Main [facility], [phone], [phone], and ask to speak to the surgeon on call for Dr. Friday at [facility].

## 2021-04-04 ENCOUNTER — Encounter (HOSPITAL_COMMUNITY): Payer: Self-pay | Admitting: General Surgery

## 2021-04-05 ENCOUNTER — Telehealth (INDEPENDENT_AMBULATORY_CARE_PROVIDER_SITE_OTHER): Payer: Self-pay | Admitting: General Surgery

## 2021-04-05 DIAGNOSIS — N6452 Nipple discharge: Secondary | ICD-10-CM

## 2021-04-05 LAB — SURGICAL PATHOLOGY

## 2021-04-05 NOTE — Telephone Encounter (Signed)
[facility]  FINAL MICROSCOPIC DIAGNOSIS:   A. BREAST, LEFT, BIOPSY:  - Benign breast tissue with dilated ducts containing secretions.  - No evidence of malignancy.  Notified of results. No cancer. She had a nipple piercing in the past so unsure if that related versus on spectrum of ductal ectasia.   She reports she is doing well.  Algis Greenhouse, MD [facility] [address] [place], [place] [address] [phone] (office)

## 2021-04-16 ENCOUNTER — Ambulatory Visit (INDEPENDENT_AMBULATORY_CARE_PROVIDER_SITE_OTHER): Payer: Self-pay | Admitting: General Surgery

## 2021-04-16 ENCOUNTER — Encounter: Payer: Self-pay | Admitting: General Surgery

## 2021-04-16 ENCOUNTER — Other Ambulatory Visit: Payer: Self-pay

## 2021-04-16 VITALS — BP 126/83 | HR 72 | Temp 98.4°F | Resp 16 | Ht 66.0 in | Wt 266.0 lb

## 2021-04-16 DIAGNOSIS — N6452 Nipple discharge: Secondary | ICD-10-CM

## 2021-04-16 NOTE — Progress Notes (Signed)
[facility] Clinic Note   Doing well after her excisional biopsy of the left breast for nipple discharge. Healing incision. Dermabond in place, no erythema or drainage.  No further discharge.  Continue with normal mammograms. Call with questions or concerns.  Algis Greenhouse, MD [facility] [address] [place], [place] [address] [phone] (office)

## 2021-04-18 ENCOUNTER — Ambulatory Visit
Admission: EM | Admit: 2021-04-18 | Discharge: 2021-04-18 | Disposition: A | Discharge status: Home / Self Care | Payer: Self-pay | Attending: Emergency Medicine | Admitting: Emergency Medicine

## 2021-04-18 ENCOUNTER — Encounter: Payer: Self-pay | Admitting: Emergency Medicine

## 2021-04-18 DIAGNOSIS — H938X3 Other specified disorders of ear, bilateral: Secondary | ICD-10-CM

## 2021-04-18 DIAGNOSIS — R21 Rash and other nonspecific skin eruption: Secondary | ICD-10-CM

## 2021-04-18 MED ORDER — PREDNISONE 20 MG PO TABS: 20.0000 mg | ORAL_TABLET | Freq: Two times a day (BID) | ORAL | 0 refills | Status: AC

## 2021-04-18 NOTE — ED Provider Notes (Signed)
[facility]   [id number] 04/18/21 Arrival Time: 1051  CC:EAR PAIN; rash  SUBJECTIVE: History from: patient.  [name] is a 40 y.o. adult who presents with bilateral ear pain, pressure x 3 days.  Denies a precipitating event, such as swimming or wearing ear plugs.  Denies alleviating or aggravating factors.  Report similar symptoms in the past with ear infection.  Denies fever, chills, fatigue, sinus pain, rhinorrhea, ear discharge, sore throat, SOB, wheezing, chest pain, nausea, changes in bowel or bladder habits.    Patient also reports itchy rash to legs and wrist that occurred this morning.  No precipitating event, changes in medications, starting a new medication, changes in body wash detergent or close contacts with similar rash.  Denies alleviating factors.  Worse with scratching.  Denies similar symptoms.  Denies drainage, bleeding, genital manifestations  ROS: As per HPI.  All other pertinent ROS negative.     Past Medical History:  Diagnosis Date   Anxiety    Arthritis    Depression    Hypercholesteremia    Sinus infection 03/27/2021   Past Surgical History:  Procedure Laterality Date   BREAST BIOPSY Left 04/03/2021   Procedure: BREAST BIOPSY; excision of duct lesion;  Surgeon: Lucretia Roers, MD;  Location: [facility];  Service: General;  Laterality: Left;   CHOLECYSTECTOMY N/A 07/13/2014   Procedure: LAPAROSCOPIC CHOLECYSTECTOMY;  Surgeon: Marlane Hatcher, MD;  Location: [facility];  Service: General;  Laterality: N/A;   DENTAL SURGERY  2018   plate top & bottom   TUBAL LIGATION     No Known Allergies No current facility-administered medications on file prior to encounter.   Current Outpatient Medications on File Prior to Encounter  Medication Sig Dispense Refill   albuterol (VENTOLIN HFA) 108 (90 Base) MCG/ACT inhaler Inhale 2 puffs into the lungs every 6 (six) hours as needed for wheezing or shortness of breath. (Patient not taking: Reported on  04/16/2021) 8 g 1   benzonatate (TESSALON) 100 MG capsule Take 1 capsule (100 mg total) by mouth every 8 (eight) hours. 21 capsule 0   citalopram (CELEXA) 40 MG tablet Take 1 tablet by mouth once daily (Patient taking differently: Take 40 mg by mouth daily.) 30 tablet 3   fenofibrate (TRICOR) 145 MG tablet Take 1 tablet (145 mg total) by mouth daily. 30 tablet 6   IRON PO Take 1 tablet by mouth daily.     JENCYCLA 0.35 MG tablet Take 1 tablet by mouth once daily (Patient taking differently: Take 1 tablet by mouth daily.) 84 tablet 3   megestrol (MEGACE) 40 MG tablet TAKE 1 TABLET BY MOUTH THREE TIMES DAILY UNTIL  BLEEDING  STOPS,  THEN  TAKE  1  TABLET  ONCE  DAILY  UNTIL  FOLLOW  UP. (Patient taking differently: Take 40 mg by mouth daily.) 45 tablet 3   meloxicam (MOBIC) 15 MG tablet TAKE 1 TABLET BY MOUTH ONCE DAILY AS NEEDED FOR PAIN (Patient taking differently: Take 15 mg by mouth daily.) 30 tablet 0   ondansetron (ZOFRAN) 4 MG tablet Take 1 tablet (4 mg total) by mouth every 8 (eight) hours as needed. (Patient not taking: Reported on 04/16/2021) 30 tablet 1   oxyCODONE (ROXICODONE) 5 MG immediate release tablet Take 1 tablet (5 mg total) by mouth every 4 (four) hours as needed for severe pain or breakthrough pain. (Patient not taking: Reported on 04/16/2021) 10 tablet 0   simvastatin (ZOCOR) 40 MG tablet Take 1 tablet (  40 mg total) by mouth at bedtime. 30 tablet 6   Social History   Socioeconomic History   Marital status: Married    Spouse name: Not on file   Number of children: 2   Years of education: Not on file   Highest education level: Not on file  Occupational History   Not on file  Tobacco Use   Smoking status: Former    Packs/day: 0.50    Years: 15.00    Pack years: 7.50    Types: Cigarettes    Quit date: 11/14/2019    Years since quitting: 1.4   Smokeless tobacco: Never  Vaping Use   Vaping Use: Never used  Substance and Sexual Activity   Alcohol use: No   Drug use: No    Sexual activity: Yes    Birth control/protection: Surgical, Pill  Other Topics Concern   Not on file  Social History Narrative   Not on file   Social Determinants of Health   Financial Resource Strain: Not on file  Food Insecurity: Food Insecurity Present   Worried About Programme researcher, broadcasting/film/video in the Last Year: Often true   Barista in the Last Year: Never true  Transportation Needs: Not on file  Physical Activity: Not on file  Stress: Not on file  Social Connections: Not on file  Intimate Partner Violence: Not on file   Family History  Problem Relation Age of Onset   Heart disease Mother    Diabetes Mother    Heart disease Father    Hypertension Father    Thyroid disease Father    Hyperlipidemia Father    Breast cancer Paternal Aunt    Cancer - Colon Paternal Uncle    Breast cancer Paternal Grandmother     OBJECTIVE:  Vitals:   04/18/21 1140  BP: 119/76  Pulse: 83  Resp: 18  Temp: 99.3 F (37.4 C)  TempSrc: Oral  SpO2: 96%    General appearance: alert; well-appearing, nontoxic; speaking in full sentences and tolerating own secretions HEENT: NCAT; Ears: EACs clear, TMs pearly gray; Eyes: PERRL.  EOM grossly intact.Nose: nares patent without rhinorrhea, Throat: oropharynx clear, tonsils non erythematous or enlarged, uvula midline  Neck: supple without LAD Lungs: unlabored respirations, symmetrical air entry; cough: absent; no respiratory distress; CTAB Heart: regular rate and rhythm.  Skin: warm and dry; small erythematous papular rash to Les, particularly medial thighs, with blanching of the circumference, NTTP, no obvious drainage or bleeding Psychological: alert and cooperative; normal mood and affect    ASSESSMENT & PLAN:  1. Ear pressure, bilateral   2. Rash and nonspecific skin eruption     Meds ordered this encounter  Medications   predniSONE (DELTASONE) 20 MG tablet    Sig: Take 1 tablet (20 mg total) by mouth 2 (two) times daily with a meal  for 5 days.    Dispense:  10 tablet    Refill:  0    Order Specific Question:   Supervising Provider    Answer:   Eustace Moore [[id number]]     Rest and drink plenty of fluids We will trial a course of prednisone for eustachian tube dysfunction and itchy rash May incorporate OTC zyrtec and flonase as well Take medications as directed and to completion Continue to use OTC ibuprofen and/ or tylenol as needed for pain control Follow up with ENT if ear discomfort persists Return here or go to the ER if you have any new or worsening  symptoms fever, chills, nausea, vomiting, drainage, hearing changes, etc....  Reviewed expectations re: course of current medical issues. Questions answered. Outlined signs and symptoms indicating need for more acute intervention. Patient verbalized understanding. After Visit Summary given.          Rennis Harding, PA-C 04/18/21 1153

## 2021-04-18 NOTE — ED Triage Notes (Addendum)
Bilateral ear pain more on the RT side since Monday.  Also reports itchy rash on legs and LT arm that started this morning.

## 2021-04-18 NOTE — Discharge Instructions (Addendum)
Rest and drink plenty of fluids We will trial a course of prednisone for eustachian tube dysfunction and itchy rash May incorporate OTC zyrtec and flonase as well Take medications as directed and to completion Continue to use OTC ibuprofen and/ or tylenol as needed for pain control Follow up with ENT if ear discomfort persists Return here or go to the ER if you have any new or worsening symptoms fever, chills, nausea, vomiting, drainage, hearing changes, etc..Marland KitchenMarland Kitchen

## 2021-04-24 ENCOUNTER — Other Ambulatory Visit: Payer: Self-pay | Admitting: Physician Assistant

## 2021-04-24 DIAGNOSIS — R7989 Other specified abnormal findings of blood chemistry: Secondary | ICD-10-CM

## 2021-04-24 DIAGNOSIS — E785 Hyperlipidemia, unspecified: Secondary | ICD-10-CM

## 2021-04-26 ENCOUNTER — Other Ambulatory Visit: Payer: Self-pay | Admitting: Physician Assistant

## 2021-05-01 ENCOUNTER — Other Ambulatory Visit: Payer: Self-pay | Admitting: Physician Assistant

## 2021-05-01 MED ORDER — FENOFIBRATE 145 MG PO TABS: 145.0000 mg | ORAL_TABLET | Freq: Every day | ORAL | 6 refills | Status: DC

## 2021-05-02 ENCOUNTER — Other Ambulatory Visit (HOSPITAL_COMMUNITY)
Admission: RE | Admit: 2021-05-02 | Discharge: 2021-05-02 | Disposition: A | Discharge status: Home / Self Care | Payer: Self-pay | Source: Ambulatory Visit | Attending: Physician Assistant | Admitting: Physician Assistant

## 2021-05-02 ENCOUNTER — Other Ambulatory Visit: Payer: Self-pay

## 2021-05-02 DIAGNOSIS — E785 Hyperlipidemia, unspecified: Secondary | ICD-10-CM | POA: Insufficient documentation

## 2021-05-02 DIAGNOSIS — R7989 Other specified abnormal findings of blood chemistry: Secondary | ICD-10-CM | POA: Insufficient documentation

## 2021-05-02 LAB — COMPREHENSIVE METABOLIC PANEL
ALT: 59 U/L — ABNORMAL HIGH (ref 0–44)
AST: 47 U/L — ABNORMAL HIGH (ref 15–41)
Albumin: 4.1 g/dL (ref 3.5–5.0)
Alkaline Phosphatase: 53 U/L (ref 38–126)
Anion gap: 8 (ref 5–15)
BUN: 11 mg/dL (ref 6–20)
CO2: 22 mmol/L (ref 22–32)
Calcium: 9.3 mg/dL (ref 8.9–10.3)
Chloride: 104 mmol/L (ref 98–111)
Creatinine, Ser: 0.91 mg/dL (ref 0.44–1.00)
GFR, Estimated: >60 mL/min (ref >60)
Glucose, Bld: 152 mg/dL — ABNORMAL HIGH (ref 70–99)
Potassium: 3.9 mmol/L (ref 3.5–5.1)
Sodium: 134 mmol/L — ABNORMAL LOW (ref 135–145)
Total Bilirubin: 0.6 mg/dL (ref 0.3–1.2)
Total Protein: 7.1 g/dL (ref 6.5–8.1)

## 2021-05-02 LAB — LIPID PANEL
Cholesterol: 177 mg/dL (ref 0–200)
HDL: 32 mg/dL — ABNORMAL LOW (ref >40)
LDL Cholesterol: 113 mg/dL — ABNORMAL HIGH (ref 0–99)
Total CHOL/HDL Ratio: 5.5 RATIO
Triglycerides: 162 mg/dL — ABNORMAL HIGH (ref <150)
VLDL: 32 mg/dL (ref 0–40)

## 2021-05-07 ENCOUNTER — Other Ambulatory Visit (HOSPITAL_COMMUNITY)
Admission: RE | Admit: 2021-05-07 | Discharge: 2021-05-07 | Disposition: A | Discharge status: Home / Self Care | Payer: Self-pay | Source: Ambulatory Visit | Attending: Physician Assistant | Admitting: Physician Assistant

## 2021-05-07 ENCOUNTER — Encounter: Payer: Self-pay | Admitting: Physician Assistant

## 2021-05-07 ENCOUNTER — Ambulatory Visit: Payer: Self-pay | Admitting: Physician Assistant

## 2021-05-07 VITALS — BP 120/64 | HR 62 | Temp 97.8°F | Wt 267.0 lb

## 2021-05-07 DIAGNOSIS — Z124 Encounter for screening for malignant neoplasm of cervix: Secondary | ICD-10-CM

## 2021-05-07 DIAGNOSIS — K219 Gastro-esophageal reflux disease without esophagitis: Secondary | ICD-10-CM

## 2021-05-07 DIAGNOSIS — E785 Hyperlipidemia, unspecified: Secondary | ICD-10-CM

## 2021-05-07 DIAGNOSIS — F419 Anxiety disorder, unspecified: Secondary | ICD-10-CM

## 2021-05-07 DIAGNOSIS — R7989 Other specified abnormal findings of blood chemistry: Secondary | ICD-10-CM

## 2021-05-07 MED ORDER — PANTOPRAZOLE SODIUM 40 MG PO TBEC
40.0000 mg | DELAYED_RELEASE_TABLET | Freq: Every day | ORAL | 6 refills | Status: DC
Start: 2021-05-07 — End: 2023-03-05

## 2021-05-07 NOTE — Progress Notes (Signed)
BP 120/64   Pulse 62   Temp 97.8 F (36.6 C)   Wt 267 lb (121.1 kg)   SpO2 97%   BMI 43.09 kg/m    Subjective:    Patient ID: [name], adult    DOB: 09/26/1981, 40 y.o.   MRN: [id number]  HPI: ADARIA HOLE is a 40 y.o. adult presenting on 05/07/2021 for Gynecologic Exam, Hyperlipidemia, and mood   HPI  Pt had a negative covid 19 screening questionnaire.    Chief Complaint  Patient presents with   Gynecologic Exam   Hyperlipidemia   mood     Pt C/o reflux recently .  OTCs aren't helping  She says her mood has been fine.  She reports no issues except for the reflux.    Relevant past medical, surgical, family and social history reviewed and updated as indicated. Interim medical history since our last visit reviewed. Allergies and medications reviewed and updated.   Current Outpatient Medications:    albuterol (VENTOLIN HFA) 108 (90 Base) MCG/ACT inhaler, Inhale 2 puffs into the lungs every 6 (six) hours as needed for wheezing or shortness of breath., Disp: 8 g, Rfl: 1   citalopram (CELEXA) 40 MG tablet, Take 1 tablet by mouth once daily (Patient taking differently: Take 40 mg by mouth daily.), Disp: 30 tablet, Rfl: 3   fenofibrate (TRICOR) 145 MG tablet, Take 1 tablet (145 mg total) by mouth daily., Disp: 30 tablet, Rfl: 6   IRON PO, Take 1 tablet by mouth daily., Disp: , Rfl:    JENCYCLA 0.35 MG tablet, Take 1 tablet by mouth once daily (Patient taking differently: Take 1 tablet by mouth daily.), Disp: 84 tablet, Rfl: 3   megestrol (MEGACE) 40 MG tablet, TAKE 1 TABLET BY MOUTH THREE TIMES DAILY UNTIL  BLEEDING  STOPS,  THEN  TAKE  1  TABLET  ONCE  DAILY  UNTIL  FOLLOW  UP. (Patient taking differently: Take 40 mg by mouth daily.), Disp: 45 tablet, Rfl: 3   meloxicam (MOBIC) 15 MG tablet, TAKE 1 TABLET BY MOUTH ONCE DAILY AS NEEDED FOR PAIN, Disp: 30 tablet, Rfl: 0   pantoprazole (PROTONIX) 40 MG tablet, Take 1 tablet (40 mg total) by mouth daily., Disp: 30 tablet,  Rfl: 6   simvastatin (ZOCOR) 40 MG tablet, Take 1 tablet (40 mg total) by mouth at bedtime., Disp: 30 tablet, Rfl: 6    Review of Systems  Per HPI unless specifically indicated above     Objective:    BP 120/64   Pulse 62   Temp 97.8 F (36.6 C)   Wt 267 lb (121.1 kg)   SpO2 97%   BMI 43.09 kg/m   Wt Readings from Last 3 Encounters:  05/07/21 267 lb (121.1 kg)  04/16/21 266 lb (120.7 kg)  04/03/21 230 lb (104.3 kg)    Physical Exam Vitals and nursing note reviewed.  Constitutional:      General: She is not in acute distress.    Appearance: She is well-developed. She is not ill-appearing.  HENT:     Head: Normocephalic and atraumatic.  Cardiovascular:     Rate and Rhythm: Normal rate and regular rhythm.  Pulmonary:     Effort: Pulmonary effort is normal.     Breath sounds: Normal breath sounds.  Chest:  Breasts:    Right: Normal.     Left: Tenderness present. No mass or nipple discharge.     Comments: Left breast with mild tenderness due to  recent biopsy.  Wound healing without redness or discharge Abdominal:     General: Bowel sounds are normal.     Palpations: Abdomen is soft. There is no mass.     Tenderness: There is no abdominal tenderness. There is no guarding or rebound.  Genitourinary:    Labia:        Right: No rash, tenderness or lesion.        Left: No rash, tenderness or lesion.      Vagina: Normal.     Cervix: No cervical motion tenderness, discharge or friability.     Adnexa:        Right: No mass, tenderness or fullness.         Left: No mass, tenderness or fullness.       Comments: Enrique Sack cma assisted)  Musculoskeletal:     Cervical back: Neck supple.     Right lower leg: No edema.     Left lower leg: No edema.  Lymphadenopathy:     Cervical: No cervical adenopathy.  Skin:    General: Skin is warm and dry.  Neurological:     Mental Status: She is alert and oriented to person, place, and time.  Psychiatric:        Behavior: Behavior  normal.    Results for orders placed or performed during the hospital encounter of 05/02/21  Lipid panel  Result Value Ref Range   Cholesterol 177 0 - 200 mg/dL   Triglycerides 970 (H) <150 mg/dL   HDL 32 (L) >26 mg/dL   Total CHOL/HDL Ratio 5.5 RATIO   VLDL 32 0 - 40 mg/dL   LDL Cholesterol 378 (H) 0 - 99 mg/dL  Comprehensive metabolic panel  Result Value Ref Range   Sodium 134 (L) 135 - 145 mmol/L   Potassium 3.9 3.5 - 5.1 mmol/L   Chloride 104 98 - 111 mmol/L   CO2 22 22 - 32 mmol/L   Glucose, Bld 152 (H) 70 - 99 mg/dL   BUN 11 6 - 20 mg/dL   Creatinine, Ser 5.88 0.44 - 1.00 mg/dL   Calcium 9.3 8.9 - 50.2 mg/dL   Total Protein 7.1 6.5 - 8.1 g/dL   Albumin 4.1 3.5 - 5.0 g/dL   AST 47 (H) 15 - 41 U/L   ALT 59 (H) 0 - 44 U/L   Alkaline Phosphatase 53 38 - 126 U/L   Total Bilirubin 0.6 0.3 - 1.2 mg/dL   GFR, Estimated >77 >41 mL/min   Anion gap 8 5 - 15      Assessment & Plan:    Encounter Diagnoses  Name Primary?   Routine Papanicolaou smear Yes   Gastroesophageal reflux disease, unspecified whether esophagitis present    Hyperlipidemia, unspecified hyperlipidemia type    Elevated LFTs    Anxiety    Morbid obesity (HCC)      -reviewed labs with pt  -Add protonix.  Counseled on lifestyle changes for gerd and gave readiing information -encouraged healthy diet and regular exercise for weight -educated and encouraged pt to get covid vaccination -pt to continue cholesterol meds -pt to follow up 3 months.  Will Check a1c prior to next appointment.  Pt to contact office sooner prn

## 2021-05-09 LAB — CYTOLOGY - PAP
Adequacy: ABSENT
Comment: NEGATIVE
Diagnosis: NEGATIVE
High risk HPV: NEGATIVE

## 2021-05-27 ENCOUNTER — Other Ambulatory Visit: Payer: Self-pay | Admitting: Physician Assistant

## 2021-05-27 MED ORDER — SIMVASTATIN 40 MG PO TABS: 40.0000 mg | ORAL_TABLET | Freq: Every day | ORAL | 6 refills | Status: DC

## 2021-06-02 ENCOUNTER — Other Ambulatory Visit: Payer: Self-pay | Admitting: Physician Assistant

## 2021-06-03 ENCOUNTER — Other Ambulatory Visit: Payer: Self-pay

## 2021-06-06 ENCOUNTER — Other Ambulatory Visit: Payer: Self-pay

## 2021-06-06 MED ORDER — NORETHINDRONE 0.35 MG PO TABS: 1.0000 | ORAL_TABLET | Freq: Every day | ORAL | 3 refills | Status: DC

## 2021-06-20 ENCOUNTER — Ambulatory Visit
Admission: EM | Admit: 2021-06-20 | Discharge: 2021-06-20 | Disposition: A | Discharge status: Home / Self Care | Payer: Self-pay | Attending: Emergency Medicine | Admitting: Emergency Medicine

## 2021-06-20 ENCOUNTER — Other Ambulatory Visit: Payer: Self-pay

## 2021-06-20 DIAGNOSIS — S29012A Strain of muscle and tendon of back wall of thorax, initial encounter: Secondary | ICD-10-CM

## 2021-06-20 DIAGNOSIS — M25511 Pain in right shoulder: Secondary | ICD-10-CM

## 2021-06-20 DIAGNOSIS — M62830 Muscle spasm of back: Secondary | ICD-10-CM

## 2021-06-20 MED ORDER — CYCLOBENZAPRINE HCL 10 MG PO TABS: 10.0000 mg | ORAL_TABLET | Freq: Two times a day (BID) | ORAL | 0 refills | Status: DC | PRN

## 2021-06-20 MED ORDER — PREDNISONE 10 MG (21) PO TBPK: ORAL_TABLET | Freq: Every day | ORAL | 0 refills | Status: DC

## 2021-06-20 MED ORDER — PREDNISONE 20 MG PO TABS
20.0000 mg | ORAL_TABLET | Freq: Once | ORAL | Status: AC
  Administered 2021-06-20: 20 mg via ORAL

## 2021-06-20 NOTE — ED Provider Notes (Signed)
[facility]   [id number] 06/20/21 Arrival Time: 1920  CC: RT shoulder PAIN  SUBJECTIVE: History from: patient. [name] is a 40 y.o. adult complains of RT shoulder pain x 4 days ago.  Denies a precipitating event or specific injury.  Localizes the pain to the behind RT shoulder blade.  Describes the pain as constant 9/10.  Has tried OTC medications without relief.  Symptoms are made worse with movement of the shoulder.  Denies similar symptoms in the past.  Denies fever, chills, erythema, ecchymosis, effusion, weakness, numbness and tingling.   ROS: As per HPI.  All other pertinent ROS negative.     Past Medical History:  Diagnosis Date   Anxiety    Arthritis    Depression    Hypercholesteremia    Sinus infection 03/27/2021   Past Surgical History:  Procedure Laterality Date   BREAST BIOPSY Left 04/03/2021   Procedure: BREAST BIOPSY; excision of duct lesion;  Surgeon: Lucretia Roers, MD;  Location: [facility];  Service: General;  Laterality: Left;   CHOLECYSTECTOMY N/A 07/13/2014   Procedure: LAPAROSCOPIC CHOLECYSTECTOMY;  Surgeon: Marlane Hatcher, MD;  Location: [facility];  Service: General;  Laterality: N/A;   DENTAL SURGERY  2018   plate top & bottom   TUBAL LIGATION     No Known Allergies No current facility-administered medications on file prior to encounter.   Current Outpatient Medications on File Prior to Encounter  Medication Sig Dispense Refill   albuterol (VENTOLIN HFA) 108 (90 Base) MCG/ACT inhaler Inhale 2 puffs into the lungs every 6 (six) hours as needed for wheezing or shortness of breath. 8 g 1   citalopram (CELEXA) 40 MG tablet Take 1 tablet by mouth once daily 30 tablet 4   fenofibrate (TRICOR) 145 MG tablet Take 1 tablet (145 mg total) by mouth daily. 30 tablet 6   IRON PO Take 1 tablet by mouth daily.     megestrol (MEGACE) 40 MG tablet TAKE 1 TABLET BY MOUTH THREE TIMES DAILY UNTIL  BLEEDING  STOPS,  THEN  TAKE  1  TABLET  ONCE  DAILY  UNTIL   FOLLOW  UP. (Patient taking differently: Take 40 mg by mouth daily.) 45 tablet 3   meloxicam (MOBIC) 15 MG tablet TAKE 1 TABLET BY MOUTH ONCE DAILY AS NEEDED FOR PAIN 30 tablet 0   norethindrone (JENCYCLA) 0.35 MG tablet Take 1 tablet (0.35 mg total) by mouth daily. 84 tablet 3   pantoprazole (PROTONIX) 40 MG tablet Take 1 tablet (40 mg total) by mouth daily. 30 tablet 6   simvastatin (ZOCOR) 40 MG tablet Take 1 tablet (40 mg total) by mouth at bedtime. 30 tablet 6   Social History   Socioeconomic History   Marital status: Married    Spouse name: Not on file   Number of children: 2   Years of education: Not on file   Highest education level: Not on file  Occupational History   Not on file  Tobacco Use   Smoking status: Former    Packs/day: 0.50    Years: 15.00    Pack years: 7.50    Types: Cigarettes    Quit date: 11/14/2019    Years since quitting: 1.6   Smokeless tobacco: Never  Vaping Use   Vaping Use: Never used  Substance and Sexual Activity   Alcohol use: No   Drug use: No   Sexual activity: Yes    Birth control/protection: Surgical, Pill  Other Topics Concern  Not on file  Social History Narrative   Not on file   Social Determinants of Health   Financial Resource Strain: Not on file  Food Insecurity: Food Insecurity Present   Worried About Programme researcher, broadcasting/film/video in the Last Year: Often true   Barista in the Last Year: Never true  Transportation Needs: Not on file  Physical Activity: Not on file  Stress: Not on file  Social Connections: Not on file  Intimate Partner Violence: Not on file   Family History  Problem Relation Age of Onset   Heart disease Mother    Diabetes Mother    Heart disease Father    Hypertension Father    Thyroid disease Father    Hyperlipidemia Father    Breast cancer Paternal Aunt    Cancer - Colon Paternal Uncle    Breast cancer Paternal Grandmother     OBJECTIVE:  Vitals:   06/20/21 1925  BP: 127/78  Pulse: 72   Resp: 18  Temp: 98.4 F (36.9 C)  TempSrc: Oral  SpO2: 98%    General appearance: ALERT; in no acute distress.  Head: NCAT Lungs: Normal respiratory effort Musculoskeletal: RT shoulder Inspection: Skin warm, dry, clear and intact without obvious erythema, effusion, or ecchymosis.  Palpation: TTP over medial aspect of RT scapula, thoracic paravertebral muscles ROM: LROM Strength: deferred Skin: warm and dry Neurologic: Ambulates without difficulty; Sensation intact about the upper extremities Psychological: alert and cooperative; normal mood and affect  ASSESSMENT & PLAN:  1. Acute pain of right shoulder   2. Strain of thoracic back region   3. Spasm of thoracic back muscle     Meds ordered this encounter  Medications   predniSONE (DELTASONE) tablet 20 mg   predniSONE (STERAPRED UNI-PAK 21 TAB) 10 MG (21) TBPK tablet    Sig: Take by mouth daily. Take 6 tabs by mouth daily  for 2 days, then 5 tabs for 2 days, then 4 tabs for 2 days, then 3 tabs for 2 days, 2 tabs for 2 days, then 1 tab by mouth daily for 2 days    Dispense:  42 tablet    Refill:  0    Order Specific Question:   Supervising Provider    Answer:   Eustace Moore [[id number]]   cyclobenzaprine (FLEXERIL) 10 MG tablet    Sig: Take 1 tablet (10 mg total) by mouth 2 (two) times daily as needed for muscle spasms.    Dispense:  20 tablet    Refill:  0    Order Specific Question:   Supervising Provider    Answer:   Eustace Moore [[id number]]   Prednisone given in office Continue conservative management of rest, ice, and gentle stretches Prednisone prescribed Take cyclobenzaprine at nighttime for symptomatic relief. Avoid driving or operating heavy machinery while using medication. Follow up with PCP if symptoms persist Return or go to the ER if you have any new or worsening symptoms (fever, chills, chest pain, shortness of breath, bruising, redness, deformity, etc...)   Reviewed expectations re: course of  current medical issues. Questions answered. Outlined signs and symptoms indicating need for more acute intervention. Patient verbalized understanding. After Visit Summary given.     Rennis Harding, PA-C 06/20/21 1935

## 2021-06-20 NOTE — ED Triage Notes (Signed)
Pain under right shoulder blade x 4 days.  States the more she moves her arm, the more it hurts.

## 2021-06-20 NOTE — Discharge Instructions (Signed)
Prednisone given in office Continue conservative management of rest, ice, and gentle stretches Prednisone prescribed Take cyclobenzaprine at nighttime for symptomatic relief. Avoid driving or operating heavy machinery while using medication. Follow up with PCP if symptoms persist Return or go to the ER if you have any new or worsening symptoms (fever, chills, chest pain, shortness of breath, bruising, redness, deformity, etc...)

## 2021-07-02 ENCOUNTER — Other Ambulatory Visit: Payer: Self-pay | Admitting: Physician Assistant

## 2021-07-08 ENCOUNTER — Telehealth: Payer: Self-pay | Admitting: Physician Assistant

## 2021-07-08 NOTE — Telephone Encounter (Signed)
Patient called stating she was having facial numbness and it was coming and going over past two weeks. After speaking with PA patient was advised to go to ER for treatment.

## 2021-07-10 ENCOUNTER — Other Ambulatory Visit: Payer: Self-pay | Admitting: Physician Assistant

## 2021-07-10 ENCOUNTER — Telehealth: Payer: Self-pay | Admitting: Physician Assistant

## 2021-07-10 MED ORDER — MELOXICAM 15 MG PO TABS: 15.0000 mg | ORAL_TABLET | Freq: Every day | ORAL | 0 refills | Status: DC | PRN

## 2021-07-10 NOTE — Telephone Encounter (Signed)
Called pt to check on her after she called office 9/26 when provider was out of office.  Pt was recommended to go to ER.  She did not.  She says that she is no longer having the facial numbness so she didn't go to ER.  She says she feels well.

## 2021-07-23 ENCOUNTER — Other Ambulatory Visit: Payer: Self-pay | Admitting: Physician Assistant

## 2021-07-23 DIAGNOSIS — R7309 Other abnormal glucose: Secondary | ICD-10-CM

## 2021-07-23 DIAGNOSIS — E785 Hyperlipidemia, unspecified: Secondary | ICD-10-CM

## 2021-07-23 DIAGNOSIS — Z131 Encounter for screening for diabetes mellitus: Secondary | ICD-10-CM

## 2021-07-23 DIAGNOSIS — R7989 Other specified abnormal findings of blood chemistry: Secondary | ICD-10-CM

## 2021-07-26 ENCOUNTER — Other Ambulatory Visit: Payer: Self-pay | Admitting: Obstetrics & Gynecology

## 2021-08-07 ENCOUNTER — Ambulatory Visit: Payer: Self-pay | Admitting: Physician Assistant

## 2021-08-14 ENCOUNTER — Ambulatory Visit: Payer: Self-pay | Admitting: Physician Assistant

## 2021-08-25 ENCOUNTER — Other Ambulatory Visit: Payer: Self-pay | Admitting: Physician Assistant

## 2021-08-26 ENCOUNTER — Other Ambulatory Visit: Payer: Self-pay

## 2021-08-26 ENCOUNTER — Ambulatory Visit
Admission: EM | Admit: 2021-08-26 | Discharge: 2021-08-26 | Disposition: A | Discharge status: Home / Self Care | Payer: Self-pay | Attending: Family Medicine | Admitting: Family Medicine

## 2021-08-26 DIAGNOSIS — Z20828 Contact with and (suspected) exposure to other viral communicable diseases: Secondary | ICD-10-CM

## 2021-08-26 DIAGNOSIS — J069 Acute upper respiratory infection, unspecified: Secondary | ICD-10-CM

## 2021-08-26 MED ORDER — PROMETHAZINE-DM 6.25-15 MG/5ML PO SYRP: 5.0000 mL | ORAL_SOLUTION | Freq: Four times a day (QID) | ORAL | 0 refills | Status: DC | PRN

## 2021-08-26 MED ORDER — OSELTAMIVIR PHOSPHATE 75 MG PO CAPS: 75.0000 mg | ORAL_CAPSULE | Freq: Two times a day (BID) | ORAL | 0 refills | Status: DC

## 2021-08-26 NOTE — ED Triage Notes (Signed)
Patient states she has body aches, headaches, congestion, snotty nose and a progressive cough for 2 days. She would like to be tested for covid and Flu.  Denies Meds. Denies Fever

## 2021-08-26 NOTE — ED Provider Notes (Signed)
[facility]    CSN: [id number] Arrival date & time: 08/26/21  1310      History   Chief Complaint No chief complaint on file.   HPI [name] is a 40 y.o. adult.   Presenting today for 2-day history of body aches, headaches, congestion, productive cough, fatigue.  Denies chest pain, shortness of breath, abdominal pain, nausea vomiting or diarrhea.  Multiple household members with influenza recently.  Requesting to be tested for COVID and flu.  Has not tried any medication so far for symptoms.  No known fevers that she is aware of.  No past history of pertinent medical problems.   Past Medical History:  Diagnosis Date   Anxiety    Arthritis    Depression    Hypercholesteremia    Sinus infection 03/27/2021    Patient Active Problem List   Diagnosis Date Noted   Bloody discharge from left nipple 03/21/2021   Right hip and buttock pain, ishial bursitis 07/20/2020   Anxiety 03/29/2019   Contact dermatitis 03/29/2019   Dysmenorrhea 06/30/2018   Hyperlipidemia 09/27/2015   Obesity, unspecified 09/27/2015   Cigarette nicotine dependence, uncomplicated 09/27/2015   Cholecystitis with cholelithiasis 07/13/2014    Past Surgical History:  Procedure Laterality Date   BREAST BIOPSY Left 04/03/2021   Procedure: BREAST BIOPSY; excision of duct lesion;  Surgeon: Lucretia Roers, MD;  Location: [facility];  Service: General;  Laterality: Left;   CHOLECYSTECTOMY N/A 07/13/2014   Procedure: LAPAROSCOPIC CHOLECYSTECTOMY;  Surgeon: Marlane Hatcher, MD;  Location: [facility];  Service: General;  Laterality: N/A;   DENTAL SURGERY  2018   plate top & bottom   TUBAL LIGATION      OB History     Gravida  2   Para  2   Term      Preterm      AB      Living  2      SAB      IAB      Ectopic      Multiple      Live Births               Home Medications    Prior to Admission medications   Medication Sig Start Date End Date Taking? Authorizing  Provider  oseltamivir (TAMIFLU) 75 MG capsule Take 1 capsule (75 mg total) by mouth every 12 (twelve) hours. 08/26/21  Yes Particia Nearing, PA-C  promethazine-dextromethorphan (PROMETHAZINE-DM) 6.25-15 MG/5ML syrup Take 5 mLs by mouth 4 (four) times daily as needed for cough. 08/26/21  Yes Particia Nearing, PA-C  albuterol (VENTOLIN HFA) 108 (90 Base) MCG/ACT inhaler Inhale 2 puffs into the lungs every 6 (six) hours as needed for wheezing or shortness of breath. 10/17/20   Jacquelin Hawking, PA-C  citalopram (CELEXA) 40 MG tablet Take 1 tablet by mouth once daily 06/03/21   Jacquelin Hawking, PA-C  cyclobenzaprine (FLEXERIL) 10 MG tablet Take 1 tablet (10 mg total) by mouth 2 (two) times daily as needed for muscle spasms. 06/20/21   Wurst, [place], PA-C  fenofibrate (TRICOR) 145 MG tablet Take 1 tablet (145 mg total) by mouth daily. 05/01/21   Jacquelin Hawking, PA-C  IRON PO Take 1 tablet by mouth daily.    [provider]  megestrol (MEGACE) 40 MG tablet TAKE 1 TABLET BY MOUTH THREE TIMES DAILY UNTIL BLEEDING STOPS, THEN TAKE 1 TABLET ONCE DAILY UNTIL FOLLOW UP 07/26/21   Lazaro Arms, MD  meloxicam (  MOBIC) 15 MG tablet TAKE 1 TABLET BY MOUTH ONCE DAILY AS NEEDED FOR PAIN 08/26/21   Soyla Dryer, PA-C  norethindrone (JENCYCLA) 0.35 MG tablet Take 1 tablet (0.35 mg total) by mouth daily. 06/06/21   Cresenzo-Dishmon, Joaquim Lai, CNM  pantoprazole (PROTONIX) 40 MG tablet Take 1 tablet (40 mg total) by mouth daily. 05/07/21   Soyla Dryer, PA-C  predniSONE (STERAPRED UNI-PAK 21 TAB) 10 MG (21) TBPK tablet Take by mouth daily. Take 6 tabs by mouth daily  for 2 days, then 5 tabs for 2 days, then 4 tabs for 2 days, then 3 tabs for 2 days, 2 tabs for 2 days, then 1 tab by mouth daily for 2 days 06/20/21   Stacey Drain, [place], PA-C  simvastatin (ZOCOR) 40 MG tablet Take 1 tablet (40 mg total) by mouth at bedtime. 05/27/21   Soyla Dryer, PA-C    Family History Family History  Problem  Relation Age of Onset   Heart disease Mother    Diabetes Mother    Heart disease Father    Hypertension Father    Thyroid disease Father    Hyperlipidemia Father    Breast cancer Paternal Aunt    Cancer - Colon Paternal Uncle    Breast cancer Paternal Grandmother     Social History Social History   Tobacco Use   Smoking status: Former    Packs/day: 0.50    Years: 15.00    Pack years: 7.50    Types: Cigarettes    Quit date: 11/14/2019    Years since quitting: 1.7   Smokeless tobacco: Never  Vaping Use   Vaping Use: Never used  Substance Use Topics   Alcohol use: No   Drug use: No     Allergies   Patient has no known allergies.   Review of Systems Review of Systems Per HPI  Physical Exam Triage Vital Signs ED Triage Vitals  Enc Vitals Group     BP 08/26/21 1536 126/82     Pulse Rate 08/26/21 1536 74     Resp 08/26/21 1536 18     Temp 08/26/21 1536 98.9 F (37.2 C)     Temp Source 08/26/21 1536 Oral     SpO2 08/26/21 1536 97 %     Weight --      Height --      Head Circumference --      Peak Flow --      Pain Score 08/26/21 1534 9     Pain Loc --      Pain Edu? --      Excl. in [place]? --    No data found.  Updated Vital Signs BP 126/82 (BP Location: Right Arm)   Pulse 74   Temp 98.9 F (37.2 C) (Oral)   Resp 18   LMP  (LMP Unknown)   SpO2 97%   Visual Acuity Right Eye Distance:   Left Eye Distance:   Bilateral Distance:    Right Eye Near:   Left Eye Near:    Bilateral Near:     Physical Exam Vitals and nursing note reviewed.  Constitutional:      Appearance: She is well-developed.  HENT:     Head: Atraumatic.     Right Ear: External ear normal.     Left Ear: External ear normal.     Nose: Rhinorrhea present.     Mouth/Throat:     Pharynx: Posterior oropharyngeal erythema present. No oropharyngeal exudate.  Eyes:     Conjunctiva/sclera:  Conjunctivae normal.     Pupils: Pupils are equal, round, and reactive to light.  Cardiovascular:      Rate and Rhythm: Normal rate and regular rhythm.  Pulmonary:     Effort: Pulmonary effort is normal. No respiratory distress.     Breath sounds: No wheezing or rales.  Abdominal:     General: Bowel sounds are normal. There is no distension.     Palpations: Abdomen is soft.     Tenderness: There is no abdominal tenderness. There is no guarding.  Musculoskeletal:        General: Normal range of motion.     Cervical back: Normal range of motion and neck supple.  Lymphadenopathy:     Cervical: No cervical adenopathy.  Skin:    General: Skin is warm and dry.  Neurological:     Mental Status: She is alert and oriented to person, place, and time.  Psychiatric:        Behavior: Behavior normal.     UC Treatments / Results  Labs (all labs ordered are listed, but only abnormal results are displayed) Labs Reviewed  COVID-19, FLU A+B NAA    EKG   Radiology No results found.  Procedures Procedures (including critical care time)  Medications Ordered in UC Medications - No data to display  Initial Impression / Assessment and Plan / UC Course  I have reviewed the triage vital signs and the nursing notes.  Pertinent labs & imaging results that were available during my care of the patient were reviewed by me and considered in my medical decision making (see chart for details).     Vitals and exam reassuring, suspect viral illness possibly influenza given symptoms and exposures.  Will start Tamiflu proactively, Phenergan DM sent for cough.  Discussed over-the-counter supportive medications and home care.  Return for acutely worsening symptoms.  Final Clinical Impressions(s) / UC Diagnoses   Final diagnoses:  Exposure to the flu  Viral URI with cough   Discharge Instructions   None    ED Prescriptions     Medication Sig Dispense Auth. Provider   oseltamivir (TAMIFLU) 75 MG capsule Take 1 capsule (75 mg total) by mouth every 12 (twelve) hours. 10 capsule Volney American, [place]   promethazine-dextromethorphan (PROMETHAZINE-DM) 6.25-15 MG/5ML syrup Take 5 mLs by mouth 4 (four) times daily as needed for cough. 100 mL Volney American, [place]      PDMP not reviewed this encounter.   Volney American, [place] 08/26/21 1621

## 2021-08-27 LAB — COVID-19, FLU A+B NAA
Influenza A, NAA: NOT DETECTED
Influenza B, NAA: NOT DETECTED
SARS-CoV-2, NAA: NOT DETECTED

## 2021-08-29 ENCOUNTER — Ambulatory Visit: Payer: Self-pay | Admitting: Physician Assistant

## 2021-09-02 ENCOUNTER — Ambulatory Visit: Payer: Self-pay | Admitting: Physician Assistant

## 2021-09-02 DIAGNOSIS — J069 Acute upper respiratory infection, unspecified: Secondary | ICD-10-CM

## 2021-09-02 MED ORDER — AZITHROMYCIN 250 MG PO TABS: ORAL_TABLET | ORAL | 0 refills | Status: AC

## 2021-09-02 MED ORDER — BENZONATATE 100 MG PO CAPS: ORAL_CAPSULE | ORAL | 1 refills | Status: DC

## 2021-09-02 NOTE — Progress Notes (Signed)
LMP  (LMP Unknown)    Subjective:    Patient ID: [name], adult    DOB: 1981/06/09, 40 y.o.   MRN: [id number]  HPI: [name] is a 40 y.o. adult presenting on 09/02/2021 for No chief complaint on file.   HPI  This is a telemedicine appointment through [employer]  I connected with  [name] on 09/02/21 by a video enabled telemedicine application and verified that I am speaking with the correct person using two identifiers.   I discussed the limitations of evaluation and management by telemedicine. The patient expressed understanding and agreed to proceed.  Pt is at home.  Provider is at office.   Pt says she has been [place] for 1 1/2 wek.  She Got negative flu and covid tests at [facility] on 08/26/21 (PCR).  She finished tamiflu she was given.  Promethazine dm given but says it doesn't work.  She feels no better after the tamiflu.  Her son and daughter are also both sick.  Her Husband had it but he is better.  Pt reports  fever to 101, cough and congestion.  Runny nose.  Throat is raw.   She is using some mucinex.  She denies sob.       Relevant past medical, surgical, family and social history reviewed and updated as indicated. Interim medical history since our last visit reviewed. Allergies and medications reviewed and updated.    Current Outpatient Medications:    albuterol (VENTOLIN HFA) 108 (90 Base) MCG/ACT inhaler, Inhale 2 puffs into the lungs every 6 (six) hours as needed for wheezing or shortness of breath., Disp: 8 g, Rfl: 1   citalopram (CELEXA) 40 MG tablet, Take 1 tablet by mouth once daily, Disp: 30 tablet, Rfl: 4   fenofibrate (TRICOR) 145 MG tablet, Take 1 tablet (145 mg total) by mouth daily., Disp: 30 tablet, Rfl: 6   IRON PO, Take 1 tablet by mouth daily., Disp: , Rfl:    megestrol (MEGACE) 40 MG tablet, TAKE 1 TABLET BY MOUTH THREE TIMES DAILY UNTIL BLEEDING STOPS, THEN TAKE 1 TABLET ONCE DAILY UNTIL FOLLOW UP, Disp: 45 tablet, Rfl: 3   norethindrone  (JENCYCLA) 0.35 MG tablet, Take 1 tablet (0.35 mg total) by mouth daily., Disp: 84 tablet, Rfl: 3   simvastatin (ZOCOR) 40 MG tablet, Take 1 tablet (40 mg total) by mouth at bedtime., Disp: 30 tablet, Rfl: 6   cyclobenzaprine (FLEXERIL) 10 MG tablet, Take 1 tablet (10 mg total) by mouth 2 (two) times daily as needed for muscle spasms., Disp: 20 tablet, Rfl: 0   meloxicam (MOBIC) 15 MG tablet, TAKE 1 TABLET BY MOUTH ONCE DAILY AS NEEDED FOR PAIN, Disp: 30 tablet, Rfl: 0   pantoprazole (PROTONIX) 40 MG tablet, Take 1 tablet (40 mg total) by mouth daily., Disp: 30 tablet, Rfl: 6   promethazine-dextromethorphan (PROMETHAZINE-DM) 6.25-15 MG/5ML syrup, Take 5 mLs by mouth 4 (four) times daily as needed for cough. (Patient not taking: Reported on 09/02/2021), Disp: 100 mL, Rfl: 0    Review of Systems  Per HPI unless specifically indicated above     Objective:    LMP  (LMP Unknown)   Wt Readings from Last 3 Encounters:  05/07/21 267 lb (121.1 kg)  04/16/21 266 lb (120.7 kg)  04/03/21 230 lb (104.3 kg)    Physical Exam Constitutional:      General: She is not in acute distress.    Appearance: She is not toxic-appearing.  Comments: Pt looks like she doesn't feel well  HENT:     Head: Normocephalic and atraumatic.  Pulmonary:     Effort: No respiratory distress.     Comments: Pt is talking in complete sentences without dyspnea Neurological:     Mental Status: She is alert and oriented to person, place, and time.  Psychiatric:        Behavior: Behavior normal.    Results for orders placed or performed during the hospital encounter of 08/26/21  Covid-19, Flu A+B ([facility])   Specimen: Nasopharyngeal   Naso  Result Value Ref Range   SARS-CoV-2, NAA Not Detected Not Detected   Influenza A, NAA Not Detected Not Detected   Influenza B, NAA Not Detected Not Detected   Test Information: Comment       Assessment & Plan:    Encounter Diagnosis  Name Primary?   Upper respiratory  tract infection, unspecified type Yes     -Rx Zpack and mdi. And tessalon-  -pt counseled rest, fluids, APAP or IBU prn -she is to contact office if persists or for new symptoms

## 2021-09-11 ENCOUNTER — Ambulatory Visit: Payer: Self-pay | Admitting: Physician Assistant

## 2021-09-17 ENCOUNTER — Ambulatory Visit: Payer: Self-pay | Admitting: Physician Assistant

## 2021-09-17 DIAGNOSIS — K219 Gastro-esophageal reflux disease without esophagitis: Secondary | ICD-10-CM

## 2021-09-17 DIAGNOSIS — F419 Anxiety disorder, unspecified: Secondary | ICD-10-CM

## 2021-09-17 DIAGNOSIS — E785 Hyperlipidemia, unspecified: Secondary | ICD-10-CM

## 2021-09-17 DIAGNOSIS — R69 Illness, unspecified: Secondary | ICD-10-CM

## 2021-09-17 NOTE — Progress Notes (Signed)
LMP  (LMP Unknown)    Subjective:    Patient ID: Kambria A Dagostino, adult    DOB: Feb 03, 1981, 40 y.o.   MRN: [id number]  HPI: MODEAN MCCULLUM is a 40 y.o. adult presenting on 09/17/2021 for No chief complaint on file.   HPI  This is a telemedicine appointment through [employer].  I connected with  Marrian A Gartland on 09/17/21 by a video enabled telemedicine application and verified that I am speaking with the correct person using two identifiers.   I discussed the limitations of evaluation and management by telemedicine. The patient expressed understanding and agreed to proceed.  Pt is at home.  Provider is at office.     Pt is 40yoF with routine appointment to follow up dyslipidemia and mood.    Pt says she is sick again; she says her whole family is sick.  She got better and then got sick again.  She was seen at Urgent care 11/14 and here 11/21.  She got zpack at appt 09/02/21.  She reports Cough, congestion, runny nose, ST, EA, HA.  She says she has no fever and No GI symptoms.  She is using her inhaler some when she needs it.   Kids have been out of school about 2 weeks.  Their pediatrician said they had a cold except her son has an asthma flare.    Last covid test was several weeks ago.  Her husband BJ is still going to work despite being sick.  pt says she couldn't get her labs drawn due to being sick.  She says her mood is fine- not having problems with depression or anxiety.       Relevant past medical, surgical, family and social history reviewed and updated as indicated. Interim medical history since our last visit reviewed. Allergies and medications reviewed and updated.    Current Outpatient Medications:    albuterol (VENTOLIN HFA) 108 (90 Base) MCG/ACT inhaler, Inhale 2 puffs into the lungs every 6 (six) hours as needed for wheezing or shortness of breath., Disp: 8 g, Rfl: 1   benzonatate (TESSALON PERLES) 100 MG capsule, 1 - 2 po q 8 hour prn cough, Disp: 20 capsule,  Rfl: 1   citalopram (CELEXA) 40 MG tablet, Take 1 tablet by mouth once daily, Disp: 30 tablet, Rfl: 4   fenofibrate (TRICOR) 145 MG tablet, Take 1 tablet (145 mg total) by mouth daily., Disp: 30 tablet, Rfl: 6   guaiFENesin (MUCINEX) 600 MG 12 hr tablet, Take by mouth 2 (two) times daily., Disp: , Rfl:    IRON PO, Take 1 tablet by mouth daily., Disp: , Rfl:    megestrol (MEGACE) 40 MG tablet, TAKE 1 TABLET BY MOUTH THREE TIMES DAILY UNTIL BLEEDING STOPS, THEN TAKE 1 TABLET ONCE DAILY UNTIL FOLLOW UP, Disp: 45 tablet, Rfl: 3   norethindrone (JENCYCLA) 0.35 MG tablet, Take 1 tablet (0.35 mg total) by mouth daily., Disp: 84 tablet, Rfl: 3   pantoprazole (PROTONIX) 40 MG tablet, Take 1 tablet (40 mg total) by mouth daily., Disp: 30 tablet, Rfl: 6   simvastatin (ZOCOR) 40 MG tablet, Take 1 tablet (40 mg total) by mouth at bedtime., Disp: 30 tablet, Rfl: 6   cyclobenzaprine (FLEXERIL) 10 MG tablet, Take 1 tablet (10 mg total) by mouth 2 (two) times daily as needed for muscle spasms., Disp: 20 tablet, Rfl: 0   meloxicam (MOBIC) 15 MG tablet, TAKE 1 TABLET BY MOUTH ONCE DAILY AS NEEDED FOR PAIN, Disp: 30 tablet,  Rfl: 0   promethazine-dextromethorphan (PROMETHAZINE-DM) 6.25-15 MG/5ML syrup, Take 5 mLs by mouth 4 (four) times daily as needed for cough. (Patient not taking: Reported on 09/02/2021), Disp: 100 mL, Rfl: 0    Review of Systems  Per HPI unless specifically indicated above     Objective:    LMP  (LMP Unknown)   Wt Readings from Last 3 Encounters:  05/07/21 267 lb (121.1 kg)  04/16/21 266 lb (120.7 kg)  04/03/21 230 lb (104.3 kg)    Physical Exam Constitutional:      General: She is not in acute distress.    Appearance: She is obese. She is not toxic-appearing.     Comments: Pt is at home laying in bed.  She looks like she doesn't feel well but is not toxic  HENT:     Head: Normocephalic and atraumatic.  Pulmonary:     Effort: No respiratory distress.     Comments: Pt is talking  in complete sentences without dyspnea Neurological:     Mental Status: She is alert and oriented to person, place, and time.  Psychiatric:        Attention and Perception: Attention normal.        Speech: Speech normal.        Behavior: Behavior is cooperative.           Assessment & Plan:   Encounter Diagnoses  Name Primary?   Hyperlipidemia, unspecified hyperlipidemia type Yes   Anxiety    Gastroesophageal reflux disease, unspecified whether esophagitis present    Sick       -pt to get labs drawn when she can.  She will be called with results. -rest, fluids, OTCs as needed for symptomatic care -follow up 3 months.  She is to contact office sooner prn

## 2021-10-27 ENCOUNTER — Other Ambulatory Visit: Payer: Self-pay | Admitting: Physician Assistant

## 2021-11-23 ENCOUNTER — Other Ambulatory Visit: Payer: Self-pay | Admitting: Physician Assistant

## 2021-12-13 ENCOUNTER — Ambulatory Visit: Payer: Self-pay | Admitting: Nurse Practitioner

## 2021-12-15 ENCOUNTER — Other Ambulatory Visit: Payer: Self-pay | Admitting: Physician Assistant

## 2021-12-16 ENCOUNTER — Ambulatory Visit: Payer: Self-pay | Admitting: Nurse Practitioner

## 2021-12-17 ENCOUNTER — Other Ambulatory Visit: Payer: Self-pay

## 2021-12-17 ENCOUNTER — Ambulatory Visit: Payer: 59 | Admitting: Nurse Practitioner

## 2021-12-17 ENCOUNTER — Encounter: Payer: Self-pay | Admitting: Nurse Practitioner

## 2021-12-17 VITALS — BP 124/78 | HR 65 | Ht 66.0 in | Wt 268.0 lb

## 2021-12-17 DIAGNOSIS — Z2821 Immunization not carried out because of patient refusal: Secondary | ICD-10-CM | POA: Diagnosis not present

## 2021-12-17 DIAGNOSIS — E785 Hyperlipidemia, unspecified: Secondary | ICD-10-CM | POA: Diagnosis not present

## 2021-12-17 DIAGNOSIS — N946 Dysmenorrhea, unspecified: Secondary | ICD-10-CM

## 2021-12-17 DIAGNOSIS — Z139 Encounter for screening, unspecified: Secondary | ICD-10-CM

## 2021-12-17 DIAGNOSIS — F419 Anxiety disorder, unspecified: Secondary | ICD-10-CM

## 2021-12-17 DIAGNOSIS — R232 Flushing: Secondary | ICD-10-CM

## 2021-12-17 MED ORDER — HYDROXYZINE PAMOATE 25 MG PO CAPS: 25.0000 mg | ORAL_CAPSULE | Freq: Three times a day (TID) | ORAL | 0 refills | Status: DC | PRN

## 2021-12-17 MED ORDER — CITALOPRAM HYDROBROMIDE 40 MG PO TABS: 40.0000 mg | ORAL_TABLET | Freq: Every day | ORAL | 3 refills | Status: DC

## 2021-12-17 NOTE — Assessment & Plan Note (Signed)
Need for flu vaccine, COVID-vaccine tetanus vaccine discussed with patient.  She verbalized understanding patient refused flu vaccine today ?

## 2021-12-17 NOTE — Assessment & Plan Note (Addendum)
Takes fenofibrate 145 mg daily, simvastatin 40 mg daily ?Check lipid panel before next visit ?

## 2021-12-17 NOTE — Assessment & Plan Note (Signed)
Takes Megace 40 mg daily ?Hot flashes probably due to to side effect of megace. ?Check TSH before next visit to rule out thyroid dysfunction. ?Patient encouraged to discuss this concern with her OB/GYN at her  follow-up, she verbalized understanding ?

## 2021-12-17 NOTE — Assessment & Plan Note (Addendum)
Condition not well-controlled takes Celexa 40 mg daily ?Start hydroxyzine 25 mg every 8 hours as needed for anxiety ?Patient denies SI, HI ?GAD 7 score-21 ?

## 2021-12-17 NOTE — Progress Notes (Signed)
New Patient Office Visit  Subjective:  Patient ID: Piedad Climes, adult    DOB: 1980/11/01  Age: 41 y.o. MRN: [id number]  CC:  Chief Complaint  Patient presents with   New Patient (Initial Visit)    NP constantly hot and sweating all day long for the last 2-3 months     HPI [place] with medical history of hyperlipidemia, anxiety, obesity, dysmenorrhea presents to establish care for her chronic medical conditions [facility] .    Takes celexa 6m daily for anxiety, pt sttes that she has been on this med fro about 2 years , medication not helping her anxiety well.  Patient denies SI, HI  Pt states that she has heavy menstrual bleeding, [facility] Takes megace once daily , takes med three times daily when she starts bleeding.  Takes meloxicam 15 mg once daily for pain,norethindrone 0.357mtablets daily.  Hot flashes.  Patient c/o constant sweating all day for the past 2 to 3 months.  States that she has not discussed these concerns with OBGYN , she has been taking megace for months . Patient refused flu vaccine, did not take any dose of COVID 19 vaccine due for Tdap vaccine.    Need for all vaccines discussed with patient patient encouraged to get these vaccines she verbalized understanding   Past Medical History:  Diagnosis Date   Anxiety    Arthritis    Depression    Hypercholesteremia    Sinus infection 03/27/2021    Past Surgical History:  Procedure Laterality Date   BREAST BIOPSY Left 04/03/2021   Procedure: BREAST BIOPSY; excision of duct lesion;  Surgeon: BrVirl CageyMD;  Location: [facility];  Service: General;  Laterality: Left;   CHOLECYSTECTOMY N/A 07/13/2014   Procedure: LAPAROSCOPIC CHOLECYSTECTOMY;  Surgeon: WiScherry RanMD;  Location: [facility];  Service: General;  Laterality: N/A;   DENTAL SURGERY  2018   plate top & bottom   TUBAL LIGATION      Family History  Problem Relation Age of  Onset   Heart disease Mother    Diabetes Mother    Heart disease Father    Hypertension Father    Thyroid disease Father    Hyperlipidemia Father    Breast cancer Paternal Aunt    Cancer - Colon Paternal Uncle    Breast cancer Paternal Grandmother     Social History   Socioeconomic History   Marital status: Married    Spouse name: Not on file   Number of children: 2   Years of education: Not on file   Highest education level: Not on file  Occupational History   Not on file  Tobacco Use   Smoking status: Former    Packs/day: 0.50    Years: 15.00    Pack years: 7.50    Types: Cigarettes    Quit date: 11/14/2019    Years since quitting: 2.0   Smokeless tobacco: Never  Vaping Use   Vaping Use: Never used  Substance and Sexual Activity   Alcohol use: No   Drug use: No   Sexual activity: Yes    Birth control/protection: Surgical, Pill  Other Topics Concern   Not on file  Social History Narrative   Not on file   Social Determinants of Health   Financial Resource Strain: Not on file  Food Insecurity: Food Insecurity Present   Worried About Running Out of  Food in the Last Year: Often true   Ran Out of Food in the Last Year: Never true  Transportation Needs: Not on file  Physical Activity: Not on file  Stress: Not on file  Social Connections: Not on file  Intimate Partner Violence: Not on file    ROS Review of Systems  Constitutional: Negative.   Respiratory: Negative.    Cardiovascular: Negative.   Endocrine: Positive for heat intolerance. Negative for cold intolerance, polydipsia, polyphagia and polyuria.  Psychiatric/Behavioral:  Negative for agitation, behavioral problems, confusion, hallucinations and self-injury. The patient is nervous/anxious.    Objective:   Today's Vitals: BP 124/78 (BP Location: Right Arm, Patient Position: Sitting, Cuff Size: Large)    Pulse 65    Ht 5' 6"  (1.676 m)    Wt 268 lb (121.6 kg)    LMP  (LMP Unknown)    SpO2 97%    BMI 43.26  kg/m   Physical Exam Constitutional:      General: She is not in acute distress.    Appearance: She is obese. She is not ill-appearing, toxic-appearing or diaphoretic.  Cardiovascular:     Rate and Rhythm: Regular rhythm. Tachycardia present.     Pulses: Normal pulses.     Heart sounds: Normal heart sounds. No murmur heard.   No friction rub. No gallop.  Pulmonary:     Effort: Pulmonary effort is normal. No respiratory distress.     Breath sounds: Normal breath sounds. No stridor. No wheezing, rhonchi or rales.  Chest:     Chest wall: No tenderness.  Neurological:     Mental Status: She is alert and oriented to person, place, and time.  Psychiatric:        Mood and Affect: Mood normal.        Behavior: Behavior normal.        Thought Content: Thought content normal.        Judgment: Judgment normal.    Assessment & Plan:   Problem List Items Addressed This Visit       Cardiovascular and Mediastinum   Hot flashes    Takes Megace 40 mg daily Hot flashes probably due to to side effect of megace. Check TSH before next visit to rule out thyroid dysfunction. Patient encouraged to discuss this concern with her OB/GYN at her  follow-up, she verbalized understanding        Genitourinary   Dysmenorrhea    Followed [facility].   Pt states that she has heavy menstrual bleeding, she is been followed by [facility]. Takes megace once daily , takes med three times daily when she starts bleeding.  Takes meloxicam 15 mg once daily for pain,norethindrone 0.51m tablets daily. Patient encouraged to maintain close follow-up with GYN she verbalized understanding.         Other   Hyperlipidemia - Primary    Takes fenofibrate 145 mg daily, simvastatin 40 mg daily Check lipid panel before next visit      Relevant Orders   CMP14+EGFR   CBC with Differential   Lipid Profile   Anxiety    Condition not well-controlled takes Celexa 40 mg daily Start  hydroxyzine 25 mg every 8 hours as needed for anxiety Patient denies SI, HI GAD 7 score-21      Relevant Medications   citalopram (CELEXA) 40 MG tablet   hydrOXYzine (VISTARIL) 25 MG capsule   Refused influenza vaccine    Need for flu vaccine, COVID-vaccine tetanus  vaccine discussed with patient.  She verbalized understanding patient refused flu vaccine today      Other Visit Diagnoses     Screening due       Relevant Orders   HgB A1c   TSH + free T4   Vitamin D (25 hydroxy)   HIV antibody (with reflex)   Hepatitis C Antibody       Outpatient Encounter Medications as of 12/17/2021  Medication Sig   fenofibrate (TRICOR) 145 MG tablet Take 1 tablet (145 mg total) by mouth daily.   hydrOXYzine (VISTARIL) 25 MG capsule Take 1 capsule (25 mg total) by mouth every 8 (eight) hours as needed.   megestrol (MEGACE) 40 MG tablet TAKE 1 TABLET BY MOUTH THREE TIMES DAILY UNTIL BLEEDING STOPS, THEN TAKE 1 TABLET ONCE DAILY UNTIL FOLLOW UP   meloxicam (MOBIC) 15 MG tablet TAKE 1 TABLET BY MOUTH ONCE DAILY AS NEEDED FOR PAIN   norethindrone (JENCYCLA) 0.35 MG tablet Take 1 tablet (0.35 mg total) by mouth daily.   pantoprazole (PROTONIX) 40 MG tablet Take 1 tablet (40 mg total) by mouth daily.   simvastatin (ZOCOR) 40 MG tablet Take 1 tablet (40 mg total) by mouth at bedtime.   [DISCONTINUED] citalopram (CELEXA) 40 MG tablet Take 1 tablet by mouth once daily   citalopram (CELEXA) 40 MG tablet Take 1 tablet (40 mg total) by mouth daily.   guaiFENesin (MUCINEX) 600 MG 12 hr tablet Take by mouth 2 (two) times daily. (Patient not taking: Reported on 12/17/2021)   IRON PO Take 1 tablet by mouth daily. (Patient not taking: Reported on 12/17/2021)   [DISCONTINUED] albuterol (VENTOLIN HFA) 108 (90 Base) MCG/ACT inhaler Inhale 2 puffs into the lungs every 6 (six) hours as needed for wheezing or shortness of breath. (Patient not taking: Reported on 12/17/2021)   [DISCONTINUED] benzonatate (TESSALON PERLES) 100  MG capsule 1 - 2 po q 8 hour prn cough (Patient not taking: Reported on 12/17/2021)   [DISCONTINUED] cyclobenzaprine (FLEXERIL) 10 MG tablet Take 1 tablet (10 mg total) by mouth 2 (two) times daily as needed for muscle spasms. (Patient not taking: Reported on 12/17/2021)   [DISCONTINUED] predniSONE (DELTASONE) 10 MG tablet TAKE 6 TABLETS BY MOUTH ONCE DAILY FOR 2 DAYS, 5 TABLETS ONCE DAILY FOR 2 DAYS, 4 TABLETS ONCE DAILY FOR 2 DAYS, 3 TABLETS ONCE DAILY FOR 2 DAYS, 2 TABLETS ONCE DAILY FOR 2 DAYS, 1 TABLET ONCE DAILY FOR 2 DAYS   [DISCONTINUED] promethazine-dextromethorphan (PROMETHAZINE-DM) 6.25-15 MG/5ML syrup Take 5 mLs by mouth 4 (four) times daily as needed for cough. (Patient not taking: Reported on 09/02/2021)   No facility-administered encounter medications on file as of 12/17/2021.    Follow-up: Return in about 2 months (around 02/16/2022) for annual physical exam.   Renee Rival, FNP

## 2021-12-17 NOTE — Assessment & Plan Note (Addendum)
Followed by OB/[facility]N in at [facility]. ?  ?Pt states that she has heavy menstrual bleeding, she is been followed by OBGYN at [facility]. Takes megace 40mg  once daily , takes med three times daily when she starts bleeding.  Takes meloxicam 15 mg once daily for pain,norethindrone 0.35mg  tablets daily. ?Patient encouraged to maintain close follow-up with GYN she verbalized understanding.  ?

## 2021-12-17 NOTE — Patient Instructions (Addendum)
Pleas get labs done 3-5 days before your next appointment .  ? ?Start taking hydroxyzine 64mhg three times daily as needed for anxiety ? ? ?It is important that you exercise regularly at least 30 minutes 5 times a week.  ?Think about what you will eat, plan ahead. ?Choose " clean, green, fresh or frozen" over canned, processed or packaged foods which are more sugary, salty and fatty. ?70 to 75% of food eaten should be vegetables and fruit. ?Three meals at set times with snacks allowed between meals, but they must be fruit or vegetables. ?Aim to eat over a 12 hour period , example 7 am to 7 pm, and STOP after  your last meal of the day. ?Drink water,generally about 64 ounces per day, no other drink is as healthy. Fruit juice is best enjoyed in a healthy way, by EATING the fruit. ? ?Thanks for choosing [facility], we consider it a privelige to serve you. ? ? ? ?

## 2021-12-29 ENCOUNTER — Ambulatory Visit
Admission: EM | Admit: 2021-12-29 | Discharge: 2021-12-29 | Disposition: A | Discharge status: Home / Self Care | Payer: 59 | Attending: Urgent Care | Admitting: Urgent Care

## 2021-12-29 ENCOUNTER — Other Ambulatory Visit: Payer: Self-pay

## 2021-12-29 DIAGNOSIS — M659 Synovitis and tenosynovitis, unspecified: Secondary | ICD-10-CM

## 2021-12-29 DIAGNOSIS — M65931 Unspecified synovitis and tenosynovitis, right forearm: Secondary | ICD-10-CM

## 2021-12-29 DIAGNOSIS — M25531 Pain in right wrist: Secondary | ICD-10-CM

## 2021-12-29 MED ORDER — NAPROXEN 500 MG PO TABS: 500.0000 mg | ORAL_TABLET | Freq: Two times a day (BID) | ORAL | 0 refills | Status: DC

## 2021-12-29 NOTE — ED Triage Notes (Signed)
Pt reports pain and swelling in right and and right wrist x 2 weeks.  ?

## 2021-12-29 NOTE — ED Provider Notes (Signed)
?-[facility] ? ? ?MRN: [id number] DOB: 04/27/1981 ? ?Subjective:  ? ?[name] is a 41 y.o. adult presenting for 2-week history of persistent and worsening right wrist pain and swelling.  This has limited her range of motion extensively.  She does a lot of work with her hands and wrists, and is right-handed.  She has 3 kids, does home schooling and one of them is a 6-year-old this well.  No fall, trauma, history of musculoskeletal disorders. ? ?No current facility-administered medications for this encounter. ? ?Current Outpatient Medications:  ?  citalopram (CELEXA) 40 MG tablet, Take 1 tablet (40 mg total) by mouth daily., Disp: 30 tablet, Rfl: 3 ?  fenofibrate (TRICOR) 145 MG tablet, Take 1 tablet (145 mg total) by mouth daily., Disp: 30 tablet, Rfl: 6 ?  guaiFENesin (MUCINEX) 600 MG 12 hr tablet, Take by mouth 2 (two) times daily. (Patient not taking: Reported on 12/17/2021), Disp: , Rfl:  ?  hydrOXYzine (VISTARIL) 25 MG capsule, Take 1 capsule (25 mg total) by mouth every 8 (eight) hours as needed., Disp: 30 capsule, Rfl: 0 ?  IRON PO, Take 1 tablet by mouth daily. (Patient not taking: Reported on 12/17/2021), Disp: , Rfl:  ?  megestrol (MEGACE) 40 MG tablet, TAKE 1 TABLET BY MOUTH THREE TIMES DAILY UNTIL BLEEDING STOPS, THEN TAKE 1 TABLET ONCE DAILY UNTIL FOLLOW UP, Disp: 45 tablet, Rfl: 3 ?  meloxicam (MOBIC) 15 MG tablet, TAKE 1 TABLET BY MOUTH ONCE DAILY AS NEEDED FOR PAIN, Disp: 30 tablet, Rfl: 3 ?  norethindrone (JENCYCLA) 0.35 MG tablet, Take 1 tablet (0.35 mg total) by mouth daily., Disp: 84 tablet, Rfl: 3 ?  pantoprazole (PROTONIX) 40 MG tablet, Take 1 tablet (40 mg total) by mouth daily., Disp: 30 tablet, Rfl: 6 ?  simvastatin (ZOCOR) 40 MG tablet, Take 1 tablet (40 mg total) by mouth at bedtime., Disp: 30 tablet, Rfl: 6  ? ?No Known Allergies ? ?Past Medical History:  ?Diagnosis Date  ? Anxiety   ? Arthritis   ? Depression   ? Hypercholesteremia   ? Sinus infection 03/27/2021  ?   ? ?Past Surgical History:  ?Procedure Laterality Date  ? BREAST BIOPSY Left 04/03/2021  ? Procedure: BREAST BIOPSY; excision of duct lesion;  Surgeon: Lucretia Roers, MD;  Location: [facility];  Service: General;  Laterality: Left;  ? CHOLECYSTECTOMY N/A 07/13/2014  ? Procedure: LAPAROSCOPIC CHOLECYSTECTOMY;  Surgeon: Marlane Hatcher, MD;  Location: [facility];  Service: General;  Laterality: N/A;  ? DENTAL SURGERY  2018  ? plate top & bottom  ? TUBAL LIGATION    ? ? ?Family History  ?Problem Relation Age of Onset  ? Heart disease Mother   ? Diabetes Mother   ? Heart disease Father   ? Hypertension Father   ? Thyroid disease Father   ? Hyperlipidemia Father   ? Breast cancer Paternal Aunt   ? Cancer - Colon Paternal Uncle   ? Breast cancer Paternal Grandmother   ? ? ?Social History  ? ?Tobacco Use  ? Smoking status: Former  ?  Packs/day: 0.50  ?  Years: 15.00  ?  Pack years: 7.50  ?  Types: Cigarettes  ?  Quit date: 11/14/2019  ?  Years since quitting: 2.1  ? Smokeless tobacco: Never  ?Vaping Use  ? Vaping Use: Never used  ?Substance Use Topics  ? Alcohol use: No  ? Drug use: No  ? ? ?ROS ? ? ?Objective:  ? ?  Vitals: ?BP 137/86 (BP Location: Right Arm)   Pulse 86   Temp 98.2 ?F (36.8 ?C) (Oral)   Resp 18   LMP  (LMP Unknown)   SpO2 98%  ? ?Physical Exam ?Constitutional:   ?   General: She is not in acute distress. ?   Appearance: Normal appearance. She is well-developed and normal weight. She is not ill-appearing, toxic-appearing or diaphoretic.  ?HENT:  ?   Head: Normocephalic and atraumatic.  ?   Right Ear: External ear normal.  ?   Left Ear: External ear normal.  ?   Nose: Nose normal.  ?   Mouth/Throat:  ?   Pharynx: Oropharynx is clear.  ?Eyes:  ?   General: No scleral icterus.    ?   Right eye: No discharge.     ?   Left eye: No discharge.  ?   Extraocular Movements: Extraocular movements intact.  ?Cardiovascular:  ?   Rate and Rhythm: Normal rate.  ?Pulmonary:  ?   Effort: Pulmonary effort is normal.   ?Musculoskeletal:  ?     Hands: ? ?   Cervical back: Normal range of motion.  ?Neurological:  ?   Mental Status: She is alert and oriented to person, place, and time.  ?Psychiatric:     ?   Mood and Affect: Mood normal.     ?   Behavior: Behavior normal.     ?   Thought Content: Thought content normal.     ?   Judgment: Judgment normal.  ? ? ?Assessment and Plan :  ? ?PDMP not reviewed this encounter. ? ?1. Tenosynovitis of right wrist   ?2. Acute pain of right wrist   ? ?Patient declined an oral steroid course for now.  I recommend that she follow-up closely with an orthopedist, information provided to her for this.  She would benefit more from a local injection and therefore she will pursue this.  In the meantime wear the thumb spica splint, rest wherever possible and use naproxen for pain and inflammation. Counseled patient on potential for adverse effects with medications prescribed/recommended today, ER and return-to-clinic precautions discussed, patient verbalized understanding. ? ?  ?Wallis Bamberg, PA-C ?12/29/21 1016 ? ?

## 2021-12-30 ENCOUNTER — Other Ambulatory Visit (HOSPITAL_COMMUNITY): Payer: Self-pay | Admitting: Nurse Practitioner

## 2021-12-30 DIAGNOSIS — Z1231 Encounter for screening mammogram for malignant neoplasm of breast: Secondary | ICD-10-CM

## 2022-01-03 ENCOUNTER — Encounter: Payer: Self-pay | Admitting: Orthopedic Surgery

## 2022-01-03 ENCOUNTER — Ambulatory Visit: Payer: 59 | Admitting: Orthopedic Surgery

## 2022-01-03 ENCOUNTER — Other Ambulatory Visit: Payer: Self-pay

## 2022-01-03 VITALS — Ht 66.0 in | Wt 261.0 lb

## 2022-01-03 DIAGNOSIS — M65311 Trigger thumb, right thumb: Secondary | ICD-10-CM

## 2022-01-03 NOTE — Progress Notes (Signed)
New Patient Visit ? ?Assessment: ?[name] is a 41 y.o. female with the following: ?Right trigger thumb ? ?Plan: ?Symptoms and physical exam most consistent with trigger finger.  We discussed etiology, and potential treatment options.  Surgery was discussed in detail, including the plan for procedure, and expected recovery.  After discussing these options, the patient would like to proceed with a steroid injection.  This was completed in clinic today.  Depending on the efficacy of the injection, we could consider another injection.  However, if the injection does not provide sustained relief, I would recommend surgery.  All of this was discussed with the patient, and they are amenable to this plan.  Follow-up as needed. ? ? ?Procedure note injection - Right Thumb A1 Pulley ? ?Verbal consent was obtained to inject the Right Thumb A1 pulley ?Timeout was completed to confirm the site of injection.  The skin was prepped with alcohol and ethyl chloride was sprayed at the injection site.  ?A 21-gauge needle was used to inject 40 mg of Depo-Medrol and 1% lidocaine (1 cc) into the Right Thumb using a direct anterior approach.  ?There were no complications. Patient tolerated the procedure well. A sterile bandage was applied ? ? ? ? ?Follow-up: ?Return if symptoms worsen or fail to improve. ? ?Subjective: ? ?Chief Complaint  ?Patient presents with  ? Wrist Pain  ?  Rt thumb pain going into the wrist for 2 months. NKI  ? ? ?History of Present Illness: ?[name] is a 41 y.o. RHD female who presents for evaluation of Right Thumb pain.  Patient states that she started to have pain in her right thumb for the past 2 months.  Her pain is worse in the morning.  She localizes the pain from the tip of the thumb, extending proximally, just distal to the wrist crease.  She notes a catching sensation in the right thumb.  She has tried some naproxen, with limited improvement in her symptoms.  She is currently wearing a thumb  spica brace.  No injury to her right thumb. ? ? ?Review of Systems: ?No fevers or chills ?No numbness or tingling ?No chest pain ?No shortness of breath ?No bowel or bladder dysfunction ?No GI distress ?No headaches ? ? ?Medical History: ? ?Past Medical History:  ?Diagnosis Date  ? Anxiety   ? Arthritis   ? Depression   ? Hypercholesteremia   ? Sinus infection 03/27/2021  ? ? ?Past Surgical History:  ?Procedure Laterality Date  ? BREAST BIOPSY Left 04/03/2021  ? Procedure: BREAST BIOPSY; excision of duct lesion;  Surgeon: Virl Cagey, MD;  Location: [facility];  Service: General;  Laterality: Left;  ? CHOLECYSTECTOMY N/A 07/13/2014  ? Procedure: LAPAROSCOPIC CHOLECYSTECTOMY;  Surgeon: Scherry Ran, MD;  Location: [facility];  Service: General;  Laterality: N/A;  ? DENTAL SURGERY  2018  ? plate top & bottom  ? TUBAL LIGATION    ? ? ?Family History  ?Problem Relation Age of Onset  ? Heart disease Mother   ? Diabetes Mother   ? Heart disease Father   ? Hypertension Father   ? Thyroid disease Father   ? Hyperlipidemia Father   ? Breast cancer Paternal Aunt   ? Cancer - Colon Paternal Uncle   ? Breast cancer Paternal Grandmother   ? ?Social History  ? ?Tobacco Use  ? Smoking status: Former  ?  Packs/day: 0.50  ?  Years: 15.00  ?  Pack years: 7.50  ?  Types: Cigarettes  ?  Quit date: 11/14/2019  ?  Years since quitting: 2.1  ? Smokeless tobacco: Never  ?Vaping Use  ? Vaping Use: Never used  ?Substance Use Topics  ? Alcohol use: No  ? Drug use: No  ? ? ?No Known Allergies ? ?No outpatient medications have been marked as taking for the 01/03/22 encounter (Office Visit) with Mordecai Rasmussen, MD.  ? ? ?Objective: ?Ht 5\' 6"  (1.676 m)   Wt 261 lb (118.4 kg)   LMP  (LMP Unknown)   BMI 42.13 kg/m?  ? ?Physical Exam: ? ?General: Alert and oriented. and No acute distress. ?Gait: Normal gait. ? ?Right hand without deformity.  No tenderness to palpation over the first dorsal compartment.  Tenderness palpation over the A1 pulley to  the right thumb.  No active triggering is appreciated in clinic today.  She is able to make a full fist.  Fingers are warm and well-perfused. ? ? ?IMAGING: ?No new imaging obtained today ? ? ?New Medications:  ?No orders of the defined types were placed in this encounter. ? ? ? ? ?Mordecai Rasmussen, MD ? ?01/03/2022 ?8:53 AM ? ?   ?

## 2022-01-03 NOTE — Patient Instructions (Signed)

## 2022-01-06 ENCOUNTER — Ambulatory Visit: Payer: Self-pay | Admitting: Physician Assistant

## 2022-01-31 ENCOUNTER — Ambulatory Visit (HOSPITAL_COMMUNITY)
Admission: RE | Admit: 2022-01-31 | Discharge: 2022-01-31 | Disposition: A | Discharge status: Home / Self Care | Payer: 59 | Source: Ambulatory Visit | Attending: Nurse Practitioner | Admitting: Nurse Practitioner

## 2022-01-31 DIAGNOSIS — Z1231 Encounter for screening mammogram for malignant neoplasm of breast: Secondary | ICD-10-CM | POA: Diagnosis present

## 2022-02-01 NOTE — Progress Notes (Signed)
No mammographic evidence of malignancy, repeat in one year.

## 2022-02-02 ENCOUNTER — Other Ambulatory Visit: Payer: Self-pay | Admitting: Physician Assistant

## 2022-02-02 ENCOUNTER — Other Ambulatory Visit: Payer: Self-pay | Admitting: Obstetrics & Gynecology

## 2022-02-11 LAB — CBC WITH DIFFERENTIAL/PLATELET
Basophils Absolute: 0 10*3/uL (ref 0.0–0.2)
Basos: 1 %
EOS (ABSOLUTE): 0.1 10*3/uL (ref 0.0–0.4)
Eos: 2 %
Hematocrit: 36.3 % (ref 34.0–46.6)
Hemoglobin: 12.5 g/dL (ref 11.1–15.9)
Immature Grans (Abs): 0 10*3/uL (ref 0.0–0.1)
Immature Granulocytes: 0 %
Lymphocytes Absolute: 1.3 10*3/uL (ref 0.7–3.1)
Lymphs: 35 %
MCH: 30.8 pg (ref 26.6–33.0)
MCHC: 34.4 g/dL (ref 31.5–35.7)
MCV: 89 fL (ref 79–97)
Monocytes Absolute: 0.4 10*3/uL (ref 0.1–0.9)
Monocytes: 10 %
Neutrophils Absolute: 2 10*3/uL (ref 1.4–7.0)
Neutrophils: 52 %
Platelets: 251 10*3/uL (ref 150–450)
RBC: 4.06 x10E6/uL (ref 3.77–5.28)
RDW: 13.4 % (ref 11.7–15.4)
WBC: 3.8 10*3/uL (ref 3.4–10.8)

## 2022-02-11 LAB — CMP14+EGFR
ALT: 46 IU/L — ABNORMAL HIGH (ref 0–32)
AST: 38 IU/L (ref 0–40)
Albumin/Globulin Ratio: 2.1 (ref 1.2–2.2)
Albumin: 4.4 g/dL (ref 3.8–4.8)
Alkaline Phosphatase: 47 IU/L (ref 44–121)
BUN/Creatinine Ratio: 14 (ref 9–23)
BUN: 14 mg/dL (ref 6–24)
Bilirubin Total: 0.5 mg/dL (ref 0.0–1.2)
CO2: 19 mmol/L — ABNORMAL LOW (ref 20–29)
Calcium: 9.4 mg/dL (ref 8.7–10.2)
Chloride: 107 mmol/L — ABNORMAL HIGH (ref 96–106)
Creatinine, Ser: 0.99 mg/dL (ref 0.57–1.00)
Globulin, Total: 2.1 g/dL (ref 1.5–4.5)
Glucose: 75 mg/dL (ref 70–99)
Potassium: 3.8 mmol/L (ref 3.5–5.2)
Sodium: 141 mmol/L (ref 134–144)
Total Protein: 6.5 g/dL (ref 6.0–8.5)
eGFR: 73 mL/min/{1.73_m2} (ref >59)

## 2022-02-11 LAB — LIPID PANEL
Chol/HDL Ratio: 4.2 ratio (ref 0.0–4.4)
Cholesterol, Total: 123 mg/dL (ref 100–199)
HDL: 29 mg/dL — ABNORMAL LOW (ref >39)
LDL Chol Calc (NIH): 68 mg/dL (ref 0–99)
Triglycerides: 147 mg/dL (ref 0–149)
VLDL Cholesterol Cal: 26 mg/dL (ref 5–40)

## 2022-02-11 LAB — TSH+FREE T4
Free T4: 1.05 ng/dL (ref 0.82–1.77)
TSH: 1.23 u[IU]/mL (ref 0.450–4.500)

## 2022-02-11 LAB — HEPATITIS C ANTIBODY: Hep C Virus Ab: NONREACTIVE

## 2022-02-11 LAB — VITAMIN D 25 HYDROXY (VIT D DEFICIENCY, FRACTURES): Vit D, 25-Hydroxy: 10 ng/mL — ABNORMAL LOW (ref 30.0–100.0)

## 2022-02-11 LAB — HEMOGLOBIN A1C
Est. average glucose Bld gHb Est-mCnc: 131 mg/dL
Hgb A1c MFr Bld: 6.2 % — ABNORMAL HIGH (ref 4.8–5.6)

## 2022-02-11 LAB — HIV ANTIBODY (ROUTINE TESTING W REFLEX): HIV Screen 4th Generation wRfx: NONREACTIVE

## 2022-02-15 IMAGING — US US BREAST*L* LIMITED INC AXILLA
1 series · 1 of 1 positions shown · non-contrast
Comparison: Current screening exam, baseline study, 01/16/2021.

CLINICAL DATA: Screening recall for a possible left breast
asymmetry.

EXAM:
DIGITAL DIAGNOSTIC UNILATERAL LEFT MAMMOGRAM WITH TOMOSYNTHESIS AND
CAD; ULTRASOUND LEFT BREAST LIMITED
TECHNIQUE: Left digital diagnostic mammography and breast tomosynthesis was
performed. The images were evaluated with computer-aided detection.;
Targeted ultrasound examination of the left breast was performed

[Series 1: us breast*left* limited inc axilla · 0.07mm/px · 1 of 1 slices shown]
[im 1/1]
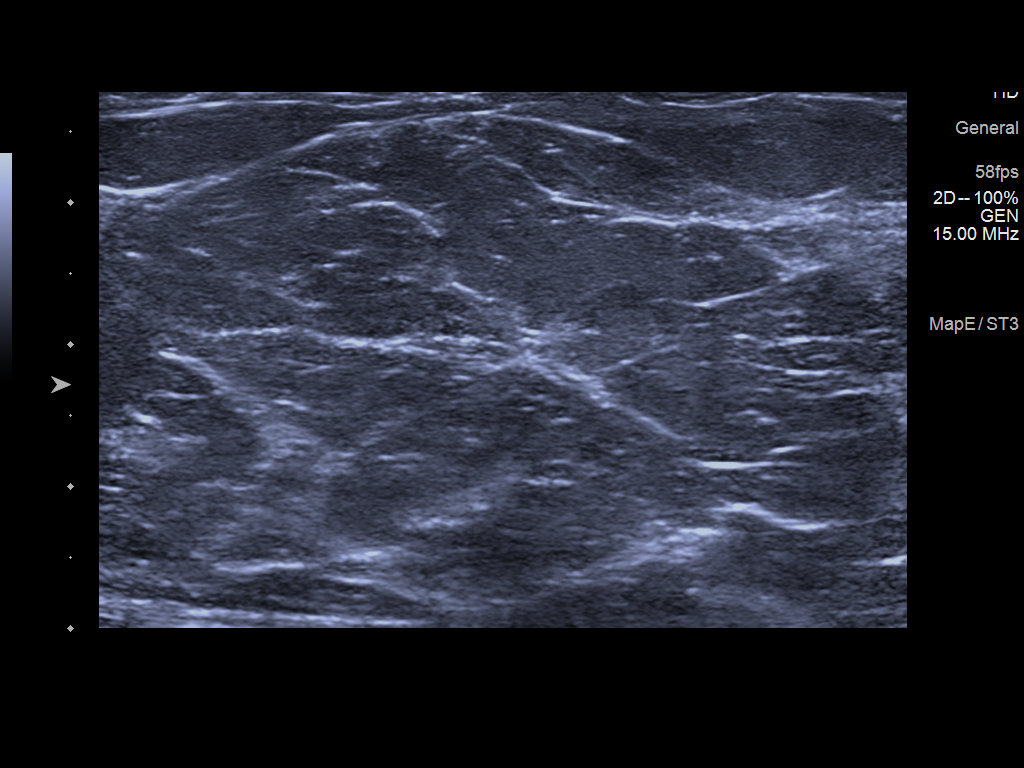

[1 of 1 positions shown; findings below may reference images not displayed]

ACR Breast Density Category b: There are scattered areas of
fibroglandular density.
FINDINGS: The area of possible asymmetry, in the lateral retroareolar left
breast, disperses on spot compression imaging consistent with normal
fibroglandular tissue. There is no underlying mass. There are no
areas of architectural distortion and no suspicious calcifications.

On physical exam, there is no palpable mass in the lateral
periareolar left breast.

Targeted ultrasound is performed, showing normal tissue throughout
the lateral breast. No mass or suspicious lesion.
IMPRESSION: 1. No evidence of breast malignancy.
2. Physiologic asymmetry the fibroglandular tissue.

RECOMMENDATION:
Screening mammogram in one year.(Code:SZ-G-OM2)

I have discussed the findings and recommendations with the patient.
If applicable, a reminder letter will be sent to the patient
regarding the next appointment.

BI-RADS CATEGORY  1: Negative.

## 2022-02-18 ENCOUNTER — Encounter: Payer: Self-pay | Admitting: Nurse Practitioner

## 2022-02-18 ENCOUNTER — Other Ambulatory Visit: Payer: Self-pay | Admitting: Nurse Practitioner

## 2022-02-18 ENCOUNTER — Ambulatory Visit: Payer: 59 | Admitting: Nurse Practitioner

## 2022-02-18 VITALS — BP 129/82 | HR 73 | Ht 65.0 in | Wt 269.0 lb

## 2022-02-18 DIAGNOSIS — E785 Hyperlipidemia, unspecified: Secondary | ICD-10-CM

## 2022-02-18 DIAGNOSIS — R7303 Prediabetes: Secondary | ICD-10-CM | POA: Insufficient documentation

## 2022-02-18 DIAGNOSIS — Z0000 Encounter for general adult medical examination without abnormal findings: Secondary | ICD-10-CM | POA: Diagnosis not present

## 2022-02-18 DIAGNOSIS — E559 Vitamin D deficiency, unspecified: Secondary | ICD-10-CM | POA: Diagnosis not present

## 2022-02-18 DIAGNOSIS — N946 Dysmenorrhea, unspecified: Secondary | ICD-10-CM | POA: Diagnosis not present

## 2022-02-18 DIAGNOSIS — Z6841 Body Mass Index (BMI) 40.0 and over, adult: Secondary | ICD-10-CM

## 2022-02-18 MED ORDER — VITAMIN D (ERGOCALCIFEROL) 1.25 MG (50000 UNIT) PO CAPS
50000.0000 [IU] | ORAL_CAPSULE | ORAL | 0 refills | Status: DC
Start: 2022-02-18 — End: 2022-03-26

## 2022-02-18 MED ORDER — MEGESTROL ACETATE 40 MG PO TABS: ORAL_TABLET | ORAL | 0 refills | Status: DC

## 2022-02-18 MED ORDER — MELOXICAM 15 MG PO TABS: ORAL_TABLET | ORAL | 1 refills | Status: DC

## 2022-02-18 NOTE — Assessment & Plan Note (Signed)
Lab Results  ?Component Value Date  ? HGBA1C 6.2 (H) 02/10/2022  ? ?Avoid sugar sweets soda, juice ?Engage in vigorous exercises at least 150 minutes weekly ?

## 2022-02-18 NOTE — Patient Instructions (Signed)

## 2022-02-18 NOTE — Assessment & Plan Note (Addendum)
Chronic condition ?Megace 40mg  once daily refilled, on norethindrone  0.35 mg tablets daily. ?Continue current medications ?Pt encouraged to follw up with OBGYN.  She verbalized understanding ?

## 2022-02-18 NOTE — Assessment & Plan Note (Addendum)
Last vitamin D ?Lab Results  ?Component Value Date  ? VD25OH 10.0 (L) 02/10/2022  ?Start vitamin D 50,000 units once weekly for 8 weeks, take vitamin D 1000 units daily after 8 weeks  ?

## 2022-02-18 NOTE — Assessment & Plan Note (Signed)
Lab Results  ?Component Value Date  ? CHOL 123 02/10/2022  ? HDL 29 (L) 02/10/2022  ? LDLCALC 68 02/10/2022  ? TRIG 147 02/10/2022  ? CHOLHDL 4.2 02/10/2022  ?Chronic condition well-controlled on simvastatin 4mg  daily 0, fenofibrate 145 mg daily continue current medications avoid fatty fried foods. ?

## 2022-02-18 NOTE — Assessment & Plan Note (Signed)
Wt Readings from Last 3 Encounters:  ?02/18/22 269 lb (122 kg)  ?01/03/22 261 lb (118.4 kg)  ?12/17/21 268 lb (121.6 kg)  ?Need to increase intake of whole food consisting mainly vegetables and protein less carbohydrate drinking at least 64 ounces of water daily, engaging in regular vigorous exercises at least 150 minutes weekly, importance of portion control also discussed. ?Benefits of healthy weight discussed with patient she verbalized understanding ?

## 2022-02-18 NOTE — Assessment & Plan Note (Signed)
Annual exam as documented.  ?Counseling done include healthy lifestyle involving committing to 150 minutes of exercise per week, heart healthy diet, and attaining healthy weight. The importance of adequate sleep also discussed.  ?Regular use of seat belt and home safety were also discussed . ?Changes in health habits are decided on by patient with goals and time frames set for achieving them. ?Immunization and cancer screening  needs are specifically addressed at this visit.   ?

## 2022-02-18 NOTE — Progress Notes (Signed)
? ?Complete physical exam ? ?Patient: [name]   DOB: 1981-07-08   41 y.o. Female  MRN: [id number] ? ?Subjective:  ?  ?Chief Complaint  ?Patient presents with  ? Annual Exam  ?  cpe  ? ? ?[name] is a 41 y.o. female with past medical history of dysmenorrhea, hyperlipidemia, obesity who presents today for a complete physical exam. She reports consuming a low fat and low sodium diet. Does a lot of walking about 2 hours daily .  She generally feels well. She reports sleeping fairly well.  ? ?Wears prescription glasses, last  eye exam is within the past one year, wears upper and lower dentures.  ? ?Had normal mammogram April 2023.  Up-to-date with Pap  ?Therefore Tdap vaccine need for vaccine discussed with patient patient encouraged to get vaccine at her pharmacy ? ?Most recent fall risk assessment: ? ?  02/18/2022  ? 11:01 AM  ?Fall Risk   ?Falls in the past year? 0  ?Number falls in past yr: 0  ?Injury with Fall? 0  ?Risk for fall due to : No Fall Risks  ?Follow up Falls evaluation completed  ? ?  ?Most recent depression screenings: ? ?  02/18/2022  ? 11:02 AM 12/17/2021  ? 11:34 AM  ?PHQ 2/9 Scores  ?PHQ - 2 Score 0 1  ? ? ? ? ? ? ?Patient Care Team: ?Renee Rival, FNP as PCP - General (Nurse Practitioner)  ? ?Outpatient Medications Prior to Visit  ?Medication Sig  ? citalopram (CELEXA) 40 MG tablet Take 1 tablet (40 mg total) by mouth daily.  ? fenofibrate (TRICOR) 145 MG tablet Take 1 tablet (145 mg total) by mouth daily.  ? hydrOXYzine (VISTARIL) 25 MG capsule Take 1 capsule (25 mg total) by mouth every 8 (eight) hours as needed.  ? meloxicam (MOBIC) 15 MG tablet TAKE 1 TABLET BY MOUTH ONCE DAILY AS NEEDED FOR PAIN  ? norethindrone (JENCYCLA) 0.35 MG tablet Take 1 tablet (0.35 mg total) by mouth daily.  ? pantoprazole (PROTONIX) 40 MG tablet Take 1 tablet (40 mg total) by mouth daily.  ? simvastatin (ZOCOR) 40 MG tablet Take 1 tablet (40 mg total) by mouth at bedtime.  ? [DISCONTINUED] megestrol  (MEGACE) 40 MG tablet TAKE 1 TABLET BY MOUTH THREE TIMES DAILY UNTIL BLEEDING STOPS, THEN TAKE 1 TABLET ONCE DAILY UNTIL FOLLOW UP  ? guaiFENesin (MUCINEX) 600 MG 12 hr tablet Take by mouth 2 (two) times daily. (Patient not taking: Reported on 12/17/2021)  ? IRON PO Take 1 tablet by mouth daily. (Patient not taking: Reported on 12/17/2021)  ? [DISCONTINUED] naproxen (NAPROSYN) 500 MG tablet Take 1 tablet (500 mg total) by mouth 2 (two) times daily with a meal. (Patient not taking: Reported on 02/18/2022)  ? ?No facility-administered medications prior to visit.  ? ? ?Review of Systems  ?Constitutional: Negative.   ?HENT: Negative.    ?Eyes: Negative.   ?Respiratory: Negative.    ?Cardiovascular: Negative.   ?Gastrointestinal: Negative.   ?Genitourinary: Negative.   ?Musculoskeletal: Negative.   ?Skin: Negative.   ?Neurological: Negative.   ?Endo/Heme/Allergies: Negative.   ?Psychiatric/Behavioral: Negative.    ? ? ? ? ?   ?Objective:  ? ?  ?BP 129/82 (BP Location: Right Arm, Patient Position: Sitting, Cuff Size: Large)   Pulse 73   Ht 5' 5"  (1.651 m)   Wt 269 lb (122 kg)   SpO2 99%   BMI 44.76 kg/m?  ? ?Breast exam deferred today ?  Physical Exam ?Vitals and nursing note reviewed.  ?Constitutional:   ?   General: She is not in acute distress. ?   Appearance: She is obese. She is not ill-appearing, toxic-appearing or diaphoretic.  ?HENT:  ?   Head: Normocephalic and atraumatic.  ?   Right Ear: Tympanic membrane, ear canal and external ear normal. There is no impacted cerumen.  ?   Left Ear: Tympanic membrane, ear canal and external ear normal. There is no impacted cerumen.  ?   Nose: Nose normal. No congestion or rhinorrhea.  ?   Mouth/Throat:  ?   Mouth: Mucous membranes are moist.  ?   Pharynx: Oropharynx is clear. No oropharyngeal exudate or posterior oropharyngeal erythema.  ?   Comments: Has upper and lower dentures ?Eyes:  ?   General: No scleral icterus.    ?   Right eye: No discharge.     ?   Left eye: No  discharge.  ?   Extraocular Movements: Extraocular movements intact.  ?   Conjunctiva/sclera: Conjunctivae normal.  ?   Pupils: Pupils are equal, round, and reactive to light.  ?Neck:  ?   Vascular: No carotid bruit.  ?Cardiovascular:  ?   Rate and Rhythm: Normal rate and regular rhythm.  ?   Pulses: Normal pulses.  ?   Heart sounds: Normal heart sounds. No murmur heard. ?  No friction rub. No gallop.  ?Pulmonary:  ?   Effort: Pulmonary effort is normal. No respiratory distress.  ?   Breath sounds: Normal breath sounds. No stridor. No wheezing, rhonchi or rales.  ?Chest:  ?   Chest wall: No tenderness.  ?Abdominal:  ?   General: There is no distension.  ?   Palpations: Abdomen is soft. There is no mass.  ?   Tenderness: There is no abdominal tenderness. There is no right CVA tenderness, left CVA tenderness, guarding or rebound.  ?   Hernia: No hernia is present.  ?Musculoskeletal:     ?   General: No swelling, tenderness, deformity or signs of injury. Normal range of motion.  ?   Cervical back: Normal range of motion and neck supple. No rigidity or tenderness.  ?   Right lower leg: No edema.  ?   Left lower leg: No edema.  ?Lymphadenopathy:  ?   Cervical: No cervical adenopathy.  ?Skin: ?   Capillary Refill: Capillary refill takes less than 2 seconds.  ?   Coloration: Skin is not jaundiced or pale.  ?   Findings: No bruising, erythema, lesion or rash.  ?Neurological:  ?   Mental Status: She is alert and oriented to person, place, and time.  ?   Cranial Nerves: No cranial nerve deficit.  ?   Sensory: No sensory deficit.  ?   Motor: No weakness.  ?   Coordination: Coordination normal.  ?   Gait: Gait normal.  ?   Deep Tendon Reflexes: Reflexes normal.  ?Psychiatric:     ?   Mood and Affect: Mood normal.     ?   Thought Content: Thought content normal.  ?  ? ?No results found for any visits on 02/18/22. ? ?   ?Assessment & Plan:  ?  ?Routine Health Maintenance and Physical Exam ? ?Immunization History  ?Administered  Date(s) Administered  ? Influenza Split 07/06/2015, 07/15/2016  ? Influenza,inj,Quad PF,6+ Mos 07/14/2014  ? ? ?Health Maintenance  ?Topic Date Due  ? TETANUS/TDAP  Never done  ? PAP SMEAR-Modifier  05/07/2024  ? Hepatitis C Screening  Completed  ? HIV Screening  Completed  ? HPV VACCINES  Aged Out  ? INFLUENZA VACCINE  Discontinued  ? COVID-19 Vaccine  Discontinued  ? ? ?Discussed health benefits of physical activity, and encouraged her to engage in regular exercise appropriate for her age and condition. ? ?Problem List Items Addressed This Visit   ? ?  ? Genitourinary  ? Dysmenorrhea  ?  Chronic condition ?Megace 96m once daily refilled, on norethindrone  0.35 mg tablets daily. ?Continue current medications ?Pt encouraged to follw up with OBGYN.  She verbalized understanding ? ?  ?  ?  ? Other  ? Hyperlipidemia  ?  Lab Results  ?Component Value Date  ? CHOL 123 02/10/2022  ? HDL 29 (L) 02/10/2022  ? [id number] 02/10/2022  ? TRIG 147 02/10/2022  ? CHOLHDL 4.2 02/10/2022  ?Chronic condition well-controlled on simvastatin 481mdaily 0, fenofibrate 145 mg daily continue current medications avoid fatty fried foods. ?  ?  ? Relevant Orders  ? Lipid Profile  ? CMP14+EGFR  ? Obesity, unspecified  ?  Wt Readings from Last 3 Encounters:  ?02/18/22 269 lb (122 kg)  ?01/03/22 261 lb (118.4 kg)  ?12/17/21 268 lb (121.6 kg)  ?Need to increase intake of whole food consisting mainly vegetables and protein less carbohydrate drinking at least 64 ounces of water daily, engaging in regular vigorous exercises at least 150 minutes weekly, importance of portion control also discussed. ?Benefits of healthy weight discussed with patient she verbalized understanding ?  ?  ? Vitamin D deficiency  ?  Last vitamin D ?Lab Results  ?Component Value Date  ? VD25OH 10.0 (L) 02/10/2022  ?Start vitamin D 50,000 units once weekly for 8 weeks, take vitamin D 1000 units daily after 8 weeks  ?  ?  ? Relevant Medications  ? Vitamin D, Ergocalciferol,  (DRISDOL) 1.25 MG (50000 UNIT) CAPS capsule  ? Annual physical exam - Primary  ?  Annual exam as documented.  ?Counseling done include healthy lifestyle involving committing to 150 minutes of exercise per week, heart heal

## 2022-03-01 ENCOUNTER — Other Ambulatory Visit: Payer: Self-pay | Admitting: Physician Assistant

## 2022-03-06 ENCOUNTER — Other Ambulatory Visit: Payer: Self-pay | Admitting: Physician Assistant

## 2022-03-13 ENCOUNTER — Other Ambulatory Visit: Payer: Self-pay | Admitting: Physician Assistant

## 2022-03-13 ENCOUNTER — Encounter: Payer: 59 | Admitting: Obstetrics & Gynecology

## 2022-03-14 ENCOUNTER — Other Ambulatory Visit: Payer: Self-pay | Admitting: Physician Assistant

## 2022-03-14 ENCOUNTER — Encounter: Payer: Self-pay | Admitting: Obstetrics & Gynecology

## 2022-03-14 ENCOUNTER — Ambulatory Visit: Payer: 59 | Admitting: Obstetrics & Gynecology

## 2022-03-14 VITALS — BP 122/80 | HR 65 | Wt 269.0 lb

## 2022-03-14 DIAGNOSIS — N946 Dysmenorrhea, unspecified: Secondary | ICD-10-CM | POA: Diagnosis not present

## 2022-03-14 DIAGNOSIS — N920 Excessive and frequent menstruation with regular cycle: Secondary | ICD-10-CM

## 2022-03-14 MED ORDER — MEGESTROL ACETATE 40 MG PO TABS: 40.0000 mg | ORAL_TABLET | Freq: Every day | ORAL | 11 refills | Status: DC

## 2022-03-14 MED ORDER — KETOROLAC TROMETHAMINE 10 MG PO TABS: 10.0000 mg | ORAL_TABLET | Freq: Three times a day (TID) | ORAL | 2 refills | Status: DC | PRN

## 2022-03-14 NOTE — Progress Notes (Signed)
Chief Complaint  Patient presents with   Megace refill    Heavy bleeding with clots if not taking the medication, wants hysterectomy      41 y.o. G2P2 No LMP recorded. (Menstrual status: Oral contraceptives). The current method of family planning is tubal ligation.  Outpatient Encounter Medications as of 03/14/2022  Medication Sig   citalopram (CELEXA) 40 MG tablet Take 1 tablet (40 mg total) by mouth daily.   fenofibrate (TRICOR) 145 MG tablet Take 1 tablet (145 mg total) by mouth daily.   IRON PO Take 1 tablet by mouth daily.   ketorolac (TORADOL) 10 MG tablet Take 1 tablet (10 mg total) by mouth every 8 (eight) hours as needed.   megestrol (MEGACE) 40 MG tablet Take megestrol 40 mg tablet daily   megestrol (MEGACE) 40 MG tablet Take 1 tablet (40 mg total) by mouth daily.   meloxicam (MOBIC) 15 MG tablet TAKE 1 TABLET BY MOUTH ONCE DAILY AS NEEDED FOR PAIN   norethindrone (JENCYCLA) 0.35 MG tablet Take 1 tablet (0.35 mg total) by mouth daily.   pantoprazole (PROTONIX) 40 MG tablet Take 1 tablet (40 mg total) by mouth daily.   simvastatin (ZOCOR) 40 MG tablet Take 1 tablet (40 mg total) by mouth at bedtime.   Vitamin D, Ergocalciferol, (DRISDOL) 1.25 MG (50000 UNIT) CAPS capsule Take 1 capsule (50,000 Units total) by mouth every 7 (seven) days.   hydrOXYzine (VISTARIL) 25 MG capsule Take 1 capsule (25 mg total) by mouth every 8 (eight) hours as needed. (Patient not taking: Reported on 03/14/2022)   [DISCONTINUED] guaiFENesin (MUCINEX) 600 MG 12 hr tablet Take by mouth 2 (two) times daily. (Patient not taking: Reported on 12/17/2021)   No facility-administered encounter medications on file as of 03/14/2022.    Subjective Menses 6 days since 15 or so Heavy, wears tampons sometimes + pads +soils  Past Medical History:  Diagnosis Date   Anxiety    Arthritis    Depression    Hypercholesteremia    Sinus infection 03/27/2021    Past Surgical History:  Procedure Laterality Date    BREAST BIOPSY Left 04/03/2021   Procedure: BREAST BIOPSY; excision of duct lesion;  Surgeon: Virl Cagey, MD;  Location: [facility];  Service: General;  Laterality: Left;   CHOLECYSTECTOMY N/A 07/13/2014   Procedure: LAPAROSCOPIC CHOLECYSTECTOMY;  Surgeon: Scherry Ran, MD;  Location: [facility];  Service: General;  Laterality: N/A;   DENTAL SURGERY  2018   plate top & bottom   TUBAL LIGATION      OB History     Gravida  2   Para  2   Term      Preterm      AB      Living  2      SAB      IAB      Ectopic      Multiple      Live Births              No Known Allergies  Social History   Socioeconomic History   Marital status: Married    Spouse name: Not on file   Number of children: 2   Years of education: Not on file   Highest education level: Not on file  Occupational History   Not on file  Tobacco Use   Smoking status: Former    Packs/day: 0.50    Years: 15.00    Pack years: 7.50  Types: Cigarettes    Quit date: 11/14/2019    Years since quitting: 2.3   Smokeless tobacco: Never  Vaping Use   Vaping Use: Never used  Substance and Sexual Activity   Alcohol use: No   Drug use: No   Sexual activity: Yes    Birth control/protection: Surgical, Pill  Other Topics Concern   Not on file  Social History Narrative   Lives with her spouse and children.    Social Determinants of Health   Financial Resource Strain: Low Risk    Difficulty of Paying Living Expenses: Not hard at all  Food Insecurity: No Food Insecurity   Worried About Charity fundraiser in the Last Year: Never true   City of [place] in the Last Year: Never true  Transportation Needs: No Transportation Needs   Lack of Transportation (Medical): No   Lack of Transportation (Non-Medical): No  Physical Activity: Insufficiently Active   Days of Exercise per Week: 5 days   Minutes of Exercise per Session: 20 min  Stress: No Stress Concern Present   Feeling of Stress : Only a  little  Social Connections: Moderately Isolated   Frequency of Communication with Friends and Family: Twice a week   Frequency of Social Gatherings with Friends and Family: Never   Attends Religious Services: 1 to 4 times per year   Active Member of [employer] or Organizations: No   Attends Music therapist: Never   Marital Status: Married    Family History  Problem Relation Age of Onset   Heart disease Mother    Diabetes Mother    Heart disease Father    Hypertension Father    Thyroid disease Father    Hyperlipidemia Father    Breast cancer Paternal Aunt    Cancer - Colon Paternal Uncle    Breast cancer Paternal Grandmother     Medications:       Current Outpatient Medications:    citalopram (CELEXA) 40 MG tablet, Take 1 tablet (40 mg total) by mouth daily., Disp: 30 tablet, Rfl: 3   fenofibrate (TRICOR) 145 MG tablet, Take 1 tablet (145 mg total) by mouth daily., Disp: 30 tablet, Rfl: 6   IRON PO, Take 1 tablet by mouth daily., Disp: , Rfl:    ketorolac (TORADOL) 10 MG tablet, Take 1 tablet (10 mg total) by mouth every 8 (eight) hours as needed., Disp: 15 tablet, Rfl: 2   megestrol (MEGACE) 40 MG tablet, Take megestrol 40 mg tablet daily, Disp: 45 tablet, Rfl: 0   megestrol (MEGACE) 40 MG tablet, Take 1 tablet (40 mg total) by mouth daily., Disp: 30 tablet, Rfl: 11   meloxicam (MOBIC) 15 MG tablet, TAKE 1 TABLET BY MOUTH ONCE DAILY AS NEEDED FOR PAIN, Disp: 30 tablet, Rfl: 1   norethindrone (JENCYCLA) 0.35 MG tablet, Take 1 tablet (0.35 mg total) by mouth daily., Disp: 84 tablet, Rfl: 3   pantoprazole (PROTONIX) 40 MG tablet, Take 1 tablet (40 mg total) by mouth daily., Disp: 30 tablet, Rfl: 6   simvastatin (ZOCOR) 40 MG tablet, Take 1 tablet (40 mg total) by mouth at bedtime., Disp: 30 tablet, Rfl: 6   Vitamin D, Ergocalciferol, (DRISDOL) 1.25 MG (50000 UNIT) CAPS capsule, Take 1 capsule (50,000 Units total) by mouth every 7 (seven) days., Disp: 8 capsule, Rfl: 0    hydrOXYzine (VISTARIL) 25 MG capsule, Take 1 capsule (25 mg total) by mouth every 8 (eight) hours as needed. (Patient not taking: Reported on 03/14/2022),  Disp: 30 capsule, Rfl: 0  Objective Blood pressure 122/80, pulse 65, weight 269 lb (122 kg).  General [facility]N female NAD Vulva:  normal appearing vulva with no masses, tenderness or lesions Vagina:  normal mucosa, no discharge Cervix:  Normal no lesions Uterus:  normal size, contour, position, consistency, mobility, non-tender Adnexa: ovaries:present,  normal adnexa in size, nontender and no masses   Pertinent ROS No burning with urination, frequency or urgency No nausea, vomiting or diarrhea Nor fever chills or other constitutional symptoms   Labs or studies Study Result  Narrative & Impression  CLINICAL DATA:  Patient with dysmenorrhea.   EXAM: ULTRASOUND PELVIS TRANSVAGINAL   TECHNIQUE: Transvaginal ultrasound examination of the pelvis was performed including evaluation of the uterus, ovaries, adnexal regions, and pelvic cul-de-sac.   COMPARISON:  None.   FINDINGS: Uterus   Measurements: 8.3 x 4.1 x 0.7 cm. No fibroids or other mass visualized.   Endometrium   Thickness: 5 mm.  No focal abnormality visualized.   Right ovary   Measurements: 2.8 x 2.2 x 2.4 cm. Normal appearance/no adnexal mass.   Left ovary   Measurements: 2.7 x 1 9 x 1.8 cm. Normal appearance/no adnexal mass.   Other findings:  No abnormal free fluid   IMPRESSION: No acute process pelvis.     Electronically Signed   By: Lovey Newcomer M.D.   On: 07/06/2018 11:35      Impression + Management Plan: Diagnoses this Encounter::   ICD-10-CM   1. Menorrhagia with regular cycle  N92.0    megace works well, will cycle 30 days on/4 days off to manage cycle but not continuous, plan to do endometrial ablation as well, 04/16/22    2. Dysmenorrhea  N94.6    2 day premenstrual cramps then tapers with her bleeding, toradol Rx         Medications prescribed during  this encounter: Meds ordered this encounter  Medications   megestrol (MEGACE) 40 MG tablet    Sig: Take 1 tablet (40 mg total) by mouth daily.    Dispense:  30 tablet    Refill:  11   ketorolac (TORADOL) 10 MG tablet    Sig: Take 1 tablet (10 mg total) by mouth every 8 (eight) hours as needed.    Dispense:  15 tablet    Refill:  2    Labs or Scans Ordered during this encounter: No orders of the defined types were placed in this encounter.     Follow up Return in about 6 weeks (around 04/25/2022) for [place], with Dr Elonda Husky.

## 2022-03-19 ENCOUNTER — Other Ambulatory Visit: Payer: Self-pay

## 2022-03-26 ENCOUNTER — Other Ambulatory Visit: Payer: Self-pay | Admitting: Nurse Practitioner

## 2022-03-26 DIAGNOSIS — E559 Vitamin D deficiency, unspecified: Secondary | ICD-10-CM

## 2022-03-28 ENCOUNTER — Other Ambulatory Visit: Payer: Self-pay | Admitting: Nurse Practitioner

## 2022-03-28 MED ORDER — SIMVASTATIN 40 MG PO TABS: 40.0000 mg | ORAL_TABLET | Freq: Every day | ORAL | 1 refills | Status: DC

## 2022-03-29 ENCOUNTER — Other Ambulatory Visit: Payer: Self-pay | Admitting: Physician Assistant

## 2022-04-04 ENCOUNTER — Other Ambulatory Visit: Payer: Self-pay | Admitting: Physician Assistant

## 2022-04-08 ENCOUNTER — Other Ambulatory Visit: Payer: Self-pay | Admitting: Nurse Practitioner

## 2022-04-08 DIAGNOSIS — F419 Anxiety disorder, unspecified: Secondary | ICD-10-CM

## 2022-04-14 ENCOUNTER — Other Ambulatory Visit: Payer: Self-pay | Admitting: Obstetrics & Gynecology

## 2022-04-14 ENCOUNTER — Encounter (HOSPITAL_COMMUNITY)
Admission: RE | Admit: 2022-04-14 | Discharge: 2022-04-14 | Disposition: A | Discharge status: Home / Self Care | Payer: 59 | Source: Ambulatory Visit | Attending: Obstetrics & Gynecology | Admitting: Obstetrics & Gynecology

## 2022-04-14 VITALS — BP 150/73 | HR 97 | Temp 98.0°F | Ht 66.0 in | Wt 265.0 lb

## 2022-04-14 DIAGNOSIS — Z01818 Encounter for other preprocedural examination: Secondary | ICD-10-CM | POA: Diagnosis present

## 2022-04-14 LAB — CBC
HCT: 36.2 % (ref 36.0–46.0)
Hemoglobin: 12 g/dL (ref 12.0–15.0)
MCH: 29.1 pg (ref 26.0–34.0)
MCHC: 33.1 g/dL (ref 30.0–36.0)
MCV: 87.7 fL (ref 80.0–100.0)
Platelets: 288 10*3/uL (ref 150–400)
RBC: 4.13 MIL/uL (ref 3.87–5.11)
RDW: 13.9 % (ref 11.5–15.5)
WBC: 5.3 10*3/uL (ref 4.0–10.5)
nRBC: 0 % (ref 0.0–0.2)

## 2022-04-14 LAB — RAPID HIV SCREEN (HIV 1/2 AB+AG)
HIV 1/2 Antibodies: NONREACTIVE
HIV-1 P24 Antigen - HIV24: NONREACTIVE

## 2022-04-14 LAB — URINALYSIS, ROUTINE W REFLEX MICROSCOPIC
Bilirubin Urine: NEGATIVE
Glucose, UA: NEGATIVE mg/dL
Hgb urine dipstick: NEGATIVE
Ketones, ur: NEGATIVE mg/dL
Nitrite: NEGATIVE
Protein, ur: 30 mg/dL — AB
Specific Gravity, Urine: 1.023 (ref 1.005–1.030)
pH: 5 (ref 5.0–8.0)

## 2022-04-14 LAB — COMPREHENSIVE METABOLIC PANEL
ALT: 48 U/L — ABNORMAL HIGH (ref 0–44)
AST: 38 U/L (ref 15–41)
Albumin: 4.3 g/dL (ref 3.5–5.0)
Alkaline Phosphatase: 46 U/L (ref 38–126)
Anion gap: 7 (ref 5–15)
BUN: 16 mg/dL (ref 6–20)
CO2: 20 mmol/L — ABNORMAL LOW (ref 22–32)
Calcium: 9.4 mg/dL (ref 8.9–10.3)
Chloride: 110 mmol/L (ref 98–111)
Creatinine, Ser: 1.1 mg/dL — ABNORMAL HIGH (ref 0.44–1.00)
GFR, Estimated: >60 mL/min (ref >60)
Glucose, Bld: 121 mg/dL — ABNORMAL HIGH (ref 70–99)
Potassium: 3.7 mmol/L (ref 3.5–5.1)
Sodium: 137 mmol/L (ref 135–145)
Total Bilirubin: 0.7 mg/dL (ref 0.3–1.2)
Total Protein: 7.6 g/dL (ref 6.5–8.1)

## 2022-04-14 LAB — PREGNANCY, URINE: Preg Test, Ur: NEGATIVE

## 2022-04-14 MED ORDER — KETOROLAC TROMETHAMINE 30 MG/ML IJ SOLN: 30.0000 mg | Freq: Once | INTRAMUSCULAR | Status: AC

## 2022-04-14 NOTE — Patient Instructions (Signed)
Your procedure is scheduled on: 04/16/2022  Report to [facility] Main Entrance at  8:15   AM.  Call this number if you have problems the morning of surgery: [phone]   Remember:   Do not Eat after midnight. You may drink CLEAR liquids until 6:30 am.  Drink Carb loading drink before 6:30 am.        No Smoking the morning of surgery  :  Take these medicines the morning of surgery with A SIP OF WATER: Celexa and pantoprazole   Do not wear jewelry, make-up or nail polish.  Do not wear lotions, powders, or perfumes. You may wear deodorant.  Do not shave 48 hours prior to surgery. Men may shave face and neck.  Do not bring valuables to the hospital.  Contacts, dentures or bridgework may not be worn into surgery.  Leave suitcase in the car. After surgery it may be brought to your room.  For patients admitted to the hospital, checkout time is 11:00 AM the day of discharge.   Patients discharged the day of surgery will not be allowed to drive home.    Special Instructions: Shower using CHG night before surgery and shower the day of surgery use CHG.  Use special wash - you have one bottle of CHG for all showers.  You should use approximately 1/2 of the bottle for each shower.  How to Use Chlorhexidine for Bathing Chlorhexidine gluconate (CHG) is a germ-killing (antiseptic) solution that is used to clean the skin. It can get rid of the bacteria that normally live on the skin and can keep them away for about 24 hours. To clean your skin with CHG, you may be given: A CHG solution to use in the shower or as part of a sponge bath. A prepackaged cloth that contains CHG. Cleaning your skin with CHG may help lower the risk for infection: While you are staying in the intensive care unit of the hospital. If you have a vascular access, such as a central line, to provide short-term or long-term access to your veins. If you have a catheter to drain urine from your bladder. If you are on a ventilator. A  ventilator is a machine that helps you breathe by moving air in and out of your lungs. After surgery. What are the risks? Risks of using CHG include: A skin reaction. Hearing loss, if CHG gets in your ears and you have a perforated eardrum. Eye injury, if CHG gets in your eyes and is not rinsed out. The CHG product catching fire. Make sure that you avoid smoking and flames after applying CHG to your skin. Do not use CHG: If you have a chlorhexidine allergy or have previously reacted to chlorhexidine. On babies younger than 58 months of age. How to use CHG solution Use CHG only as told by your health care provider, and follow the instructions on the label. Use the full amount of CHG as directed. Usually, this is one bottle. During a shower Follow these steps when using CHG solution during a shower (unless your health care provider gives you different instructions): Start the shower. Use your normal soap and shampoo to wash your face and hair. Turn off the shower or move out of the shower stream. Pour the CHG onto a clean washcloth. Do not use any type of brush or rough-edged sponge. Starting at your neck, lather your body down to your toes. Make sure you follow these instructions: If you will be having surgery, pay  special attention to the part of your body where you will be having surgery. Scrub this area for at least 1 minute. Do not use CHG on your head or face. If the solution gets into your ears or eyes, rinse them well with water. Avoid your genital area. Avoid any areas of skin that have broken skin, cuts, or scrapes. Scrub your back and under your arms. Make sure to wash skin folds. Let the lather sit on your skin for 1-2 minutes or as long as told by your health care provider. Thoroughly rinse your entire body in the shower. Make sure that all body creases and crevices are rinsed well. Dry off with a clean towel. Do not put any substances on your body afterward--such as powder,  lotion, or perfume--unless you are told to do so by your health care provider. Only use lotions that are recommended by the manufacturer. Put on clean clothes or pajamas. If it is the night before your surgery, sleep in clean sheets.  During a sponge bath Follow these steps when using CHG solution during a sponge bath (unless your health care provider gives you different instructions): Use your normal soap and shampoo to wash your face and hair. Pour the CHG onto a clean washcloth. Starting at your neck, lather your body down to your toes. Make sure you follow these instructions: If you will be having surgery, pay special attention to the part of your body where you will be having surgery. Scrub this area for at least 1 minute. Do not use CHG on your head or face. If the solution gets into your ears or eyes, rinse them well with water. Avoid your genital area. Avoid any areas of skin that have broken skin, cuts, or scrapes. Scrub your back and under your arms. Make sure to wash skin folds. Let the lather sit on your skin for 1-2 minutes or as long as told by your health care provider. Using a different clean, wet washcloth, thoroughly rinse your entire body. Make sure that all body creases and crevices are rinsed well. Dry off with a clean towel. Do not put any substances on your body afterward--such as powder, lotion, or perfume--unless you are told to do so by your health care provider. Only use lotions that are recommended by the manufacturer. Put on clean clothes or pajamas. If it is the night before your surgery, sleep in clean sheets. How to use CHG prepackaged cloths Only use CHG cloths as told by your health care provider, and follow the instructions on the label. Use the CHG cloth on clean, dry skin. Do not use the CHG cloth on your head or face unless your health care provider tells you to. When washing with the CHG cloth: Avoid your genital area. Avoid any areas of skin that have  broken skin, cuts, or scrapes. Before surgery Follow these steps when using a CHG cloth to clean before surgery (unless your health care provider gives you different instructions): Using the CHG cloth, vigorously scrub the part of your body where you will be having surgery. Scrub using a back-and-forth motion for 3 minutes. The area on your body should be completely wet with CHG when you are done scrubbing. Do not rinse. Discard the cloth and let the area air-dry. Do not put any substances on the area afterward, such as powder, lotion, or perfume. Put on clean clothes or pajamas. If it is the night before your surgery, sleep in clean sheets.  For general bathing  Follow these steps when using CHG cloths for general bathing (unless your health care provider gives you different instructions). Use a separate CHG cloth for each area of your body. Make sure you wash between any folds of skin and between your fingers and toes. Wash your body in the following order, switching to a new cloth after each step: The front of your neck, shoulders, and chest. Both of your arms, under your arms, and your hands. Your stomach and groin area, avoiding the genitals. Your right leg and foot. Your left leg and foot. The back of your neck, your back, and your buttocks. Do not rinse. Discard the cloth and let the area air-dry. Do not put any substances on your body afterward--such as powder, lotion, or perfume--unless you are told to do so by your health care provider. Only use lotions that are recommended by the manufacturer. Put on clean clothes or pajamas. Contact a health care provider if: Your skin gets irritated after scrubbing. You have questions about using your solution or cloth. You swallow any chlorhexidine. Call your local poison control center ([phone] in the [place] Get help right away if: Your eyes itch badly, or they become very red or swollen. Your skin itches badly and is red or  swollen. Your hearing changes. You have trouble seeing. You have swelling or tingling in your mouth or throat. You have trouble breathing. These symptoms may represent a serious problem that is an emergency. Do not wait to see if the symptoms will go away. Get medical help right away. Call your local emergency services (911 in the [place] Do not drive yourself to the hospital. Summary Chlorhexidine gluconate (CHG) is a germ-killing (antiseptic) solution that is used to clean the skin. Cleaning your skin with CHG may help to lower your risk for infection. You may be given CHG to use for bathing. It may be in a bottle or in a prepackaged cloth to use on your skin. Carefully follow your health care provider's instructions and the instructions on the product label. Do not use CHG if you have a chlorhexidine allergy. Contact your health care provider if your skin gets irritated after scrubbing. This information is not intended to replace advice given to you by your health care provider. Make sure you discuss any questions you have with your health care provider. Document Revised: 12/10/2020 Document Reviewed: 12/10/2020 Elsevier Patient Education  2023 [employer].                                                                             Curettage, Care After The following information offers guidance on how to care for yourself after your procedure. Your doctor may also give you more specific instructions. If you have problems or questions, contact your doctor. What can I expect after the procedure? After the procedure, it is common to have: Mild pain or cramps. Some bleeding or spotting from the vagina. These may last for up to 2 weeks. Follow these instructions at home: Medicines Take over-the-counter and prescription medicines only as told by your doctor. If told, take steps to prevent problems with pooping (constipation). You may need to: Drink enough fluid to keep your pee (urine) pale  yellow. Take medicines. You will be told what medicines to take. Eat foods that are high in fiber. These include beans, whole grains, and fresh fruits and vegetables. Limit foods that are high in fat and sugar. These include fried or sweet foods. Ask your doctor if you should avoid driving or using machines while you are taking your medicine. Activity  If you were given a medicine to help you relax (sedative) during your procedure, it can affect you for many hours. Do not drive or use machinery until your doctor says that it is safe. Rest as told by your doctor. Get up to take short walks every 1-2 hours. Ask for help if you feel weak or unsteady. Do not lift anything that is heavier than 10 lb (4.5 kg), or the limit that you are told. Return to your normal activities when your doctor says that it is safe. Lifestyle For at least 2 weeks, or as long as told by your doctor: Do not douche. Do not use tampons. Do not have sex. General instructions Do not take baths, swim, or use a hot tub. Ask your doctor if you may take showers. Do not smoke or use any products that contain nicotine or tobacco. These can delay healing. If you need help quitting, ask your doctor. Wear compression stockings as told by your doctor. It is up to you to get the results of your procedure. Ask how to get your results when they are ready. Keep all follow-up visits. Contact a doctor if: You have very bad cramps that get worse or do not get better with medicine. You have very bad pain in your belly (abdomen). You cannot drink fluids without vomiting. You have pain in the area just above your thighs. You have fluid from your vagina that smells bad. You have a rash. Get help right away if: You are bleeding a lot from your vagina. This means soaking more than one sanitary pad in 1 hour, and this happens for 2 hours in a row. You have a fever that is above 100.49F (38C). Your belly feels very tender or hard. You  have chest pain. You have trouble breathing. You feel dizzy or light-headed. You faint. You have pain in your neck or shoulder area. These symptoms may be an emergency. Get help right away. Call your local emergency services (911 in the [place] Do not wait to see if the symptoms will go away. Do not drive yourself to the hospital. Summary After your procedure, it is common to have pain or cramping. It is also common to have bleeding or spotting from your vagina. Rest as told. Get up to take short walks every 1-2 hours. Do not lift anything that is heavier than 10 lb (4.5 kg), or the limit that you are told. Get help right away if you have problems from the procedure. Ask your doctor what problems to watch for. This information is not intended to replace advice given to you by your health care provider. Make sure you discuss any questions you have with your health care provider. Document Revised: 09/19/2020 Document Reviewed: 09/19/2020 Elsevier Patient Education  2023 [employer] Endometrial Ablation Endometrial ablation is a procedure that destroys the thin inner layer of the lining of the uterus (endometrium). This procedure may be done: To stop heavy menstrual periods. To stop bleeding that is causing anemia. To control irregular bleeding. To treat bleeding caused by small tumors (fibroids) in the endometrium. This procedure is often done as an alternative  to major surgery, such as removal of the uterus and cervix (hysterectomy). As a result of this procedure: You may not be able to have children. However, if you have not yet gone through menopause: You may still have a small chance of getting pregnant. You will need to use a reliable method of birth control after the procedure to prevent pregnancy. You may stop having a menstrual period, or you may have only a small amount of bleeding during your period. Menstruation may return several years after the procedure. Tell a health care  provider about: Any allergies you have. All medicines you are taking, including vitamins, herbs, eye drops, creams, and over-the-counter medicines. Any problems you or family members have had with the use of anesthetic medicines. Any blood disorders you have. Any surgeries you have had. Any medical conditions you have. Whether you are pregnant or may be pregnant. What are the risks? Generally, this is a safe procedure. However, problems may occur, including: A hole (perforation) in the uterus or bowel. Infection in the uterus, bladder, or vagina. Bleeding. Allergic reaction to medicines. Damage to nearby structures or organs. An air bubble in the lung (air embolus). Problems with pregnancy. Failure of the procedure. Decreased ability to diagnose cancer in the endometrium. Scar tissue forms after the procedure, making it more difficult to get a sample of the uterine lining. What happens before the procedure? Medicines Ask your health care provider about: Changing or stopping your regular medicines. This is especially important if you take diabetes medicines or blood thinners. Taking medicines such as aspirin and ibuprofen. These medicines can thin your blood. Do not take these medicines before your procedure if your doctor tells you not to take them. Taking over-the-counter medicines, vitamins, herbs, and supplements. Tests You will have tests of your endometrium to make sure there are no precancerous cells or cancer cells present. You may have an ultrasound of the uterus. General instructions Do not use any products that contain nicotine or tobacco for at least 4 weeks before the procedure. These include cigarettes, chewing tobacco, and vaping devices, such as e-cigarettes. If you need help quitting, ask your health care provider. You may be given medicines to thin the endometrium. Ask your health care provider what steps will be taken to help prevent infection. These steps may  include: Removing hair at the surgery site. Washing skin with a germ-killing soap. Taking antibiotic medicine. Plan to have a responsible adult take you home from the hospital or clinic. Plan to have a responsible adult care for you for the time you are told after you leave the hospital or clinic. This is important. What happens during the procedure?  You will lie on an exam table with your feet and legs supported as in a pelvic exam. An IV will be inserted into one of your veins. You will be given a medicine to help you relax (sedative). A surgical tool with a light and camera (resectoscope) will be inserted into your vagina and moved into your uterus. This allows your surgeon to see inside your uterus. Endometrial tissue will be destroyed and removed, using one of the following methods: Radiofrequency. This uses an electrical current to destroy the endometrium. Cryotherapy. This uses extreme cold to freeze the endometrium. Heated fluid. This uses a heated salt and water (saline) solution to destroy the endometrium. Microwave. This uses high-energy microwaves to heat up the endometrium and destroy it. Thermal balloon. This involves inserting a catheter with a balloon tip into the uterus.  The balloon tip is filled with heated fluid to destroy the endometrium. The procedure may vary among health care providers and hospitals. What happens after the procedure? Your blood pressure, heart rate, breathing rate, and blood oxygen level will be monitored until you leave the hospital or clinic. You may have vaginal bleeding for 4-6 weeks after the procedure. You may also have: Cramps. A thin, watery vaginal discharge that is light pink or brown. A need to urinate more than usual. Nausea. If you were given a sedative during the procedure, it can affect you for several hours. Do not drive or operate machinery until your health care provider says that it is safe. Do not have sex or insert anything into  your vagina until your health care provider says it is safe. Summary Endometrial ablation is done to treat many causes of heavy menstrual bleeding. The procedure destroys the thin inner layer of the lining of the uterus (endometrium). This procedure is often done as an alternative to major surgery, such as removal of the uterus and cervix (hysterectomy). Plan to have a responsible adult take you home from the hospital or clinic. This information is not intended to replace advice given to you by your health care provider. Make sure you discuss any questions you have with your health care provider. Document Revised: 04/19/2020 Document Reviewed: 04/19/2020 Elsevier Patient Education  2023 [employer]. General Anesthesia, Adult, Care After This sheet gives you information about how to care for yourself after your procedure. Your health care provider may also give you more specific instructions. If you have problems or questions, contact your health care provider. What can I expect after the procedure? After the procedure, the following side effects are common: Pain or discomfort at the IV site. Nausea. Vomiting. Sore throat. Trouble concentrating. Feeling cold or chills. Feeling weak or tired. Sleepiness and fatigue. Soreness and body aches. These side effects can affect parts of the body that were not involved in surgery. Follow these instructions at home: For the time period you were told by your health care provider:  Rest. Do not participate in activities where you could fall or become injured. Do not drive or use machinery. Do not drink alcohol. Do not take sleeping pills or medicines that cause drowsiness. Do not make important decisions or sign legal documents. Do not take care of children on your own. Eating and drinking Follow any instructions from your health care provider about eating or drinking restrictions. When you feel hungry, start by eating small amounts of foods  that are soft and easy to digest (bland), such as toast. Gradually return to your regular diet. Drink enough fluid to keep your urine pale yellow. If you vomit, rehydrate by drinking water, juice, or clear broth. General instructions If you have sleep apnea, surgery and certain medicines can increase your risk for breathing problems. Follow instructions from your health care provider about wearing your sleep device: Anytime you are sleeping, including during daytime naps. While taking prescription pain medicines, sleeping medicines, or medicines that make you drowsy. Have a responsible adult stay with you for the time you are told. It is important to have someone help care for you until you are awake and alert. Return to your normal activities as told by your health care provider. Ask your health care provider what activities are safe for you. Take over-the-counter and prescription medicines only as told by your health care provider. If you smoke, do not smoke without supervision. Keep all follow-up visits  as told by your health care provider. This is important. Contact a health care provider if: You have nausea or vomiting that does not get better with medicine. You cannot eat or drink without vomiting. You have pain that does not get better with medicine. You are unable to pass urine. You develop a skin rash. You have a fever. You have redness around your IV site that gets worse. Get help right away if: You have difficulty breathing. You have chest pain. You have blood in your urine or stool, or you vomit blood. Summary After the procedure, it is common to have a sore throat or nausea. It is also common to feel tired. Have a responsible adult stay with you for the time you are told. It is important to have someone help care for you until you are awake and alert. When you feel hungry, start by eating small amounts of foods that are soft and easy to digest (bland), such as toast. Gradually  return to your regular diet. Drink enough fluid to keep your urine pale yellow. Return to your normal activities as told by your health care provider. Ask your health care provider what activities are safe for you. This information is not intended to replace advice given to you by your health care provider. Make sure you discuss any questions you have with your health care provider. Document Revised: 06/14/2020 Document Reviewed: 01/12/2020 Elsevier Patient Education  2023 [employer].

## 2022-04-16 ENCOUNTER — Ambulatory Visit (HOSPITAL_COMMUNITY)
Admission: RE | Admit: 2022-04-16 | Discharge: 2022-04-16 | Disposition: A | Discharge status: Home / Self Care | Payer: 59 | Attending: Obstetrics & Gynecology | Admitting: Obstetrics & Gynecology

## 2022-04-16 ENCOUNTER — Other Ambulatory Visit: Payer: Self-pay

## 2022-04-16 ENCOUNTER — Ambulatory Visit (HOSPITAL_BASED_OUTPATIENT_CLINIC_OR_DEPARTMENT_OTHER): Payer: 59 | Admitting: Certified Registered"

## 2022-04-16 ENCOUNTER — Encounter (HOSPITAL_COMMUNITY): Payer: Self-pay | Admitting: Obstetrics & Gynecology

## 2022-04-16 ENCOUNTER — Encounter (HOSPITAL_COMMUNITY)
Admission: RE | Disposition: A | Discharge status: Home / Self Care | Payer: Self-pay | Source: Home / Self Care | Attending: Obstetrics & Gynecology

## 2022-04-16 ENCOUNTER — Ambulatory Visit (HOSPITAL_COMMUNITY): Payer: 59 | Admitting: Certified Registered"

## 2022-04-16 DIAGNOSIS — Z6841 Body Mass Index (BMI) 40.0 and over, adult: Secondary | ICD-10-CM | POA: Diagnosis not present

## 2022-04-16 DIAGNOSIS — N920 Excessive and frequent menstruation with regular cycle: Secondary | ICD-10-CM | POA: Diagnosis present

## 2022-04-16 DIAGNOSIS — N946 Dysmenorrhea, unspecified: Secondary | ICD-10-CM | POA: Diagnosis not present

## 2022-04-16 DIAGNOSIS — K219 Gastro-esophageal reflux disease without esophagitis: Secondary | ICD-10-CM | POA: Diagnosis not present

## 2022-04-16 DIAGNOSIS — Z87891 Personal history of nicotine dependence: Secondary | ICD-10-CM | POA: Diagnosis not present

## 2022-04-16 HISTORY — PX: DILATATION AND CURETTAGE/HYSTEROSCOPY WITH MINERVA: SHX6851

## 2022-04-16 SURGERY — DILATATION AND CURETTAGE/HYSTEROSCOPY WITH MINERVA
Anesthesia: General | Site: Vagina

## 2022-04-16 MED ORDER — ROCURONIUM BROMIDE 10 MG/ML (PF) SYRINGE
PREFILLED_SYRINGE | INTRAVENOUS | Status: AC
  Filled 2022-04-16: qty 20

## 2022-04-16 MED ORDER — MIDAZOLAM HCL 5 MG/5ML IJ SOLN
INTRAMUSCULAR | Status: DC | PRN
  Administered 2022-04-16: 2 mg via INTRAVENOUS

## 2022-04-16 MED ORDER — PROPOFOL 10 MG/ML IV BOLUS
INTRAVENOUS | Status: AC
  Filled 2022-04-16: qty 20

## 2022-04-16 MED ORDER — PROPOFOL 10 MG/ML IV BOLUS
INTRAVENOUS | Status: DC | PRN
  Administered 2022-04-16: 200 mg via INTRAVENOUS

## 2022-04-16 MED ORDER — ONDANSETRON 8 MG PO TBDP: 8.0000 mg | ORAL_TABLET | Freq: Three times a day (TID) | ORAL | 0 refills | Status: DC | PRN

## 2022-04-16 MED ORDER — FENTANYL CITRATE (PF) 100 MCG/2ML IJ SOLN
INTRAMUSCULAR | Status: AC
  Filled 2022-04-16: qty 2

## 2022-04-16 MED ORDER — ONDANSETRON HCL 4 MG/2ML IJ SOLN
INTRAMUSCULAR | Status: DC | PRN
  Administered 2022-04-16: 4 mg via INTRAVENOUS

## 2022-04-16 MED ORDER — 0.9 % SODIUM CHLORIDE (POUR BTL) OPTIME
TOPICAL | Status: DC | PRN
  Administered 2022-04-16: 1000 mL

## 2022-04-16 MED ORDER — KETOROLAC TROMETHAMINE 30 MG/ML IJ SOLN
INTRAMUSCULAR | Status: AC
  Filled 2022-04-16: qty 1

## 2022-04-16 MED ORDER — DEXAMETHASONE SODIUM PHOSPHATE 10 MG/ML IJ SOLN
INTRAMUSCULAR | Status: DC | PRN
  Administered 2022-04-16: 10 mg via INTRAVENOUS

## 2022-04-16 MED ORDER — DEXAMETHASONE SODIUM PHOSPHATE 10 MG/ML IJ SOLN
INTRAMUSCULAR | Status: AC
  Filled 2022-04-16: qty 1

## 2022-04-16 MED ORDER — FENTANYL CITRATE (PF) 250 MCG/5ML IJ SOLN
INTRAMUSCULAR | Status: DC | PRN
  Administered 2022-04-16: 100 ug via INTRAVENOUS

## 2022-04-16 MED ORDER — SUGAMMADEX SODIUM 500 MG/5ML IV SOLN
INTRAVENOUS | Status: AC
  Filled 2022-04-16: qty 10

## 2022-04-16 MED ORDER — POVIDONE-IODINE 10 % EX SWAB: 2.0000 | Freq: Once | CUTANEOUS | Status: DC

## 2022-04-16 MED ORDER — SUGAMMADEX SODIUM 200 MG/2ML IV SOLN
INTRAVENOUS | Status: DC | PRN
  Administered 2022-04-16: 400 mg via INTRAVENOUS

## 2022-04-16 MED ORDER — KETOROLAC TROMETHAMINE 30 MG/ML IJ SOLN
30.0000 mg | Freq: Once | INTRAMUSCULAR | Status: AC
  Administered 2022-04-16: 30 mg via INTRAVENOUS

## 2022-04-16 MED ORDER — MIDAZOLAM HCL 2 MG/2ML IJ SOLN
INTRAMUSCULAR | Status: AC
  Filled 2022-04-16: qty 2

## 2022-04-16 MED ORDER — SODIUM CHLORIDE 0.9 % IR SOLN
Status: DC | PRN
  Administered 2022-04-16: 1000 mL

## 2022-04-16 MED ORDER — ONDANSETRON HCL 4 MG/2ML IJ SOLN
INTRAMUSCULAR | Status: AC
  Filled 2022-04-16: qty 2

## 2022-04-16 MED ORDER — ROCURONIUM BROMIDE 10 MG/ML (PF) SYRINGE
PREFILLED_SYRINGE | INTRAVENOUS | Status: DC | PRN
  Administered 2022-04-16: 70 mg via INTRAVENOUS

## 2022-04-16 MED ORDER — CEFAZOLIN SODIUM-DEXTROSE 2-4 GM/100ML-% IV SOLN
2.0000 g | INTRAVENOUS | Status: AC
  Administered 2022-04-16: 2 g via INTRAVENOUS

## 2022-04-16 MED ORDER — LACTATED RINGERS IV SOLN: INTRAVENOUS | Status: DC | PRN

## 2022-04-16 MED ORDER — HYDROCODONE-ACETAMINOPHEN 5-325 MG PO TABS: 1.0000 | ORAL_TABLET | Freq: Four times a day (QID) | ORAL | 0 refills | Status: DC | PRN

## 2022-04-16 MED ORDER — CEFAZOLIN SODIUM-DEXTROSE 2-4 GM/100ML-% IV SOLN
INTRAVENOUS | Status: AC
  Filled 2022-04-16: qty 100

## 2022-04-16 SURGICAL SUPPLY — 27 items
BAG HAMPER (MISCELLANEOUS) ×2 IMPLANT
CLOTH BEACON ORANGE TIMEOUT ST (SAFETY) ×2 IMPLANT
COVER LIGHT HANDLE STERIS (MISCELLANEOUS) ×4 IMPLANT
GAUZE 4X4 16PLY ~~LOC~~+RFID DBL (SPONGE) ×4 IMPLANT
GLOVE BIOGEL PI IND STRL 7.0 (GLOVE) ×2 IMPLANT
GLOVE BIOGEL PI IND STRL 8 (GLOVE) ×1 IMPLANT
GLOVE BIOGEL PI INDICATOR 7.0 (GLOVE) ×2
GLOVE BIOGEL PI INDICATOR 8 (GLOVE) ×1
GLOVE ECLIPSE 8.0 STRL XLNG CF (GLOVE) ×4 IMPLANT
GOWN STRL REUS W/TWL LRG LVL3 (GOWN DISPOSABLE) ×2 IMPLANT
GOWN STRL REUS W/TWL XL LVL3 (GOWN DISPOSABLE) ×2 IMPLANT
HANDPIECE ABLA MINERVA ENDO (MISCELLANEOUS) ×2 IMPLANT
INST SET HYSTEROSCOPY (KITS) ×2 IMPLANT
IV NS 1000ML (IV SOLUTION) ×2
IV NS 1000ML BAXH (IV SOLUTION) ×1 IMPLANT
KIT TURNOVER CYSTO (KITS) ×2 IMPLANT
MANIFOLD NEPTUNE II (INSTRUMENTS) ×2 IMPLANT
MARKER SKIN DUAL TIP RULER LAB (MISCELLANEOUS) ×2 IMPLANT
NS IRRIG 1000ML POUR BTL (IV SOLUTION) ×2 IMPLANT
PACK BASIC III (CUSTOM PROCEDURE TRAY) ×2
PACK SRG BSC III STRL LF ECLPS (CUSTOM PROCEDURE TRAY) ×1 IMPLANT
PAD ARMBOARD 7.5X6 YLW CONV (MISCELLANEOUS) ×2 IMPLANT
PAD TELFA 3X4 1S STER (GAUZE/BANDAGES/DRESSINGS) ×2 IMPLANT
SET BASIN LINEN APH (SET/KITS/TRAYS/PACK) ×2 IMPLANT
SET CYSTO W/LG BORE CLAMP LF (SET/KITS/TRAYS/PACK) ×2 IMPLANT
SHEET LAVH (DRAPES) ×2 IMPLANT
TUBE CONNECTING 12X1/4 (SUCTIONS) ×2 IMPLANT

## 2022-04-16 NOTE — Transfer of Care (Signed)
Immediate Anesthesia Transfer of Care Note  Patient: [name]  Procedure(s) Performed: DILATATION AND CURETTAGE/HYSTEROSCOPY WITH MINERVA (Vagina )  Patient Location: PACU  Anesthesia Type:General  Level of Consciousness: awake, alert  and oriented  Airway & Oxygen Therapy: Patient Spontanous Breathing and Patient connected to nasal cannula oxygen  Post-op Assessment: Report given to RN, Post -op Vital signs reviewed and stable and Patient moving all extremities  Post vital signs: Reviewed and stable  Last Vitals:  Vitals Value Taken Time  BP 109/62 04/16/22 1145  Temp    Pulse 79 04/16/22 1146  Resp 16 04/16/22 1146  SpO2 95 % 04/16/22 1146  Vitals shown include unvalidated device data.  Last Pain:  Vitals:   04/16/22 0913  TempSrc: Oral  PainSc: 0-No pain         Complications: No notable events documented.

## 2022-04-16 NOTE — Anesthesia Procedure Notes (Signed)
Procedure Name: Intubation Date/Time: 04/16/2022 11:01 AM  Performed by: Lucinda Dell, CRNAPre-anesthesia Checklist: Patient identified, Emergency Drugs available, Suction available, Patient being monitored and Timeout performed Patient Re-evaluated:Patient Re-evaluated prior to induction Oxygen Delivery Method: Circle system utilized Preoxygenation: Pre-oxygenation with 100% oxygen Induction Type: IV induction Ventilation: Mask ventilation with difficulty Laryngoscope Size: Miller and 3 Grade View: Grade I Tube type: Oral Number of attempts: 1 Airway Equipment and Method: Stylet Placement Confirmation: ETT inserted through vocal cords under direct vision, positive ETCO2, CO2 detector and breath sounds checked- equal and bilateral Secured at: 21 cm Dental Injury: Teeth and Oropharynx as per pre-operative assessment

## 2022-04-16 NOTE — Anesthesia Postprocedure Evaluation (Signed)
Anesthesia Post Note  Patient: [name]  Procedure(s) Performed: DILATATION AND CURETTAGE/HYSTEROSCOPY WITH MINERVA (Vagina )  Patient location during evaluation: Phase II Anesthesia Type: General Level of consciousness: awake and alert and oriented Pain management: pain level controlled Vital Signs Assessment: post-procedure vital signs reviewed and stable Respiratory status: spontaneous breathing, nonlabored ventilation and respiratory function stable Cardiovascular status: blood pressure returned to baseline and stable Postop Assessment: no apparent nausea or vomiting Anesthetic complications: no   No notable events documented.   Last Vitals:  Vitals:   04/16/22 1215 04/16/22 1232  BP: 113/69 112/70  Pulse: 79 76  Resp: 18 20  Temp:  37.1 C  SpO2: 92% 95%    Last Pain:  Vitals:   04/16/22 1232  TempSrc: Oral  PainSc: 5                  Alexiz Sustaita C Kateri Balch

## 2022-04-16 NOTE — Op Note (Signed)
Preoperative diagnosis:  1.   Menorrhagia                                         2.  Dysmenorrhea   Postoperative diagnoses: Same as above   Procedure: Hysteroscopy, uterine curettage, endometrial ablation using Minerva  Surgeon: Lazaro Arms   Anesthesia: Laryngeal mask airway  Findings: The endometrium was normal. There were no fibroid or other abnormalities.  Description of operation: The patient was taken to the operating room and placed in the supine position. She underwent general anesthesia using the laryngeal mask airway. She was placed in the dorsal lithotomy position and prepped and draped in the usual sterile fashion. A Graves speculum was placed and the anterior cervical lip was grasped with a single-tooth tenaculum. The cervix was dilated serially to allow passage of the hysteroscope. Diagnostic hysteroscopy was performed and was found to be normal. A vigorous uterine curettage was then performed and all tissue sent to pathology for evaluation.  I then proceeded to perform the Minerva endometrial ablation.   The uterus sounded to 8.5 cm The handpiece was attached to the Minerva power source/machine and the handpiece passed the checklist. The array was squeezed down to remove all of the air present.  The array was then place into the endometrial cavity and deployed to a length of 5.5 cm. The handpiece confirmed appropriate width by being in the green portion of the visual dial. The cervical cuff was then inflated to the point the CO2 indicator was in the green. The endometrial integrity check was then performed and integrity sequence was confirmed x 2. The heating was then begun and carried out for a total of 2 minutes(which is standard therapy time). When the plasma cycle was finished,  the cervical cuff was deflated and the array was removed with tissue present on the silicon membrane. There was appropriate post Minerva bleeding and uterine discharge.     All of the  equipment worked well throughout the procedure.  The patient was awakened from anesthesia and taken to the recovery room in good stable condition all counts were correct. She received 2 g of Ancef and 30 mg of Toradol preoperatively. She will be discharged from the recovery room and followed up in the office in 1- 2 weeks.   She can expect 4 weeks of post procedure bloody watery discharge  Lazaro Arms, MD  04/16/2022 11:43 AM

## 2022-04-16 NOTE — H&P (Signed)
Preoperative History and Physical  [name] is a 41 y.o. G2P2 with No LMP recorded. (Menstrual status: Oral contraceptives). admitted for a hysteroscopy uterine curettage Minerva endometrial ablation.  Heavy menses since age 89 requiring heavy pads lots of clots and very painful Will manage with ablation + cyclical megestrol, will discuss IUD in future depending on the response  PMH:    Past Medical History:  Diagnosis Date   Anxiety    Arthritis    Depression    Hypercholesteremia    Sinus infection 03/27/2021    PSH:     Past Surgical History:  Procedure Laterality Date   BREAST BIOPSY Left 04/03/2021   Procedure: BREAST BIOPSY; excision of duct lesion;  Surgeon: Lucretia Roers, MD;  Location: [facility];  Service: General;  Laterality: Left;   CHOLECYSTECTOMY N/A 07/13/2014   Procedure: LAPAROSCOPIC CHOLECYSTECTOMY;  Surgeon: Marlane Hatcher, MD;  Location: [facility];  Service: General;  Laterality: N/A;   DENTAL SURGERY  2018   plate top & bottom   TUBAL LIGATION      POb/GynH:      OB History     Gravida  2   Para  2   Term      Preterm      AB      Living  2      SAB      IAB      Ectopic      Multiple      Live Births              SH:   Social History   Tobacco Use   Smoking status: Former    Packs/day: 0.50    Years: 15.00    Total pack years: 7.50    Types: Cigarettes    Quit date: 11/14/2019    Years since quitting: 2.4   Smokeless tobacco: Never  Vaping Use   Vaping Use: Never used  Substance Use Topics   Alcohol use: No   Drug use: No    FH:    Family History  Problem Relation Age of Onset   Heart disease Mother    Diabetes Mother    Heart disease Father    Hypertension Father    Thyroid disease Father    Hyperlipidemia Father    Breast cancer Paternal Aunt    Cancer - Colon Paternal Uncle    Breast cancer Paternal Grandmother      Allergies: No Known Allergies  Medications:       Current  Facility-Administered Medications:    ceFAZolin (ANCEF) 2-4 GM/100ML-% IVPB, , , ,    ceFAZolin (ANCEF) IVPB 2g/100 mL premix, 2 g, Intravenous, On Call to OR, Lazaro Arms, MD   ketorolac (TORADOL) 30 MG/ML injection, , , ,    povidone-iodine 10 % swab 2 Application, 2 Application, Topical, Once, Trino Higinbotham, Amaryllis Dyke, MD  Facility-Administered Medications Ordered in Other Encounters:    ketorolac (TORADOL) 30 MG/ML injection 30 mg, 30 mg, Intravenous, Once, Lazaro Arms, MD  Review of Systems:   Review of Systems  Constitutional: Negative for fever, chills, weight loss, malaise/fatigue and diaphoresis.  HENT: Negative for hearing loss, ear pain, nosebleeds, congestion, sore throat, neck pain, tinnitus and ear discharge.   Eyes: Negative for blurred vision, double vision, photophobia, pain, discharge and redness.  Respiratory: Negative for cough, hemoptysis, sputum production, shortness of breath, wheezing and stridor.   Cardiovascular: Negative for chest pain, palpitations, orthopnea, claudication, leg  swelling and PND.  Gastrointestinal: Positive for abdominal pain. Negative for heartburn, nausea, vomiting, diarrhea, constipation, blood in stool and melena.  Genitourinary: Negative for dysuria, urgency, frequency, hematuria and flank pain.  Musculoskeletal: Negative for myalgias, back pain, joint pain and falls.  Skin: Negative for itching and rash.  Neurological: Negative for dizziness, tingling, tremors, sensory change, speech change, focal weakness, seizures, loss of consciousness, weakness and headaches.  Endo/Heme/Allergies: Negative for environmental allergies and polydipsia. Does not bruise/bleed easily.  Psychiatric/Behavioral: Negative for depression, suicidal ideas, hallucinations, memory loss and substance abuse. The patient is not nervous/anxious and does not have insomnia.      PHYSICAL EXAM:  Blood pressure 119/85, pulse 75, temperature 98 F (36.7 C), temperature source  Oral, resp. rate 20, height 5\' 6"  (1.676 m), weight 120.2 kg, SpO2 98 %.    Vitals reviewed. Constitutional: She is oriented to person, place, and time. She appears well-developed and well-nourished.  HENT:  Head: Normocephalic and atraumatic.  Right Ear: External ear normal.  Left Ear: External ear normal.  Nose: Nose normal.  Mouth/Throat: Oropharynx is clear and moist.  Eyes: Conjunctivae and EOM are normal. Pupils are equal, round, and reactive to light. Right eye exhibits no discharge. Left eye exhibits no discharge. No scleral icterus.  Neck: Normal range of motion. Neck supple. No tracheal deviation present. No thyromegaly present.  Cardiovascular: Normal rate, regular rhythm, normal heart sounds and intact distal pulses.  Exam reveals no gallop and no friction rub.   No murmur heard. Respiratory: Effort normal and breath sounds normal. No respiratory distress. She has no wheezes. She has no rales. She exhibits no tenderness.  GI: Soft. Bowel sounds are normal. She exhibits no distension and no mass. There is tenderness. There is no rebound and no guarding.  Genitourinary:       Vulva is normal without lesions Vagina is pink moist without discharge Cervix normal in appearance and pap is normal Uterus is normal size, contour, position, consistency, mobility, non-tender Adnexa is negative with normal sized ovaries by sonogram  Musculoskeletal: Normal range of motion. She exhibits no edema and no tenderness.  Neurological: She is alert and oriented to person, place, and time. She has normal reflexes. She displays normal reflexes. No cranial nerve deficit. She exhibits normal muscle tone. Coordination normal.  Skin: Skin is warm and dry. No rash noted. No erythema. No pallor.  Psychiatric: She has a normal mood and affect. Her behavior is normal. Judgment and thought content normal.    Labs: Results for orders placed or performed during the hospital encounter of 04/14/22 (from the  past 336 hour(s))  CBC   Collection Time: 04/14/22  9:51 AM  Result Value Ref Range   WBC 5.3 4.0 - 10.5 K/uL   RBC 4.13 3.87 - 5.11 MIL/uL   Hemoglobin 12.0 12.0 - 15.0 g/dL   HCT 06/15/22 93.2 - 35.5 %   MCV 87.7 80.0 - 100.0 fL   MCH 29.1 26.0 - 34.0 pg   MCHC 33.1 30.0 - 36.0 g/dL   RDW 73.2 20.2 - 54.2 %   Platelets 288 150 - 400 K/uL   nRBC 0.0 0.0 - 0.2 %  Comprehensive metabolic panel   Collection Time: 04/14/22  9:51 AM  Result Value Ref Range   Sodium 137 135 - 145 mmol/L   Potassium 3.7 3.5 - 5.1 mmol/L   Chloride 110 98 - 111 mmol/L   CO2 20 (L) 22 - 32 mmol/L   Glucose, Bld 121 (H)  70 - 99 mg/dL   BUN 16 6 - 20 mg/dL   Creatinine, Ser 3.26 (H) 0.44 - 1.00 mg/dL   Calcium 9.4 8.9 - 71.2 mg/dL   Total Protein 7.6 6.5 - 8.1 g/dL   Albumin 4.3 3.5 - 5.0 g/dL   AST 38 15 - 41 U/L   ALT 48 (H) 0 - 44 U/L   Alkaline Phosphatase 46 38 - 126 U/L   Total Bilirubin 0.7 0.3 - 1.2 mg/dL   GFR, Estimated >45 >80 mL/min   Anion gap 7 5 - 15  Rapid HIV screen (HIV 1/2 Ab+Ag)   Collection Time: 04/14/22  9:54 AM  Result Value Ref Range   HIV-1 P24 Antigen - HIV24 NON REACTIVE NON REACTIVE   HIV 1/2 Antibodies NON REACTIVE NON REACTIVE   Interpretation (HIV Ag Ab)      A non reactive test result means that HIV 1 or HIV 2 antibodies and HIV 1 p24 antigen were not detected in the specimen.  Urinalysis, Routine w reflex microscopic Urine, Clean Catch   Collection Time: 04/14/22  9:54 AM  Result Value Ref Range   Color, Urine AMBER (A) YELLOW   APPearance CLOUDY (A) CLEAR   Specific Gravity, Urine 1.023 1.005 - 1.030   pH 5.0 5.0 - 8.0   Glucose, UA NEGATIVE NEGATIVE mg/dL   Hgb urine dipstick NEGATIVE NEGATIVE   Bilirubin Urine NEGATIVE NEGATIVE   Ketones, ur NEGATIVE NEGATIVE mg/dL   Protein, ur 30 (A) NEGATIVE mg/dL   Nitrite NEGATIVE NEGATIVE   Leukocytes,Ua MODERATE (A) NEGATIVE  Pregnancy, urine   Collection Time: 04/14/22  9:54 AM  Result Value Ref Range   Preg  Test, Ur NEGATIVE NEGATIVE    EKG: Orders placed or performed in visit on 12/31/20   EKG 12-Lead    Imaging Studies: No results found.    Assessment: 1. Menorrhagia with regular cycle  N92.0      megace works well, will cycle 30 days on/4 days off to manage cycle but not continuous, plan to do endometrial ablation as well, 04/16/22     2. Dysmenorrhea  N94.6      2 day premenstrual cramps then tapers with her bleeding, toradol Rx       Plan: Hysteroscopy uterine curettage Minerva endometrial ablation  Lazaro Arms 04/16/2022 10:30 AM

## 2022-04-16 NOTE — Anesthesia Preprocedure Evaluation (Signed)
Anesthesia Evaluation  Patient identified by MRN, date of birth, ID band Patient awake    Reviewed: Allergy & Precautions, NPO status , Patient's Chart, lab work & pertinent test results  Airway Mallampati: I  TM Distance: >3 FB Neck ROM: Full    Dental  (+) Edentulous Upper, Edentulous Lower   Pulmonary former smoker,    Pulmonary exam normal breath sounds clear to auscultation       Cardiovascular negative cardio ROS Normal cardiovascular exam Rhythm:Regular Rate:Normal     Neuro/Psych PSYCHIATRIC DISORDERS Anxiety Depression negative neurological ROS     GI/Hepatic Neg liver ROS, GERD  Medicated and Controlled,  Endo/Other  Morbid obesity  Renal/GU negative Renal ROS  negative genitourinary   Musculoskeletal  (+) Arthritis , Osteoarthritis,    Abdominal   Peds negative pediatric ROS (+)  Hematology negative hematology ROS (+)   Anesthesia Other Findings Right hip and buttock pain  Reproductive/Obstetrics negative OB ROS                            Anesthesia Physical Anesthesia Plan  ASA: 2  Anesthesia Plan: General   Post-op Pain Management: Dilaudid IV   Induction: Intravenous  PONV Risk Score and Plan: 4 or greater and Ondansetron, Dexamethasone and Midazolam  Airway Management Planned: Oral ETT  Additional Equipment:   Intra-op Plan:   Post-operative Plan: Extubation in OR  Informed Consent: I have reviewed the patients History and Physical, chart, labs and discussed the procedure including the risks, benefits and alternatives for the proposed anesthesia with the patient or authorized representative who has indicated his/her understanding and acceptance.     Dental advisory given  Plan Discussed with: CRNA and Surgeon  Anesthesia Plan Comments:         Anesthesia Quick Evaluation

## 2022-04-17 ENCOUNTER — Encounter (HOSPITAL_COMMUNITY): Payer: Self-pay | Admitting: Obstetrics & Gynecology

## 2022-04-25 ENCOUNTER — Encounter: Payer: Self-pay | Admitting: Obstetrics & Gynecology

## 2022-04-25 ENCOUNTER — Ambulatory Visit (INDEPENDENT_AMBULATORY_CARE_PROVIDER_SITE_OTHER): Payer: 59 | Admitting: Obstetrics & Gynecology

## 2022-04-25 VITALS — BP 145/88 | HR 70 | Ht 66.0 in | Wt 270.0 lb

## 2022-04-25 DIAGNOSIS — Z9889 Other specified postprocedural states: Secondary | ICD-10-CM | POA: Diagnosis not present

## 2022-04-25 DIAGNOSIS — N920 Excessive and frequent menstruation with regular cycle: Secondary | ICD-10-CM

## 2022-04-25 NOTE — Progress Notes (Signed)
  HPI: Patient returns for routine postoperative follow-up having undergone Hysteroscopy uterine curettage on 04/16/22.  The patient's immediate postoperative recovery has been unremarkable. Since hospital discharge the patient reports no roblems, blood tinged watery discharge.   Current Outpatient Medications: citalopram (CELEXA) 40 MG tablet, Take 1 tablet by mouth once daily, Disp: 30 tablet, Rfl: 0 fenofibrate (TRICOR) 145 MG tablet, Take 1 tablet (145 mg total) by mouth daily., Disp: 30 tablet, Rfl: 6 hydrOXYzine (VISTARIL) 25 MG capsule, Take 1 capsule (25 mg total) by mouth every 8 (eight) hours as needed., Disp: 30 capsule, Rfl: 0 megestrol (MEGACE) 40 MG tablet, Take 1 tablet (40 mg total) by mouth daily., Disp: 30 tablet, Rfl: 11 meloxicam (MOBIC) 15 MG tablet, TAKE 1 TABLET BY MOUTH ONCE DAILY AS NEEDED FOR PAIN, Disp: 30 tablet, Rfl: 1 norethindrone (JENCYCLA) 0.35 MG tablet, Take 1 tablet (0.35 mg total) by mouth daily., Disp: 84 tablet, Rfl: 3 pantoprazole (PROTONIX) 40 MG tablet, Take 1 tablet (40 mg total) by mouth daily., Disp: 30 tablet, Rfl: 6 simvastatin (ZOCOR) 40 MG tablet, Take 1 tablet (40 mg total) by mouth at bedtime., Disp: 90 tablet, Rfl: 1 Vitamin D, Ergocalciferol, (DRISDOL) 1.25 MG (50000 UNIT) CAPS capsule, Take 1 capsule by mouth once a week, Disp: 8 capsule, Rfl: 0 ondansetron (ZOFRAN-ODT) 8 MG disintegrating tablet, Take 1 tablet (8 mg total) by mouth every 8 (eight) hours as needed for nausea or vomiting. (Patient not taking: Reported on 04/25/2022), Disp: 8 tablet, Rfl: 0  No current facility-administered medications for this visit.    Blood pressure (!) 145/88, pulse 70, height 5\' 6"  (1.676 m), weight 270 lb (122.5 kg).  Physical Exam: Normal post ablation exam  Diagnostic Tests:   Pathology: N/a  Impression + Management plan:   ICD-10-CM   1. Post-operative state: S/P endometrial ablation  Z98.890          Medications Prescribed this  encounter: No orders of the defined types were placed in this encounter.     Follow up: prn   , MD Attending Physician for the [facility] 04/25/2022 9:36 AM

## 2022-05-07 ENCOUNTER — Other Ambulatory Visit: Payer: Self-pay | Admitting: Physician Assistant

## 2022-05-08 ENCOUNTER — Other Ambulatory Visit: Payer: Self-pay | Admitting: Nurse Practitioner

## 2022-05-08 DIAGNOSIS — F419 Anxiety disorder, unspecified: Secondary | ICD-10-CM

## 2022-05-15 ENCOUNTER — Other Ambulatory Visit: Payer: Self-pay | Admitting: Advanced Practice Midwife

## 2022-05-15 ENCOUNTER — Other Ambulatory Visit: Payer: Self-pay | Admitting: Physician Assistant

## 2022-05-29 ENCOUNTER — Other Ambulatory Visit: Payer: Self-pay | Admitting: Physician Assistant

## 2022-05-30 ENCOUNTER — Other Ambulatory Visit: Payer: Self-pay

## 2022-05-30 ENCOUNTER — Other Ambulatory Visit: Payer: Self-pay | Admitting: Physician Assistant

## 2022-05-30 ENCOUNTER — Other Ambulatory Visit: Payer: Self-pay | Admitting: Nurse Practitioner

## 2022-05-30 MED ORDER — FENOFIBRATE 145 MG PO TABS: 145.0000 mg | ORAL_TABLET | Freq: Every day | ORAL | 6 refills | Status: DC

## 2022-06-14 ENCOUNTER — Other Ambulatory Visit: Payer: Self-pay | Admitting: Nurse Practitioner

## 2022-06-14 DIAGNOSIS — F419 Anxiety disorder, unspecified: Secondary | ICD-10-CM

## 2022-06-17 ENCOUNTER — Other Ambulatory Visit: Payer: Self-pay | Admitting: Nurse Practitioner

## 2022-06-17 DIAGNOSIS — E559 Vitamin D deficiency, unspecified: Secondary | ICD-10-CM

## 2022-07-10 ENCOUNTER — Other Ambulatory Visit: Payer: Self-pay | Admitting: Nurse Practitioner

## 2022-07-10 DIAGNOSIS — F419 Anxiety disorder, unspecified: Secondary | ICD-10-CM

## 2022-07-18 ENCOUNTER — Other Ambulatory Visit: Payer: Self-pay

## 2022-07-18 DIAGNOSIS — E785 Hyperlipidemia, unspecified: Secondary | ICD-10-CM | POA: Diagnosis not present

## 2022-07-18 DIAGNOSIS — R7303 Prediabetes: Secondary | ICD-10-CM

## 2022-07-19 LAB — CMP14+EGFR
ALT: 41 IU/L — ABNORMAL HIGH (ref 0–32)
AST: 33 IU/L (ref 0–40)
Albumin/Globulin Ratio: 1.9 (ref 1.2–2.2)
Albumin: 4.7 g/dL (ref 3.9–4.9)
Alkaline Phosphatase: 48 IU/L (ref 44–121)
BUN/Creatinine Ratio: 13 (ref 9–23)
BUN: 13 mg/dL (ref 6–24)
Bilirubin Total: <0.2 mg/dL (ref 0.0–1.2)
CO2: 16 mmol/L — ABNORMAL LOW (ref 20–29)
Calcium: 9.8 mg/dL (ref 8.7–10.2)
Chloride: 109 mmol/L — ABNORMAL HIGH (ref 96–106)
Creatinine, Ser: 1.03 mg/dL — ABNORMAL HIGH (ref 0.57–1.00)
Globulin, Total: 2.5 g/dL (ref 1.5–4.5)
Glucose: 101 mg/dL — ABNORMAL HIGH (ref 70–99)
Potassium: 4.3 mmol/L (ref 3.5–5.2)
Sodium: 141 mmol/L (ref 134–144)
Total Protein: 7.2 g/dL (ref 6.0–8.5)
eGFR: 70 mL/min/{1.73_m2} (ref >59)

## 2022-07-19 LAB — LIPID PANEL
Chol/HDL Ratio: 4.5 ratio — ABNORMAL HIGH (ref 0.0–4.4)
Cholesterol, Total: 130 mg/dL (ref 100–199)
HDL: 29 mg/dL — ABNORMAL LOW (ref >39)
LDL Chol Calc (NIH): 77 mg/dL (ref 0–99)
Triglycerides: 134 mg/dL (ref 0–149)
VLDL Cholesterol Cal: 24 mg/dL (ref 5–40)

## 2022-07-19 LAB — HEMOGLOBIN A1C
Est. average glucose Bld gHb Est-mCnc: 140 mg/dL
Hgb A1c MFr Bld: 6.5 % — ABNORMAL HIGH (ref 4.8–5.6)

## 2022-07-21 ENCOUNTER — Other Ambulatory Visit: Payer: Self-pay | Admitting: Obstetrics & Gynecology

## 2022-07-21 ENCOUNTER — Other Ambulatory Visit: Payer: Self-pay | Admitting: Nurse Practitioner

## 2022-07-21 NOTE — Progress Notes (Signed)
Liver enzymes much improved, avoid alcohol, tylenol.  Kidney function is stable  Lipid panel is stable Continue current medications

## 2022-07-21 NOTE — Progress Notes (Signed)
A1C 6.5, This is classified as type two diabetes  avoid sugar sweets , soda, juices to keep this condition under controlled, follow up as planned

## 2022-07-22 ENCOUNTER — Ambulatory Visit: Payer: 59 | Admitting: Internal Medicine

## 2022-07-22 ENCOUNTER — Encounter: Payer: Self-pay | Admitting: Internal Medicine

## 2022-07-22 VITALS — BP 124/77 | HR 74 | Ht 64.0 in | Wt 264.4 lb

## 2022-07-22 DIAGNOSIS — Z23 Encounter for immunization: Secondary | ICD-10-CM | POA: Insufficient documentation

## 2022-07-22 DIAGNOSIS — R69 Illness, unspecified: Secondary | ICD-10-CM | POA: Diagnosis not present

## 2022-07-22 DIAGNOSIS — E119 Type 2 diabetes mellitus without complications: Secondary | ICD-10-CM | POA: Diagnosis not present

## 2022-07-22 DIAGNOSIS — E785 Hyperlipidemia, unspecified: Secondary | ICD-10-CM | POA: Diagnosis not present

## 2022-07-22 DIAGNOSIS — F419 Anxiety disorder, unspecified: Secondary | ICD-10-CM | POA: Diagnosis not present

## 2022-07-22 MED ORDER — METFORMIN HCL ER 500 MG PO TB24: 500.0000 mg | ORAL_TABLET | Freq: Every day | ORAL | 2 refills | Status: DC

## 2022-07-22 MED ORDER — SEMAGLUTIDE(0.25 OR 0.5MG/DOS) 2 MG/3ML ~~LOC~~ SOPN: 0.2500 mg | PEN_INJECTOR | SUBCUTANEOUS | 0 refills | Status: DC

## 2022-07-22 NOTE — Patient Instructions (Signed)
It was a pleasure to see you today.  Thank you for giving [place] the opportunity to be involved in your care.  Below is a brief recap of your visit and next steps.  We will plan to see you again in 4 weeks  Summary I have prescribed metformin and Ozempic for diabetes and weight We will check your urine for protein You will receive your tetanus shot Your cholesterol is stable, no medication changes  Next steps Follow up in 4 weeks

## 2022-07-22 NOTE — Assessment & Plan Note (Signed)
Well-controlled on Celexa.  She is not taking previously prescribed hydroxyzine because it made her sleepy. -No changes today

## 2022-07-22 NOTE — Assessment & Plan Note (Signed)
Lipid panel updated last week.  Cholesterol well controlled.  She is currently prescribed simvastatin 40 mg daily and fenofibrate 145 mg daily.  No changes today.

## 2022-07-22 NOTE — Progress Notes (Signed)
Established Patient Office Visit  Subjective   Patient ID: [name], female    DOB: 1981/03/03  Age: 41 y.o. MRN: [id number]  Chief Complaint  Patient presents with   Follow-up    HLD/Anxiety   [name] returns to care today.  She is a 41 year old woman with a past medical history significant for HLD, anxiety, GERD, and recently diagnosed T2DM.  She was last seen at [facility] by Vena Rua, NP for routine follow-up on 02/18/2022.  24-monthfollow-up planned at that time to discuss HLD and anxiety.  In the interim, her hemoglobin A1c was 6.5, meeting criteria for diagnosis of diabetes mellitus.  Today [name] states that she feels well.  She has no acute concerns currently.  She feels that her anxiety is well controlled on Celexa.  She has been taking simvastatin and fenofibrate as prescribed for hyperlipidemia.  Past Medical History:  Diagnosis Date   Anxiety    Arthritis    Depression    Hypercholesteremia    Sinus infection 03/27/2021   Past Surgical History:  Procedure Laterality Date   BREAST BIOPSY Left 04/03/2021   Procedure: BREAST BIOPSY; excision of duct lesion;  Surgeon: BVirl Cagey MD;  Location: [facility];  Service: General;  Laterality: Left;   CHOLECYSTECTOMY N/A 07/13/2014   Procedure: LAPAROSCOPIC CHOLECYSTECTOMY;  Surgeon: WScherry Ran MD;  Location: [facility];  Service: General;  Laterality: N/A;   DENTAL SURGERY  2018   plate top & bottom   DILATATION AND CURETTAGE/HYSTEROSCOPY WITH MINERVA N/A 04/16/2022   Procedure: DILATATION AND CURETTAGE/HYSTEROSCOPY WITH MINERVA;  Surgeon: EFlorian Buff MD;  Location: [facility];  Service: Gynecology;  Laterality: N/A;   TUBAL LIGATION     Social History   Tobacco Use   Smoking status: Former    Packs/day: 0.50    Years: 15.00    Total pack years: 7.50    Types: Cigarettes    Quit date: 11/14/2019    Years since quitting: 2.6   Smokeless tobacco: Never  Vaping Use   Vaping Use: Never used  Substance Use  Topics   Alcohol use: No   Drug use: No   Family History  Problem Relation Age of Onset   Heart disease Mother    Diabetes Mother    Heart disease Father    Hypertension Father    Thyroid disease Father    Hyperlipidemia Father    Breast cancer Paternal Aunt    Cancer - Colon Paternal Uncle    Breast cancer Paternal Grandmother    No Known Allergies  Review of Systems  Constitutional:  Negative for chills and fever.  HENT:  Negative for sore throat.   Respiratory:  Negative for cough and shortness of breath.   Cardiovascular:  Negative for chest pain, palpitations and leg swelling.  Gastrointestinal:  Negative for abdominal pain, blood in stool, constipation, diarrhea, nausea and vomiting.  Genitourinary:  Negative for dysuria and hematuria.  Musculoskeletal:  Negative for myalgias.  Skin:  Negative for itching and rash.  Neurological:  Negative for dizziness and headaches.  Psychiatric/Behavioral:  Negative for depression and suicidal ideas.      Objective:     BP 124/77   Pulse 74   Ht _0  (1.626 m)   Wt 264 lb 6.4 oz (119.9 kg)   SpO2 98%   BMI 45.38 kg/m  BP Readings from Last 3 Encounters:  07/22/22 124/77  04/25/22 (!) 145/88  04/16/22 112/70   Physical  Exam Vitals reviewed.  Constitutional:      General: She is not in acute distress.    Appearance: Normal appearance. She is obese. She is not toxic-appearing.  HENT:     Head: Normocephalic and atraumatic.     Right Ear: External ear normal.     Left Ear: External ear normal.     Nose: Nose normal. No congestion or rhinorrhea.     Mouth/Throat:     Mouth: Mucous membranes are moist.     Pharynx: Oropharynx is clear. No oropharyngeal exudate or posterior oropharyngeal erythema.  Eyes:     General: No scleral icterus.    Extraocular Movements: Extraocular movements intact.     Conjunctiva/sclera: Conjunctivae normal.     Pupils: Pupils are equal, round, and reactive to light.  Cardiovascular:      Rate and Rhythm: Normal rate and regular rhythm.     Pulses: Normal pulses.     Heart sounds: Normal heart sounds. No murmur heard.    No friction rub. No gallop.  Pulmonary:     Effort: Pulmonary effort is normal.     Breath sounds: Normal breath sounds. No wheezing, rhonchi or rales.  Abdominal:     General: Abdomen is flat. Bowel sounds are normal. There is no distension.     Palpations: Abdomen is soft.     Tenderness: There is no abdominal tenderness.  Musculoskeletal:     Cervical back: Normal range of motion.     Right lower leg: No edema.     Left lower leg: No edema.  Lymphadenopathy:     Cervical: No cervical adenopathy.  Skin:    General: Skin is warm and dry.     Capillary Refill: Capillary refill takes less than 2 seconds.     Coloration: Skin is not jaundiced.  Neurological:     General: No focal deficit present.     Mental Status: She is alert and oriented to person, place, and time.  Psychiatric:        Mood and Affect: Mood normal.        Behavior: Behavior normal.    Last CBC Lab Results  Component Value Date   WBC 5.3 04/14/2022   HGB 12.0 04/14/2022   HCT 36.2 04/14/2022   MCV 87.7 04/14/2022   MCH 29.1 04/14/2022   RDW 13.9 04/14/2022   PLT 288 97/41/6384   Last metabolic panel Lab Results  Component Value Date   GLUCOSE 101 (H) 07/18/2022   NA 141 07/18/2022   K 4.3 07/18/2022   CL 109 (H) 07/18/2022   CO2 16 (L) 07/18/2022   BUN 13 07/18/2022   CREATININE 1.03 (H) 07/18/2022   EGFR 70 07/18/2022   CALCIUM 9.8 07/18/2022   PROT 7.2 07/18/2022   ALBUMIN 4.7 07/18/2022   LABGLOB 2.5 07/18/2022   AGRATIO 1.9 07/18/2022   BILITOT <0.2 07/18/2022   ALKPHOS 48 07/18/2022   AST 33 07/18/2022   ALT 41 (H) 07/18/2022   ANIONGAP 7 04/14/2022   Last lipids Lab Results  Component Value Date   CHOL 130 07/18/2022   HDL 29 (L) 07/18/2022   LDLCALC 77 07/18/2022   TRIG 134 07/18/2022   CHOLHDL 4.5 (H) 07/18/2022   Last hemoglobin A1c Lab  Results  Component Value Date   HGBA1C 6.5 (H) 07/18/2022   Last thyroid functions Lab Results  Component Value Date   TSH 1.230 02/10/2022   Last vitamin D Lab Results  Component Value Date   VD25OH  10.0 (L) 02/10/2022   The 10-year ASCVD risk score (Arnett DK, et al., 2019) is: 1.7%    Assessment & Plan:   Problem List Items Addressed This Visit     Type 2 diabetes mellitus without complications ([place]) - Primary    HbA1c 6.5 last week, meeting criteria for diagnosis of diabetes mellitus. -Extended release metformin 5 mg daily started today -I have also prescribed Ozempic 0.25 mg weekly injections -She will follow-up in 4 weeks for reassessment -Urine microalbumin/creatinine ratio ordered -Referral placed to ophthalmology for routine diabetic eye care      Hyperlipidemia    Lipid panel updated last week.  Cholesterol well controlled.  She is currently prescribed simvastatin 40 mg daily and fenofibrate 145 mg daily.  No changes today.      Anxiety    Well-controlled on Celexa.  She is not taking previously prescribed hydroxyzine because it made her sleepy. -No changes today      Need for Tdap vaccination    Tdap vaccine administered today      Return in about 4 weeks (around 08/19/2022).    Johnette Abraham, MD

## 2022-07-22 NOTE — Assessment & Plan Note (Signed)
HbA1c 6.5 last week, meeting criteria for diagnosis of diabetes mellitus. -Extended release metformin 5 mg daily started today -I have also prescribed Ozempic 0.25 mg weekly injections -She will follow-up in 4 weeks for reassessment -Urine microalbumin/creatinine ratio ordered -Referral placed to ophthalmology for routine diabetic eye care

## 2022-07-22 NOTE — Assessment & Plan Note (Signed)
-   Tdap vaccine administered today.

## 2022-07-24 LAB — MICROALBUMIN / CREATININE URINE RATIO
Creatinine, Urine: 195.8 mg/dL
Microalb/Creat Ratio: 6 mg/g creat (ref 0–29)
Microalbumin, Urine: 12.4 ug/mL

## 2022-07-29 ENCOUNTER — Other Ambulatory Visit: Payer: Self-pay | Admitting: Nurse Practitioner

## 2022-07-29 ENCOUNTER — Telehealth: Payer: Self-pay

## 2022-07-29 DIAGNOSIS — E119 Type 2 diabetes mellitus without complications: Secondary | ICD-10-CM

## 2022-07-29 NOTE — Telephone Encounter (Signed)
Patient insurance denied the shot, needs something else called in  Semaglutide,0.25 or 0.5MG /DOS, 2 MG/3ML [facility] [place] [address]  [address] , [place] [address]  Phone:  [phone]  Fax:  [phone]

## 2022-07-30 ENCOUNTER — Other Ambulatory Visit: Payer: Self-pay

## 2022-07-30 MED ORDER — MEGESTROL ACETATE 40 MG PO TABS: 40.0000 mg | ORAL_TABLET | Freq: Every day | ORAL | 11 refills | Status: DC

## 2022-08-04 ENCOUNTER — Other Ambulatory Visit: Payer: Self-pay

## 2022-08-04 ENCOUNTER — Telehealth: Payer: Self-pay

## 2022-08-04 NOTE — Telephone Encounter (Signed)
Patient calling back.   °

## 2022-08-04 NOTE — Telephone Encounter (Signed)
error 

## 2022-08-05 ENCOUNTER — Ambulatory Visit: Payer: 59

## 2022-08-05 LAB — HM DIABETES EYE EXAM

## 2022-08-05 MED ORDER — TRULICITY 0.75 MG/0.5ML ~~LOC~~ SOAJ: 0.7500 mg | SUBCUTANEOUS | 0 refills | Status: AC

## 2022-08-05 NOTE — Addendum Note (Signed)
Addended by: Johnette Abraham on: 08/05/2022 08:59 AM   Modules accepted: Orders

## 2022-08-08 ENCOUNTER — Other Ambulatory Visit: Payer: Self-pay | Admitting: Nurse Practitioner

## 2022-08-08 DIAGNOSIS — F419 Anxiety disorder, unspecified: Secondary | ICD-10-CM

## 2022-08-19 ENCOUNTER — Encounter: Payer: Self-pay | Admitting: Internal Medicine

## 2022-08-19 ENCOUNTER — Ambulatory Visit: Payer: 59 | Admitting: Internal Medicine

## 2022-08-19 VITALS — BP 130/74 | HR 86 | Ht 65.0 in | Wt 263.2 lb

## 2022-08-19 DIAGNOSIS — J302 Other seasonal allergic rhinitis: Secondary | ICD-10-CM

## 2022-08-19 DIAGNOSIS — Z889 Allergy status to unspecified drugs, medicaments and biological substances status: Secondary | ICD-10-CM

## 2022-08-19 DIAGNOSIS — E119 Type 2 diabetes mellitus without complications: Secondary | ICD-10-CM

## 2022-08-19 NOTE — Progress Notes (Signed)
Established Patient Office Visit  Subjective   Patient ID: [name], female    DOB: 08/31/1981  Age: 41 y.o. MRN: [id number]  Chief Complaint  Patient presents with   Follow-up    Needs something for allergies    [name] returns to care today. She is a 41 year-old woman with a  past medical history significant for HLD, anxiety, GERD, and T2DM.  She was last seen by me on 10/10 in routine follow-up.  At that time Ozempic was prescribed for treatment of both diabetes and weight loss.  3-monthfollow-up was arranged.  There have been no acute interval events. Ms. BJeffordsstates that her insurance did not cover Ozempic but did cover Trulicity.  She has completed 2 injections, which have gone well.  Today she endorses upper respiratory symptoms that were present for the last 4 days.  She describes rhinorrhea, fatigue, chills, sore throat, and a headache.  She denies recent sick contacts.  She believes that her symptoms are related to allergies and she request treatment recommendations.  Past Medical History:  Diagnosis Date   Anxiety    Arthritis    Depression    Hypercholesteremia    Sinus infection 03/27/2021   Past Surgical History:  Procedure Laterality Date   BREAST BIOPSY Left 04/03/2021   Procedure: BREAST BIOPSY; excision of duct lesion;  Surgeon: BVirl Cagey MD;  Location: [facility];  Service: General;  Laterality: Left;   CHOLECYSTECTOMY N/A 07/13/2014   Procedure: LAPAROSCOPIC CHOLECYSTECTOMY;  Surgeon: WScherry Ran MD;  Location: [facility];  Service: General;  Laterality: N/A;   DENTAL SURGERY  2018   plate top & bottom   DILATATION AND CURETTAGE/HYSTEROSCOPY WITH MINERVA N/A 04/16/2022   Procedure: DILATATION AND CURETTAGE/HYSTEROSCOPY WITH MINERVA;  Surgeon: EFlorian Buff MD;  Location: [facility];  Service: Gynecology;  Laterality: N/A;   TUBAL LIGATION     Social History   Tobacco Use   Smoking status: Former    Packs/day: 0.50    Years: 15.00    Total pack  years: 7.50    Types: Cigarettes    Quit date: 11/14/2019    Years since quitting: 2.7   Smokeless tobacco: Never  Vaping Use   Vaping Use: Never used  Substance Use Topics   Alcohol use: No   Drug use: No   Family History  Problem Relation Age of Onset   Heart disease Mother    Diabetes Mother    Heart disease Father    Hypertension Father    Thyroid disease Father    Hyperlipidemia Father    Breast cancer Paternal Aunt    Cancer - Colon Paternal Uncle    Breast cancer Paternal Grandmother    No Known Allergies  Review of Systems  Constitutional:  Negative for chills, fever and malaise/fatigue.  HENT:  Positive for congestion and sinus pain. Negative for sore throat.   Respiratory:  Positive for cough. Negative for sputum production, shortness of breath and wheezing.   All other systems reviewed and are negative.    Objective:     BP 130/74   Pulse 86   Ht _0  (1.651 m)   Wt 263 lb 3.2 oz (119.4 kg)   SpO2 96%   BMI 43.80 kg/m  BP Readings from Last 3 Encounters:  08/19/22 130/74  07/22/22 124/77  04/25/22 (!) 145/88   Physical Exam Constitutional:      General: She is not in acute distress.  Appearance: Normal appearance. She is obese. She is not toxic-appearing.  HENT:     Head: Normocephalic and atraumatic.     Right Ear: External ear normal.     Left Ear: External ear normal.     Nose: Congestion and rhinorrhea present.     Mouth/Throat:     Mouth: Mucous membranes are moist.     Pharynx: Oropharynx is clear. No oropharyngeal exudate or posterior oropharyngeal erythema.  Eyes:     General: No scleral icterus.    Extraocular Movements: Extraocular movements intact.     Conjunctiva/sclera: Conjunctivae normal.     Pupils: Pupils are equal, round, and reactive to light.  Cardiovascular:     Rate and Rhythm: Normal rate and regular rhythm.     Pulses: Normal pulses.     Heart sounds: Normal heart sounds. No murmur heard.    No friction rub. No  gallop.  Pulmonary:     Effort: Pulmonary effort is normal.     Breath sounds: Normal breath sounds. No wheezing, rhonchi or rales.  Abdominal:     General: Abdomen is flat. Bowel sounds are normal. There is no distension.     Palpations: Abdomen is soft.     Tenderness: There is no abdominal tenderness.  Musculoskeletal:        General: No swelling. Normal range of motion.     Cervical back: Normal range of motion.     Right lower leg: No edema.     Left lower leg: No edema.  Lymphadenopathy:     Cervical: No cervical adenopathy.  Skin:    General: Skin is warm and dry.     Capillary Refill: Capillary refill takes less than 2 seconds.     Coloration: Skin is not jaundiced.  Neurological:     General: No focal deficit present.     Mental Status: She is alert and oriented to person, place, and time.  Psychiatric:        Mood and Affect: Mood normal.        Behavior: Behavior normal.    Diabetic foot exam was performed.  No deformities or other abnormal visual findings.  Posterior tibialis and dorsalis pulse intact bilaterally.  Intact to touch and monofilament testing bilaterally.    Last CBC Lab Results  Component Value Date   WBC 5.3 04/14/2022   HGB 12.0 04/14/2022   HCT 36.2 04/14/2022   MCV 87.7 04/14/2022   MCH 29.1 04/14/2022   RDW 13.9 04/14/2022   PLT 288 18/84/1660   Last metabolic panel Lab Results  Component Value Date   GLUCOSE 101 (H) 07/18/2022   NA 141 07/18/2022   K 4.3 07/18/2022   CL 109 (H) 07/18/2022   CO2 16 (L) 07/18/2022   BUN 13 07/18/2022   CREATININE 1.03 (H) 07/18/2022   EGFR 70 07/18/2022   CALCIUM 9.8 07/18/2022   PROT 7.2 07/18/2022   ALBUMIN 4.7 07/18/2022   LABGLOB 2.5 07/18/2022   AGRATIO 1.9 07/18/2022   BILITOT <0.2 07/18/2022   ALKPHOS 48 07/18/2022   AST 33 07/18/2022   ALT 41 (H) 07/18/2022   ANIONGAP 7 04/14/2022   Last lipids Lab Results  Component Value Date   CHOL 130 07/18/2022   HDL 29 (L) 07/18/2022    LDLCALC 77 07/18/2022   TRIG 134 07/18/2022   CHOLHDL 4.5 (H) 07/18/2022   Last hemoglobin A1c Lab Results  Component Value Date   HGBA1C 6.5 (H) 07/18/2022   Last thyroid functions Lab  Results  Component Value Date   TSH 1.230 02/10/2022   Last vitamin D Lab Results  Component Value Date   VD25OH 10.0 (L) 02/10/2022   The 10-year ASCVD risk score (Arnett DK, et al., 2019) is: 1.8%    Assessment & Plan:   Problem List Items Addressed This Visit       Type 2 diabetes mellitus without complications ([place]) - Primary    Recently diagnosed with T2DM.  She is currently prescribed metformin XR 500 mg daily and Trulicity 5.83 mg weekly injections.  She has tolerated injections well. -No medication changes today.  She will notify us when she has completed four 4.62 mg Trulicity injections.  At which time we can increase the dose.  Continue metformin at its current dose. -Diabetic foot exam completed today      H/O seasonal allergies    She endorses a 4-day history of headache, sore throat, rhinorrhea, fatigue, and chills.  She attributes her current symptoms to seasonal allergies and requests treatment recommendations.  I have recommended use of saline rinse followed by fluticasone nasal spray, and nasal decongestant, and a daily antihistamine such as cetirizine for symptoms.  She can also take Tylenol as needed for pain relief.  I have recommended that she return to care if she develops fever or worsening shortness of breath.      Return in about 2 months (around 10/19/2022).    Johnette Abraham, MD

## 2022-08-19 NOTE — Patient Instructions (Signed)
It was a pleasure to see you today.  Thank you for giving [place] the opportunity to be involved in your care.  Below is a brief recap of your visit and next steps.  We will plan to see you again in 2 months.  Summary No medication changes today I recommend saline spray, fluticasone, cetirizine, and a nasal decongestant to manage your allergy symptoms. You can take tylenol for pain relief Follow up in 2 months.

## 2022-08-21 ENCOUNTER — Ambulatory Visit: Payer: 59 | Admitting: Internal Medicine

## 2022-08-21 ENCOUNTER — Ambulatory Visit: Payer: 59 | Admitting: Nurse Practitioner

## 2022-08-25 ENCOUNTER — Encounter: Payer: Self-pay | Admitting: Internal Medicine

## 2022-08-25 DIAGNOSIS — Z889 Allergy status to unspecified drugs, medicaments and biological substances status: Secondary | ICD-10-CM | POA: Insufficient documentation

## 2022-08-25 NOTE — Assessment & Plan Note (Signed)
Recently diagnosed with T2DM.  She is currently prescribed metformin XR 500 mg daily and Trulicity 0.75 mg weekly injections.  She has tolerated injections well. -No medication changes today.  She will notify us when she has completed four 0.75 mg Trulicity injections.  At which time we can increase the dose.  Continue metformin at its current dose. -Diabetic foot exam completed today

## 2022-08-25 NOTE — Assessment & Plan Note (Signed)
She endorses a 4-day history of headache, sore throat, rhinorrhea, fatigue, and chills.  She attributes her current symptoms to seasonal allergies and requests treatment recommendations.  I have recommended use of saline rinse followed by fluticasone nasal spray, and nasal decongestant, and a daily antihistamine such as cetirizine for symptoms.  She can also take Tylenol as needed for pain relief.  I have recommended that she return to care if she develops fever or worsening shortness of breath.

## 2022-08-29 ENCOUNTER — Telehealth: Payer: Self-pay | Admitting: Internal Medicine

## 2022-08-29 DIAGNOSIS — E119 Type 2 diabetes mellitus without complications: Secondary | ICD-10-CM

## 2022-08-29 MED ORDER — TRULICITY 1.5 MG/0.5ML ~~LOC~~ SOAJ: 1.5000 mg | SUBCUTANEOUS | 1 refills | Status: AC

## 2022-08-29 NOTE — Telephone Encounter (Signed)
Patient called need med refill  Trulicity  0.7 mg needs to up this mg to higher dose.    Pharmacy  [facility] [address] [place] - [address] [place] [address], [place] [address] Phone: [phone]  Fax: [phone] DEA #: --  DAW Reason: --

## 2022-08-29 NOTE — Addendum Note (Signed)
Addended by: Christel Mormon E on: 08/29/2022 11:41 AM   Modules accepted: Orders

## 2022-08-31 ENCOUNTER — Other Ambulatory Visit: Payer: Self-pay | Admitting: Nurse Practitioner

## 2022-08-31 DIAGNOSIS — E559 Vitamin D deficiency, unspecified: Secondary | ICD-10-CM

## 2022-09-01 ENCOUNTER — Other Ambulatory Visit: Payer: Self-pay | Admitting: Internal Medicine

## 2022-09-01 ENCOUNTER — Other Ambulatory Visit: Payer: Self-pay | Admitting: Obstetrics & Gynecology

## 2022-09-01 DIAGNOSIS — F419 Anxiety disorder, unspecified: Secondary | ICD-10-CM

## 2022-09-08 ENCOUNTER — Other Ambulatory Visit: Payer: Self-pay

## 2022-09-09 MED ORDER — KETOROLAC TROMETHAMINE 10 MG PO TABS: 10.0000 mg | ORAL_TABLET | Freq: Three times a day (TID) | ORAL | 0 refills | Status: DC | PRN

## 2022-09-22 ENCOUNTER — Other Ambulatory Visit: Payer: Self-pay | Admitting: Internal Medicine

## 2022-09-22 ENCOUNTER — Other Ambulatory Visit: Payer: Self-pay | Admitting: Obstetrics & Gynecology

## 2022-09-22 DIAGNOSIS — F419 Anxiety disorder, unspecified: Secondary | ICD-10-CM

## 2022-09-24 ENCOUNTER — Telehealth: Payer: Self-pay | Admitting: Internal Medicine

## 2022-09-24 NOTE — Telephone Encounter (Signed)
Patient called in time to up dosage on   Dulaglutide (TRULICITY) 1.5 MG/0.5ML [facility] [address], [place] [address], [place] [address] Phone: [phone]  Fax: [phone]

## 2022-10-16 ENCOUNTER — Other Ambulatory Visit: Payer: Self-pay | Admitting: Nurse Practitioner

## 2022-10-20 ENCOUNTER — Ambulatory Visit: Payer: 59 | Admitting: Internal Medicine

## 2022-10-20 ENCOUNTER — Encounter: Payer: Self-pay | Admitting: Internal Medicine

## 2022-10-20 VITALS — BP 120/73 | HR 87 | Ht 64.0 in | Wt 264.6 lb

## 2022-10-20 DIAGNOSIS — E119 Type 2 diabetes mellitus without complications: Secondary | ICD-10-CM | POA: Diagnosis not present

## 2022-10-20 DIAGNOSIS — E559 Vitamin D deficiency, unspecified: Secondary | ICD-10-CM | POA: Diagnosis not present

## 2022-10-20 DIAGNOSIS — Z6841 Body Mass Index (BMI) 40.0 and over, adult: Secondary | ICD-10-CM | POA: Diagnosis not present

## 2022-10-20 LAB — POCT GLYCOSYLATED HEMOGLOBIN (HGB A1C): HbA1c, POC (prediabetic range): 5.7 % (ref 5.7–6.4)

## 2022-10-20 MED ORDER — TRULICITY 1.5 MG/0.5ML ~~LOC~~ SOAJ: 1.5000 mg | SUBCUTANEOUS | 2 refills | Status: AC

## 2022-10-20 NOTE — Assessment & Plan Note (Signed)
BMI 45.4 today.  Her weight today is 264 lbs and was 263 lbs at her last appointment.  She is interested in additional weight loss options today. -I reviewed with [name] that 1 positive side effect of GLP-1 therapy is often weight loss.  Additional therapy is not indicated.  We reviewed her current dieting and exercise practices.  She endorsed that there is room for improvement in regard to lifestyle modifications and weight loss.  Of note, she is also prescribed Megace for menorrhagia.  This is likely contributing to her inability to lose weight as well.  I recommended that she discuss this with her OB/GYN to see if other treatment options are available.

## 2022-10-20 NOTE — Patient Instructions (Signed)
It was a pleasure to see you today.  Thank you for giving [place] the opportunity to be involved in your care.  Below is a brief recap of your visit and next steps.  We will plan to see you again in 6 months.  Summary No medication changes today We will check your vitamin D level A1c looks great Follow up in 6 months

## 2022-10-20 NOTE — Assessment & Plan Note (Signed)
POC A1c today is 5.7.  She is currently prescribed metformin XR 500 mg daily and Trulicity 1.5 mg weekly injections.  She has not experienced any side effects. -Congratulated on progress with excellent glycemic control -No medication changes today -DM related preventative care labs are up-to-date

## 2022-10-20 NOTE — Assessment & Plan Note (Signed)
Last vitamin D level 10 in May.  She remains on weekly, high-dose vitamin D supplementation.  Repeat vitamin D level has been ordered today.

## 2022-10-20 NOTE — Progress Notes (Signed)
Established Patient Office Visit  Subjective   Patient ID: [name], female    DOB: 03-30-1981  Age: 42 y.o. MRN: [id number]  Chief Complaint  Patient presents with   Diabetes    Follow up   [name] returns to care today for DM follow-up.  Last seen by me on 11/7.  There have been no acute interval events. [name] reports feeling well today.  She expresses frustration over an inability to lose weight.  She is otherwise asymptomatic and has no acute concerns to discuss.  Past Medical History:  Diagnosis Date   Anxiety    Arthritis    Depression    Hypercholesteremia    Sinus infection 03/27/2021   Past Surgical History:  Procedure Laterality Date   BREAST BIOPSY Left 04/03/2021   Procedure: BREAST BIOPSY; excision of duct lesion;  Surgeon: Lucretia Roers, MD;  Location: [facility];  Service: General;  Laterality: Left;   CHOLECYSTECTOMY N/A 07/13/2014   Procedure: LAPAROSCOPIC CHOLECYSTECTOMY;  Surgeon: Marlane Hatcher, MD;  Location: [facility];  Service: General;  Laterality: N/A;   DENTAL SURGERY  2018   plate top & bottom   DILATATION AND CURETTAGE/HYSTEROSCOPY WITH MINERVA N/A 04/16/2022   Procedure: DILATATION AND CURETTAGE/HYSTEROSCOPY WITH MINERVA;  Surgeon: Lazaro Arms, MD;  Location: [facility];  Service: Gynecology;  Laterality: N/A;   TUBAL LIGATION     Social History   Tobacco Use   Smoking status: Former    Packs/day: 0.50    Years: 15.00    Total pack years: 7.50    Types: Cigarettes    Quit date: 11/14/2019    Years since quitting: 2.9   Smokeless tobacco: Never  Vaping Use   Vaping Use: Never used  Substance Use Topics   Alcohol use: No   Drug use: No   Family History  Problem Relation Age of Onset   Heart disease Mother    Diabetes Mother    Heart disease Father    Hypertension Father    Thyroid disease Father    Hyperlipidemia Father    Breast cancer Paternal Aunt    Cancer - Colon Paternal Uncle    Breast cancer Paternal Grandmother     No Known Allergies    Review of Systems  Constitutional:  Negative for chills and fever.  HENT:  Negative for sore throat.   Respiratory:  Negative for cough and shortness of breath.   Cardiovascular:  Negative for chest pain, palpitations and leg swelling.  Gastrointestinal:  Negative for abdominal pain, blood in stool, constipation, diarrhea, nausea and vomiting.  Genitourinary:  Negative for dysuria and hematuria.  Musculoskeletal:  Negative for myalgias.  Skin:  Negative for itching and rash.  Neurological:  Negative for dizziness and headaches.  Psychiatric/Behavioral:  Negative for depression and suicidal ideas.       Objective:     BP 120/73   Pulse 87   Ht 5\' 4"  (1.626 m)   Wt 264 lb 9.6 oz (120 kg)   SpO2 98%   BMI 45.42 kg/m  BP Readings from Last 3 Encounters:  10/20/22 120/73  08/19/22 130/74  07/22/22 124/77      Physical Exam Vitals reviewed.  Constitutional:      General: She is not in acute distress.    Appearance: Normal appearance. She is obese. She is not toxic-appearing.  HENT:     Head: Normocephalic and atraumatic.     Right Ear: External ear normal.  Left Ear: External ear normal.     Nose: Nose normal. No congestion or rhinorrhea.     Mouth/Throat:     Mouth: Mucous membranes are moist.     Pharynx: Oropharynx is clear. No oropharyngeal exudate or posterior oropharyngeal erythema.  Eyes:     General: No scleral icterus.    Extraocular Movements: Extraocular movements intact.     Conjunctiva/sclera: Conjunctivae normal.     Pupils: Pupils are equal, round, and reactive to light.  Cardiovascular:     Rate and Rhythm: Normal rate and regular rhythm.     Pulses: Normal pulses.     Heart sounds: Normal heart sounds. No murmur heard.    No friction rub. No gallop.  Pulmonary:     Effort: Pulmonary effort is normal.     Breath sounds: Normal breath sounds. No wheezing, rhonchi or rales.  Abdominal:     General: Abdomen is flat.  Bowel sounds are normal. There is no distension.     Palpations: Abdomen is soft.     Tenderness: There is no abdominal tenderness.  Musculoskeletal:        General: No swelling. Normal range of motion.     Cervical back: Normal range of motion.     Right lower leg: No edema.     Left lower leg: No edema.  Lymphadenopathy:     Cervical: No cervical adenopathy.  Skin:    General: Skin is warm and dry.     Capillary Refill: Capillary refill takes less than 2 seconds.     Coloration: Skin is not jaundiced.  Neurological:     General: No focal deficit present.     Mental Status: She is alert and oriented to person, place, and time.  Psychiatric:        Mood and Affect: Mood normal.        Behavior: Behavior normal.    Results for orders placed or performed in visit on 10/20/22  POCT glycosylated hemoglobin (Hb A1C)  Result Value Ref Range   Hemoglobin A1C     HbA1c POC (<> result, manual entry)     HbA1c, POC (prediabetic range) 5.7 5.7 - 6.4 %   HbA1c, POC (controlled diabetic range)     Last CBC Lab Results  Component Value Date   WBC 5.3 04/14/2022   HGB 12.0 04/14/2022   HCT 36.2 04/14/2022   MCV 87.7 04/14/2022   MCH 29.1 04/14/2022   RDW 13.9 04/14/2022   PLT 288 56/21/3086   Last metabolic panel Lab Results  Component Value Date   GLUCOSE 101 (H) 07/18/2022   NA 141 07/18/2022   K 4.3 07/18/2022   CL 109 (H) 07/18/2022   CO2 16 (L) 07/18/2022   BUN 13 07/18/2022   CREATININE 1.03 (H) 07/18/2022   EGFR 70 07/18/2022   CALCIUM 9.8 07/18/2022   PROT 7.2 07/18/2022   ALBUMIN 4.7 07/18/2022   LABGLOB 2.5 07/18/2022   AGRATIO 1.9 07/18/2022   BILITOT <0.2 07/18/2022   ALKPHOS 48 07/18/2022   AST 33 07/18/2022   ALT 41 (H) 07/18/2022   ANIONGAP 7 04/14/2022   Last lipids Lab Results  Component Value Date   CHOL 130 07/18/2022   HDL 29 (L) 07/18/2022   LDLCALC 77 07/18/2022   TRIG 134 07/18/2022   CHOLHDL 4.5 (H) 07/18/2022   Last hemoglobin A1c Lab  Results  Component Value Date   HGBA1C 5.7 10/20/2022   Last thyroid functions Lab Results  Component Value Date  TSH 1.230 02/10/2022   Last vitamin D Lab Results  Component Value Date   VD25OH 10.0 (L) 02/10/2022   The 10-year ASCVD risk score (Arnett DK, et al., 2019) is: 1.6%    Assessment & Plan:   Problem List Items Addressed This Visit       Type 2 diabetes mellitus without complications ([facility]) - Primary    POC A1c today is 5.7.  She is currently prescribed metformin XR 500 mg daily and Trulicity 1.5 mg weekly injections.  She has not experienced any side effects. -Congratulated on progress with excellent glycemic control -No medication changes today -DM related preventative care labs are up-to-date      Obesity, unspecified    BMI 45.4 today.  Her weight today is 264 lbs and was 263 lbs at her last appointment.  She is interested in additional weight loss options today. -I reviewed with Ms. Marsee that 1 positive side effect of GLP-1 therapy is often weight loss.  Additional therapy is not indicated.  We reviewed her current dieting and exercise practices.  She endorsed that there is room for improvement in regard to lifestyle modifications and weight loss.  Of note, she is also prescribed Megace for menorrhagia.  This is likely contributing to her inability to lose weight as well.  I recommended that she discuss this with her OB/GYN to see if other treatment options are available.      Vitamin D deficiency    Last vitamin D level 10 in May.  She remains on weekly, high-dose vitamin D supplementation.  Repeat vitamin D level has been ordered today.       Return in about 6 months (around 04/20/2023).    Billie Lade, MD

## 2022-10-21 LAB — VITAMIN D 25 HYDROXY (VIT D DEFICIENCY, FRACTURES): Vit D, 25-Hydroxy: 20.3 ng/mL — ABNORMAL LOW (ref 30.0–100.0)

## 2022-10-24 ENCOUNTER — Other Ambulatory Visit: Payer: Self-pay | Admitting: Internal Medicine

## 2022-10-24 DIAGNOSIS — F419 Anxiety disorder, unspecified: Secondary | ICD-10-CM

## 2022-10-26 ENCOUNTER — Other Ambulatory Visit: Payer: Self-pay | Admitting: Family Medicine

## 2022-10-26 DIAGNOSIS — E559 Vitamin D deficiency, unspecified: Secondary | ICD-10-CM

## 2022-10-31 ENCOUNTER — Telehealth: Payer: Self-pay | Admitting: Internal Medicine

## 2022-10-31 NOTE — Telephone Encounter (Signed)
Patient called in regard to   Dulaglutide (TRULICITY) 1.5 OT/1.5BW SOPN  States that [facility] is out of current dosage.   Patient and provider discussed upping dosage  to 3MG  .  [facility] can get 3MG  in stock patient wants a call back.

## 2022-11-04 ENCOUNTER — Other Ambulatory Visit: Payer: Self-pay

## 2022-11-04 ENCOUNTER — Encounter: Payer: Self-pay | Admitting: Orthopedic Surgery

## 2022-11-04 ENCOUNTER — Ambulatory Visit: Payer: 59 | Admitting: Orthopedic Surgery

## 2022-11-04 VITALS — Ht 64.0 in | Wt 264.0 lb

## 2022-11-04 DIAGNOSIS — M65311 Trigger thumb, right thumb: Secondary | ICD-10-CM | POA: Diagnosis not present

## 2022-11-04 MED ORDER — TRULICITY 3 MG/0.5ML ~~LOC~~ SOAJ: 3.0000 mg | SUBCUTANEOUS | 1 refills | Status: DC

## 2022-11-04 NOTE — Patient Instructions (Signed)

## 2022-11-04 NOTE — Progress Notes (Signed)
Return patient Visit  Assessment: [name] is a 42 y.o. female with the following: Right trigger thumb  Plan: [name] has recurrence of her right thumb trigger finger.  Prior injection lasted several months.  She is interested in another injection.  We gave her a splint to be worn on the thumb.  I will see her back as needed.   Procedure note injection - Right Thumb A1 Pulley  Verbal consent was obtained to inject the Right Thumb A1 pulley Timeout was completed to confirm the site of injection.  The skin was prepped with alcohol and ethyl chloride was sprayed at the injection site.  A 21-gauge needle was used to inject 40 mg of Depo-Medrol and 1% lidocaine (1 cc) into the Right Thumb using a direct anterior approach.  There were no complications. Patient tolerated the procedure well. A sterile bandage was applied   Follow-up: Return if symptoms worsen or fail to improve.  Subjective:  Chief Complaint  Patient presents with   Hand Pain    Rt thumb triggering again for 1.5 months and getting worse. Would like another injection.   Qulin: [id number] Lot: [id number] Exp: 03/25     History of Present Illness: [name] is a 42 y.o. RHD female who returns for evaluation of Right Thumb pain.  I saw her several months ago for similar type complaints.  She had a steroid injection, which provided sustained relief of her symptoms for several months.  Pain returned approximately 1-2 months ago.  She is interested in another injection.  No pain in additional fingers.  Symptoms are worse in the morning.  Review of Systems: No fevers or chills No numbness or tingling No chest pain No shortness of breath No bowel or bladder dysfunction No GI distress No headaches   Objective: Ht 5\' 4"  (1.626 m)   Wt 264 lb (119.7 kg)   BMI 45.32 kg/m   Physical Exam:  General: Alert and oriented. and No acute distress. Gait: Normal gait.  Right hand without deformity.  No  tenderness to palpation over the first dorsal compartment.  Tenderness palpation over the A1 pulley to the right thumb.  No active triggering is appreciated in clinic today.  She is able to make a full fist.  Fingers are warm and well-perfused.   IMAGING: No new imaging obtained today   New Medications:  No orders of the defined types were placed in this encounter.     Mordecai Rasmussen, MD  11/04/2022 1:28 PM

## 2022-11-28 ENCOUNTER — Other Ambulatory Visit: Payer: Self-pay | Admitting: Internal Medicine

## 2022-11-28 ENCOUNTER — Other Ambulatory Visit: Payer: Self-pay | Admitting: Nurse Practitioner

## 2022-11-28 DIAGNOSIS — F419 Anxiety disorder, unspecified: Secondary | ICD-10-CM

## 2022-12-11 ENCOUNTER — Encounter: Payer: Self-pay | Admitting: Radiology

## 2022-12-11 ENCOUNTER — Other Ambulatory Visit: Payer: Self-pay | Admitting: Internal Medicine

## 2022-12-11 DIAGNOSIS — E119 Type 2 diabetes mellitus without complications: Secondary | ICD-10-CM

## 2022-12-11 MED ORDER — METFORMIN HCL ER 500 MG PO TB24: 500.0000 mg | ORAL_TABLET | Freq: Every day | ORAL | 2 refills | Status: DC

## 2022-12-12 ENCOUNTER — Other Ambulatory Visit: Payer: Self-pay | Admitting: Internal Medicine

## 2022-12-12 DIAGNOSIS — E559 Vitamin D deficiency, unspecified: Secondary | ICD-10-CM

## 2022-12-12 MED ORDER — VITAMIN D (ERGOCALCIFEROL) 1.25 MG (50000 UNIT) PO CAPS: 50000.0000 [IU] | ORAL_CAPSULE | ORAL | 0 refills | Status: DC

## 2022-12-12 MED ORDER — MELOXICAM 15 MG PO TABS: ORAL_TABLET | ORAL | 0 refills | Status: DC

## 2022-12-25 ENCOUNTER — Other Ambulatory Visit: Payer: Self-pay | Admitting: Internal Medicine

## 2022-12-25 DIAGNOSIS — F419 Anxiety disorder, unspecified: Secondary | ICD-10-CM

## 2022-12-29 ENCOUNTER — Other Ambulatory Visit: Payer: Self-pay

## 2022-12-29 MED ORDER — SIMVASTATIN 40 MG PO TABS: 40.0000 mg | ORAL_TABLET | Freq: Every day | ORAL | 0 refills | Status: DC

## 2023-01-05 ENCOUNTER — Other Ambulatory Visit: Payer: Self-pay | Admitting: Internal Medicine

## 2023-01-08 ENCOUNTER — Ambulatory Visit
Admission: EM | Admit: 2023-01-08 | Discharge: 2023-01-08 | Disposition: A | Discharge status: Home / Self Care | Payer: 59 | Attending: Nurse Practitioner | Admitting: Nurse Practitioner

## 2023-01-08 DIAGNOSIS — J069 Acute upper respiratory infection, unspecified: Secondary | ICD-10-CM | POA: Diagnosis not present

## 2023-01-08 MED ORDER — PROMETHAZINE-DM 6.25-15 MG/5ML PO SYRP: 5.0000 mL | ORAL_SOLUTION | Freq: Every evening | ORAL | 0 refills | Status: DC | PRN

## 2023-01-08 MED ORDER — BENZONATATE 100 MG PO CAPS: 200.0000 mg | ORAL_CAPSULE | Freq: Three times a day (TID) | ORAL | 0 refills | Status: DC | PRN

## 2023-01-08 NOTE — Discharge Instructions (Addendum)
You have a viral upper respiratory infection.  Symptoms should improve over the next week to 10 days.  If you develop chest pain or shortness of breath, go to the emergency room.  Some things that can make you feel better are: - Increased rest - Increasing fluid with water/sugar free electrolytes - Acetaminophen and ibuprofen as needed for fever/pain - Salt water gargling, chloraseptic spray and throat lozenges - OTC guaifenesin (Mucinex) 600 mg twice daily for congestion - Saline sinus flushes or a neti pot - Humidifying the air -Tessalon Perles during the day as needed for dry cough and cough syrup at nighttime as needed for dry cough 

## 2023-01-08 NOTE — ED Triage Notes (Signed)
Pt reports cough, headache and nasal congestion x 6 days.OTC cold meds gives no relief.

## 2023-01-08 NOTE — ED Provider Notes (Signed)
[facility]    CSN: [id number] Arrival date & time: 01/08/23  1404      History   Chief Complaint Chief Complaint  Patient presents with   Cough    HPI Cecillia A Haldeman is a 42 y.o. female.   Patient presents today for 6-day history of dry cough during the day, congested cough first thing in the morning and when she lays down at night.  Also having chest pain and tightness when she coughs, chest and nasal congestion, runny nose, sore throat, headache behind her eyes from coughing so hard, decreased appetite, and chills 2 nights ago.  Patient denies recent fever, body aches, shortness of breath, ear pain, abdominal pain, nausea/vomiting, diarrhea, loss of taste or smell, new rash, and new fatigue.  Reports her son is sick with similar symptoms.  Has been taking over-the-counter cold and flu medication, nasal ZzzQuil for symptoms without much benefit.  Patient reports she quit smoking 5 years ago and used to smoke cigarettes.  Reports she had asthma as a child and sometimes has to have inhalers when she is sick.     Past Medical History:  Diagnosis Date   Anxiety    Arthritis    Depression    Hypercholesteremia    Sinus infection 03/27/2021    Patient Active Problem List   Diagnosis Date Noted   H/O seasonal allergies 08/25/2022   Type 2 diabetes mellitus without complications ([place]) [id number]   Need for Tdap vaccination 07/22/2022   Menorrhagia with regular cycle    Vitamin D deficiency 02/18/2022   Annual physical exam 02/18/2022   Prediabetes 02/18/2022   Refused influenza vaccine 12/17/2021   Hot flashes 12/17/2021   Bloody discharge from left nipple 03/21/2021   Right hip and buttock pain, ishial bursitis 07/20/2020   Anxiety 03/29/2019   Contact dermatitis 03/29/2019   Dysmenorrhea 06/30/2018   Hyperlipidemia 09/27/2015   Obesity, unspecified 09/27/2015   Cigarette nicotine dependence, uncomplicated [id number]   Cholecystitis with cholelithiasis  07/13/2014    Past Surgical History:  Procedure Laterality Date   BREAST BIOPSY Left 04/03/2021   Procedure: BREAST BIOPSY; excision of duct lesion;  Surgeon: Virl Cagey, MD;  Location: [facility];  Service: General;  Laterality: Left;   CHOLECYSTECTOMY N/A 07/13/2014   Procedure: LAPAROSCOPIC CHOLECYSTECTOMY;  Surgeon: Scherry Ran, MD;  Location: [facility];  Service: General;  Laterality: N/A;   DENTAL SURGERY  2018   plate top & bottom   DILATATION AND CURETTAGE/HYSTEROSCOPY WITH MINERVA N/A 04/16/2022   Procedure: DILATATION AND CURETTAGE/HYSTEROSCOPY WITH MINERVA;  Surgeon: Florian Buff, MD;  Location: [facility];  Service: Gynecology;  Laterality: N/A;   TUBAL LIGATION      OB History     Gravida  2   Para  2   Term      Preterm      AB      Living  2      SAB      IAB      Ectopic      Multiple      Live Births               Home Medications    Prior to Admission medications   Medication Sig Start Date End Date Taking? Authorizing Provider  benzonatate (TESSALON) 100 MG capsule Take 2 capsules (200 mg total) by mouth 3 (three) times daily as needed for cough. Do not take with alcohol or while driving or operating heavy  machinery.  May cause drowsiness. 01/08/23  Yes Eulogio Bear, NP  promethazine-dextromethorphan (PROMETHAZINE-DM) 6.25-15 MG/5ML syrup Take 5 mLs by mouth at bedtime as needed for cough. Do not take with alcohol or while driving or operating heavy machinery.  May cause drowsiness. 01/08/23  Yes Eulogio Bear, NP  citalopram (CELEXA) 40 MG tablet Take 1 tablet by mouth once daily 12/25/22   Johnette Abraham, MD  Dulaglutide (TRULICITY) 1.5 0000000 SOPN Inject 1.5 mg into the skin once a week. 10/20/22 01/12/23  Johnette Abraham, MD  Dulaglutide (TRULICITY) 3 0000000 SOPN Inject 3 mg as directed once a week. 11/04/22   Johnette Abraham, MD  fenofibrate (TRICOR) 145 MG tablet Take 1 tablet (145 mg total) by mouth daily. 05/30/22    Renee Rival, FNP  megestrol (MEGACE) 40 MG tablet Take 1 tablet (40 mg total) by mouth daily. 07/30/22   Johnette Abraham, MD  meloxicam (MOBIC) 15 MG tablet TAKE 1 TABLET BY MOUTH ONCE DAILY AS NEEDED FOR PAIN Strength: 15 mg 12/12/22   Johnette Abraham, MD  metFORMIN (GLUCOPHAGE-XR) 500 MG 24 hr tablet Take 1 tablet (500 mg total) by mouth daily with breakfast. 12/11/22 03/11/23  Johnette Abraham, MD  norethindrone (MICRONOR) 0.35 MG tablet Take 1 tablet by mouth daily 05/19/22   Cresenzo-Dishmon, Joaquim Lai, CNM  pantoprazole (PROTONIX) 40 MG tablet Take 1 tablet (40 mg total) by mouth daily. 05/07/21   Soyla Dryer, PA-C  simvastatin (ZOCOR) 40 MG tablet Take 1 tablet (40 mg total) by mouth at bedtime. 12/29/22   Johnette Abraham, MD  Vitamin D, Ergocalciferol, (DRISDOL) 1.25 MG (50000 UNIT) CAPS capsule Take 1 capsule (50,000 Units total) by mouth once a week. 12/12/22   Johnette Abraham, MD    Family History Family History  Problem Relation Age of Onset   Heart disease Mother    Diabetes Mother    Heart disease Father    Hypertension Father    Thyroid disease Father    Hyperlipidemia Father    Breast cancer Paternal Aunt    Cancer - Colon Paternal Uncle    Breast cancer Paternal Grandmother     Social History Social History   Tobacco Use   Smoking status: Former    Packs/day: 0.50    Years: 15.00    Additional pack years: 0.00    Total pack years: 7.50    Types: Cigarettes    Quit date: 11/14/2019    Years since quitting: 3.1   Smokeless tobacco: Never  Vaping Use   Vaping Use: Never used  Substance Use Topics   Alcohol use: No   Drug use: No     Allergies   Patient has no known allergies.   Review of Systems Review of Systems Per HPI  Physical Exam Triage Vital Signs ED Triage Vitals  Enc Vitals Group     BP 01/08/23 1407 136/77     Pulse Rate 01/08/23 1407 97     Resp 01/08/23 1407 20     Temp 01/08/23 1407 98.2 F (36.8 C)     Temp Source 01/08/23  1407 Oral     SpO2 01/08/23 1407 98 %     Weight --      Height --      Head Circumference --      Peak Flow --      Pain Score 01/08/23 1408 9     Pain Loc --      Pain  Edu? --      Excl. in [place]? --    No data found.  Updated Vital Signs BP 136/77 (BP Location: Right Arm)   Pulse 97   Temp 98.2 F (36.8 C) (Oral)   Resp 20   SpO2 98%   Visual Acuity Right Eye Distance:   Left Eye Distance:   Bilateral Distance:    Right Eye Near:   Left Eye Near:    Bilateral Near:     Physical Exam Vitals and nursing note reviewed.  Constitutional:      General: She is not in acute distress.    Appearance: Normal appearance. She is not ill-appearing or toxic-appearing.  HENT:     Head: Normocephalic and atraumatic.     Right Ear: Tympanic membrane, ear canal and external ear normal.     Left Ear: Tympanic membrane, ear canal and external ear normal.     Nose: No congestion or rhinorrhea.     Mouth/Throat:     Mouth: Mucous membranes are moist.     Pharynx: Oropharynx is clear. Posterior oropharyngeal erythema present. No oropharyngeal exudate.     Comments: Post nasal drainage Eyes:     General: No scleral icterus.    Extraocular Movements: Extraocular movements intact.  Cardiovascular:     Rate and Rhythm: Normal rate and regular rhythm.  Pulmonary:     Effort: Pulmonary effort is normal. No respiratory distress.     Breath sounds: Normal breath sounds. No wheezing, rhonchi or rales.  Abdominal:     General: Abdomen is flat. Bowel sounds are normal. There is no distension.     Palpations: Abdomen is soft.     Tenderness: There is no abdominal tenderness.  Musculoskeletal:     Cervical back: Normal range of motion and neck supple.  Lymphadenopathy:     Cervical: No cervical adenopathy.  Skin:    General: Skin is warm and dry.     Coloration: Skin is not jaundiced or pale.     Findings: No erythema or rash.  Neurological:     Mental Status: She is alert and oriented to  person, place, and time.  Psychiatric:        Behavior: Behavior is cooperative.      UC Treatments / Results  Labs (all labs ordered are listed, but only abnormal results are displayed) Labs Reviewed - No data to display  EKG   Radiology No results found.  Procedures Procedures (including critical care time)  Medications Ordered in UC Medications - No data to display  Initial Impression / Assessment and Plan / UC Course  I have reviewed the triage vital signs and the nursing notes.  Pertinent labs & imaging results that were available during my care of the patient were reviewed by me and considered in my medical decision making (see chart for details).   Patient is well-appearing, normotensive, afebrile, not tachycardic, not tachypneic, oxygenating well on room air.    1. Viral URI with cough Suspect viral etiology Vital signs and examination today are reassuring Viral testing deferred given length of symptoms Supportive care discussed-start cough suppressant ER and return precautions discussed with patient  The patient was given the opportunity to ask questions.  All questions answered to their satisfaction.  The patient is in agreement to this plan.    Final Clinical Impressions(s) / UC Diagnoses   Final diagnoses:  Viral URI with cough     Discharge Instructions      You have  a viral upper respiratory infection.  Symptoms should improve over the next week to 10 days.  If you develop chest pain or shortness of breath, go to the emergency room.  Some things that can make you feel better are: - Increased rest - Increasing fluid with water/sugar free electrolytes - Acetaminophen and ibuprofen as needed for fever/pain - Salt water gargling, chloraseptic spray and throat lozenges - OTC guaifenesin (Mucinex) 600 mg twice daily for congestion - Saline sinus flushes or a neti pot - Humidifying the air -Tessalon Perles during the day as needed for dry cough and  cough syrup at nighttime as needed for dry cough     ED Prescriptions     Medication Sig Dispense Auth. Provider   benzonatate (TESSALON) 100 MG capsule Take 2 capsules (200 mg total) by mouth 3 (three) times daily as needed for cough. Do not take with alcohol or while driving or operating heavy machinery.  May cause drowsiness. 21 capsule Noemi Chapel A, NP   promethazine-dextromethorphan (PROMETHAZINE-DM) 6.25-15 MG/5ML syrup Take 5 mLs by mouth at bedtime as needed for cough. Do not take with alcohol or while driving or operating heavy machinery.  May cause drowsiness. 118 mL Eulogio Bear, NP      PDMP not reviewed this encounter.   Eulogio Bear, NP 01/08/23 1431

## 2023-01-16 ENCOUNTER — Other Ambulatory Visit: Payer: Self-pay | Admitting: Nurse Practitioner

## 2023-01-16 ENCOUNTER — Telehealth: Payer: Self-pay | Admitting: Internal Medicine

## 2023-01-16 DIAGNOSIS — Z889 Allergy status to unspecified drugs, medicaments and biological substances status: Secondary | ICD-10-CM

## 2023-01-16 NOTE — Telephone Encounter (Signed)
Wants to know if old rx for Flonase can be refilled? 

## 2023-01-19 MED ORDER — FLUTICASONE PROPIONATE 50 MCG/ACT NA SUSP
2.0000 | Freq: Every day | NASAL | 6 refills | Status: DC
Start: 2023-01-19 — End: 2023-12-08

## 2023-01-20 LAB — HM DIABETES EYE EXAM

## 2023-01-22 ENCOUNTER — Other Ambulatory Visit: Payer: Self-pay | Admitting: Internal Medicine

## 2023-01-22 ENCOUNTER — Other Ambulatory Visit: Payer: Self-pay | Admitting: Obstetrics & Gynecology

## 2023-01-22 DIAGNOSIS — F419 Anxiety disorder, unspecified: Secondary | ICD-10-CM

## 2023-01-30 ENCOUNTER — Other Ambulatory Visit: Payer: Self-pay | Admitting: Internal Medicine

## 2023-02-16 ENCOUNTER — Other Ambulatory Visit: Payer: Self-pay | Admitting: Internal Medicine

## 2023-02-16 DIAGNOSIS — F419 Anxiety disorder, unspecified: Secondary | ICD-10-CM

## 2023-02-18 ENCOUNTER — Encounter: Payer: Self-pay | Admitting: Internal Medicine

## 2023-02-18 ENCOUNTER — Ambulatory Visit: Payer: 59 | Admitting: Internal Medicine

## 2023-02-18 VITALS — BP 120/76 | HR 84 | Ht 65.0 in | Wt 257.0 lb

## 2023-02-18 DIAGNOSIS — R197 Diarrhea, unspecified: Secondary | ICD-10-CM | POA: Insufficient documentation

## 2023-02-18 DIAGNOSIS — R1011 Right upper quadrant pain: Secondary | ICD-10-CM | POA: Insufficient documentation

## 2023-02-18 NOTE — Assessment & Plan Note (Signed)
She endorses a 1 week history of dull RUQ pain.  Surgical history is significant for cholecystectomy.  Symptom onset has occurred in the setting of watery diarrhea.  Suspect symptoms are related. -Checking labs/stool studies today -Consider CT if symptoms persist with unrevealing labs

## 2023-02-18 NOTE — Patient Instructions (Signed)
It was a pleasure to see you today.  Thank you for giving [place] the opportunity to be involved in your care.  Below is a brief recap of your visit and next steps.  We will plan to see you again in 2 weeks.  Summary Check labs and stool studies Be sure to stay well hydrated Follow up in 2 weeks

## 2023-02-18 NOTE — Progress Notes (Signed)
Acute Office Visit  Subjective:     Patient ID: [name], female    DOB: [birth year], 42 y.o.   MRN: [id number]  Chief Complaint  Patient presents with   GI Problem    Stomach pains and cramps since 02/11/23 also right upper quad pain   Diarrhea    Watery diarrhea since 01/28/2023   [name] presents today for an acute visit endorsing a 3-week history of watery diarrhea.  She is currently having 4-5 episodes of watery diarrhea each day.  Stools for the last 3 weeks have all been diarrhea.  She has not had formed stools intermittently.  She denies fever/chills, has not appreciated any blood in her stool, unaware of any sick contacts, no recent antibiotic use, and has not identified any triggers of her symptoms.  She has tried Pepto-Bismol and Imodium for symptom relief without significant improvement.  She additionally endorses a 1 week history of dull, constant right upper quadrant abdominal pain.  Surgical history is significant for cholecystectomy.  Review of Systems  Gastrointestinal:  Positive for abdominal pain (RUQ) and diarrhea.      Objective:    BP 120/76   Pulse 84   Ht 5\' 5"  (1.651 m)   Wt 257 lb (116.6 kg)   SpO2 97%   BMI 42.77 kg/m  BP Readings from Last 3 Encounters:  02/18/23 120/76  01/08/23 136/77  10/20/22 120/73   Physical Exam Vitals reviewed.  Constitutional:      General: She is not in acute distress.    Appearance: Normal appearance. She is obese. She is not toxic-appearing.  HENT:     Head: Normocephalic and atraumatic.     Right Ear: External ear normal.     Left Ear: External ear normal.     Nose: Nose normal. No congestion or rhinorrhea.     Mouth/Throat:     Mouth: Mucous membranes are moist.     Pharynx: Oropharynx is clear. No oropharyngeal exudate or posterior oropharyngeal erythema.  Eyes:     General: No scleral icterus.    Extraocular Movements: Extraocular movements intact.     Conjunctiva/sclera: Conjunctivae normal.      Pupils: Pupils are equal, round, and reactive to light.  Cardiovascular:     Rate and Rhythm: Normal rate and regular rhythm.     Pulses: Normal pulses.     Heart sounds: Normal heart sounds. No murmur heard.    No friction rub. No gallop.  Pulmonary:     Effort: Pulmonary effort is normal.     Breath sounds: Normal breath sounds. No wheezing, rhonchi or rales.  Abdominal:     General: Abdomen is flat. Bowel sounds are normal. There is no distension.     Palpations: Abdomen is soft.     Tenderness: There is abdominal tenderness (Dull TTP of RUQ).  Musculoskeletal:        General: No swelling. Normal range of motion.     Cervical back: Normal range of motion.     Right lower leg: No edema.     Left lower leg: No edema.  Lymphadenopathy:     Cervical: No cervical adenopathy.  Skin:    General: Skin is warm and dry.     Capillary Refill: Capillary refill takes less than 2 seconds.     Coloration: Skin is not jaundiced.  Neurological:     General: No focal deficit present.     Mental Status: She is alert and oriented to person,  place, and time.  Psychiatric:        Mood and Affect: Mood normal.        Behavior: Behavior normal.       Assessment & Plan:   Problem List Items Addressed This Visit       Diarrhea - Primary    Presenting today endorsing a 3-week history of watery diarrhea.  She has not had formed stools intermittently.  Largely denies further associated symptoms aside from recent development of RUQ pain. -Stool studies ordered today: Culture, O&P, GI PCR panel -Check CBC and CMP as well -Recommend aggressive hydration and as needed use of Tylenol for pain relief. -We will tentatively plan for follow-up in 2 weeks for reassessment      Abdominal pain, RUQ    She endorses a 1 week history of dull RUQ pain.  Surgical history is significant for cholecystectomy.  Symptom onset has occurred in the setting of watery diarrhea.  Suspect symptoms are related. -Checking  labs/stool studies today -Consider CT if symptoms persist with unrevealing labs      Return in about 2 weeks (around 03/04/2023) for diarrhea.  Billie Lade, MD

## 2023-02-18 NOTE — Assessment & Plan Note (Signed)
Presenting today endorsing a 3-week history of watery diarrhea.  She has not had formed stools intermittently.  Largely denies further associated symptoms aside from recent development of RUQ pain. -Stool studies ordered today: Culture, O&P, GI PCR panel -Check CBC and CMP as well -Recommend aggressive hydration and as needed use of Tylenol for pain relief. -We will tentatively plan for follow-up in 2 weeks for reassessment

## 2023-02-19 ENCOUNTER — Other Ambulatory Visit: Payer: Self-pay

## 2023-02-19 DIAGNOSIS — M9902 Segmental and somatic dysfunction of thoracic region: Secondary | ICD-10-CM | POA: Diagnosis not present

## 2023-02-19 DIAGNOSIS — M7061 Trochanteric bursitis, right hip: Secondary | ICD-10-CM | POA: Diagnosis not present

## 2023-02-19 DIAGNOSIS — D509 Iron deficiency anemia, unspecified: Secondary | ICD-10-CM

## 2023-02-19 DIAGNOSIS — M9903 Segmental and somatic dysfunction of lumbar region: Secondary | ICD-10-CM | POA: Diagnosis not present

## 2023-02-19 DIAGNOSIS — M9905 Segmental and somatic dysfunction of pelvic region: Secondary | ICD-10-CM | POA: Diagnosis not present

## 2023-02-19 LAB — CMP14+EGFR
ALT: 28 IU/L (ref 0–32)
AST: 25 IU/L (ref 0–40)
Albumin/Globulin Ratio: 1.8 (ref 1.2–2.2)
Albumin: 4.7 g/dL (ref 3.9–4.9)
Alkaline Phosphatase: 39 IU/L — ABNORMAL LOW (ref 44–121)
BUN/Creatinine Ratio: 12 (ref 9–23)
BUN: 13 mg/dL (ref 6–24)
Bilirubin Total: 0.2 mg/dL (ref 0.0–1.2)
CO2: 20 mmol/L (ref 20–29)
Calcium: 9.7 mg/dL (ref 8.7–10.2)
Chloride: 106 mmol/L (ref 96–106)
Creatinine, Ser: 1.09 mg/dL — ABNORMAL HIGH (ref 0.57–1.00)
Globulin, Total: 2.6 g/dL (ref 1.5–4.5)
Glucose: 76 mg/dL (ref 70–99)
Potassium: 4.5 mmol/L (ref 3.5–5.2)
Sodium: 140 mmol/L (ref 134–144)
Total Protein: 7.3 g/dL (ref 6.0–8.5)
eGFR: 65 mL/min/{1.73_m2} (ref >59)

## 2023-02-19 LAB — CBC WITH DIFFERENTIAL/PLATELET
Basophils Absolute: 0 10*3/uL (ref 0.0–0.2)
Basos: 1 %
EOS (ABSOLUTE): 0.1 10*3/uL (ref 0.0–0.4)
Eos: 2 %
Hematocrit: 32.3 % — ABNORMAL LOW (ref 34.0–46.6)
Hemoglobin: 9.8 g/dL — ABNORMAL LOW (ref 11.1–15.9)
Immature Grans (Abs): 0 10*3/uL (ref 0.0–0.1)
Immature Granulocytes: 0 %
Lymphocytes Absolute: 1.6 10*3/uL (ref 0.7–3.1)
Lymphs: 32 %
MCH: 23.7 pg — ABNORMAL LOW (ref 26.6–33.0)
MCHC: 30.3 g/dL — ABNORMAL LOW (ref 31.5–35.7)
MCV: 78 fL — ABNORMAL LOW (ref 79–97)
Monocytes Absolute: 0.5 10*3/uL (ref 0.1–0.9)
Monocytes: 10 %
Neutrophils Absolute: 2.7 10*3/uL (ref 1.4–7.0)
Neutrophils: 55 %
Platelets: 346 10*3/uL (ref 150–450)
RBC: 4.13 x10E6/uL (ref 3.77–5.28)
RDW: 15.2 % (ref 11.7–15.4)
WBC: 4.9 10*3/uL (ref 3.4–10.8)

## 2023-02-25 ENCOUNTER — Other Ambulatory Visit: Payer: Self-pay

## 2023-02-26 DIAGNOSIS — D509 Iron deficiency anemia, unspecified: Secondary | ICD-10-CM | POA: Diagnosis not present

## 2023-02-27 LAB — CBC WITH DIFFERENTIAL/PLATELET
Basophils Absolute: 0 10*3/uL (ref 0.0–0.2)
Basos: 1 %
EOS (ABSOLUTE): 0.1 10*3/uL (ref 0.0–0.4)
Eos: 3 %
Hematocrit: 31.3 % — ABNORMAL LOW (ref 34.0–46.6)
Hemoglobin: 9.5 g/dL — ABNORMAL LOW (ref 11.1–15.9)
Immature Grans (Abs): 0 10*3/uL (ref 0.0–0.1)
Immature Granulocytes: 0 %
Lymphocytes Absolute: 1.3 10*3/uL (ref 0.7–3.1)
Lymphs: 33 %
MCH: 23.5 pg — ABNORMAL LOW (ref 26.6–33.0)
MCHC: 30.4 g/dL — ABNORMAL LOW (ref 31.5–35.7)
MCV: 78 fL — ABNORMAL LOW (ref 79–97)
Monocytes Absolute: 0.4 10*3/uL (ref 0.1–0.9)
Monocytes: 10 %
Neutrophils Absolute: 2.1 10*3/uL (ref 1.4–7.0)
Neutrophils: 53 %
Platelets: 348 10*3/uL (ref 150–450)
RBC: 4.04 x10E6/uL (ref 3.77–5.28)
RDW: 15.5 % — ABNORMAL HIGH (ref 11.7–15.4)
WBC: 3.9 10*3/uL (ref 3.4–10.8)

## 2023-02-27 LAB — IRON,TIBC AND FERRITIN PANEL
Ferritin: 4 ng/mL — ABNORMAL LOW (ref 15–150)
Iron Saturation: 6 % — CL (ref 15–55)
Iron: 36 ug/dL (ref 27–159)
Total Iron Binding Capacity: 564 ug/dL (ref 250–450)
UIBC: 528 ug/dL — ABNORMAL HIGH (ref 131–425)

## 2023-02-28 ENCOUNTER — Other Ambulatory Visit: Payer: Self-pay | Admitting: Obstetrics & Gynecology

## 2023-02-28 ENCOUNTER — Other Ambulatory Visit: Payer: Self-pay | Admitting: Internal Medicine

## 2023-03-02 ENCOUNTER — Other Ambulatory Visit: Payer: Self-pay | Admitting: Internal Medicine

## 2023-03-02 DIAGNOSIS — D509 Iron deficiency anemia, unspecified: Secondary | ICD-10-CM

## 2023-03-03 ENCOUNTER — Other Ambulatory Visit: Payer: Self-pay | Admitting: Internal Medicine

## 2023-03-03 DIAGNOSIS — E119 Type 2 diabetes mellitus without complications: Secondary | ICD-10-CM

## 2023-03-03 NOTE — Progress Notes (Unsigned)
Referring Provider: Billie Lade, MD Primary Care Physician:  Billie Lade, MD Primary Gastroenterologist:  Dr. Marletta Lor  Chief Complaint  Patient presents with   Anemia    Iron deficiency anemia    HPI:   [name] is a 42 y.o. female presenting today at the request of Billie Lade, MD for iron deficiency anemia.  Reviewed recent office visit with PCP 02/18/2023.  Patient was presenting with chief complaint of 3-week history of watery diarrhea and RUQ abdominal pain.  It was suspected that the symptoms were related.  Labs and stool studies were ordered.  Stool studies have not been completed.  Reviewed labs.  Hemoglobin previously in the 12 range, down to 9.8 on 02/18/2023, 9.5 on 02/26/2023 with microcytic indices.  Found to have iron deficiency on 5/16 as well with iron 36, iron saturation 6%, ferritin 4.  PCP recommended starting daily iron and she was referred to GI for further evaluation.  Today: Reports some intermittent anemia in the past. No brbpr or melena. No abnormal vaginal bleeding. Doesn't have period due to medications. When she did have them, they were extremely heavy. Hasn't had a period in years. No hematuria.   NSAIDs: Meloxicam for hip pain. Has been on this for a little over a year. Takes twice a week. No other NSAIDs.   Having change in bowel habits over the last 5 weeks. Chronic constipation. BM about once a week if she was lucky. Now, she feels like she has to go to the bathroom and tries 3-4 times a day, but is only passing small amount of clear mucous. No similar symptoms in the past. Yesterday, she had some abdominal pain, generalized, and she finally had a BM. After that, the pain eased up. Hasn't ever tried anything OTC for constipation.   Some nausea but no vomiting. Has been losing weight unintentionally as she isn't eating as much due to nausea. Has history of heartburn. Was on Protonix BID, but this was prescribed at the [facility] and no  longer has this. Now taking OTC Prilosec daily and tums at night. When on Protonix BID, she still had breakthrough.  Feels Prilosec is more helpful than Protonix. Limiting fried/fatty/spicy foods and soda. Very rare dysphagia to something like bread.   Some intermittent fatigue. No SOB or CP.   Past Medical History:  Diagnosis Date   Anxiety    Arthritis    Depression    Diabetes (HCC)    GERD (gastroesophageal reflux disease)    Hypercholesteremia    Sinus infection 03/27/2021    Past Surgical History:  Procedure Laterality Date   BREAST BIOPSY Left 04/03/2021   Procedure: BREAST BIOPSY; excision of duct lesion;  Surgeon: Lucretia Roers, MD;  Location: [facility];  Service: General;  Laterality: Left;   CHOLECYSTECTOMY N/A 07/13/2014   Procedure: LAPAROSCOPIC CHOLECYSTECTOMY;  Surgeon: Marlane Hatcher, MD;  Location: [facility];  Service: General;  Laterality: N/A;   DENTAL SURGERY  2018   plate top & bottom   DILATATION AND CURETTAGE/HYSTEROSCOPY WITH MINERVA N/A 04/16/2022   Procedure: DILATATION AND CURETTAGE/HYSTEROSCOPY WITH MINERVA;  Surgeon: Lazaro Arms, MD;  Location: [facility];  Service: Gynecology;  Laterality: N/A;   TUBAL LIGATION      Current Outpatient Medications  Medication Sig Dispense Refill   citalopram (CELEXA) 40 MG tablet Take 1 tablet by mouth once daily 30 tablet 0   fenofibrate (TRICOR) 145 MG tablet Take 1 tablet by mouth once  daily 30 tablet 0   Ferrous Sulfate (IRON) 325 (65 Fe) MG TABS Take 1 tablet by mouth daily.     fluticasone (FLONASE) 50 MCG/ACT nasal spray Place 2 sprays into both nostrils daily. 16 g 6   ketorolac (TORADOL) 10 MG tablet TAKE 1 TABLET BY MOUTH EVERY 8 HOURS AS NEEDED 15 tablet 0   megestrol (MEGACE) 40 MG tablet Take 1 tablet (40 mg total) by mouth daily. 30 tablet 11   meloxicam (MOBIC) 15 MG tablet TAKE 1 TABLET BY MOUTH ONCE DAILY AS NEEDED FOR PAIN 30 tablet 0   metFORMIN (GLUCOPHAGE-XR) 500 MG 24 hr tablet Take 1 tablet by  mouth once daily with breakfast 30 tablet 0   norethindrone (MICRONOR) 0.35 MG tablet Take 1 tablet by mouth daily 84 tablet 3   omeprazole (PRILOSEC) 40 MG capsule Take 1 capsule (40 mg total) by mouth daily before breakfast. 30 capsule 3   polyethylene glycol-electrolytes (NULYTELY) 420 g solution Take 4,000 mLs by mouth once for 1 dose. 8000 mL 0   simvastatin (ZOCOR) 40 MG tablet Take 1 tablet (40 mg total) by mouth at bedtime. 90 tablet 0   TRULICITY 3 MG/0.5ML SOPN INJECT 3 MG SUBCUTANEOUSLY ONCE A WEEK 4 mL 0   Vitamin D, Ergocalciferol, (DRISDOL) 1.25 MG (50000 UNIT) CAPS capsule Take 1 capsule (50,000 Units total) by mouth once a week. 8 capsule 0   No current facility-administered medications for this visit.    Allergies as of 03/05/2023   (No Known Allergies)    Family History  Problem Relation Age of Onset   Heart disease Mother    Diabetes Mother    Heart disease Father    Hypertension Father    Thyroid disease Father    Hyperlipidemia Father    Breast cancer Paternal Grandmother    Breast cancer Paternal Aunt    Cancer - Colon Paternal Uncle    Colon cancer Neg Hx     Social History   Socioeconomic History   Marital status: Married    Spouse name: Not on file   Number of children: 2   Years of education: Not on file   Highest education level: Not on file  Occupational History   Not on file  Tobacco Use   Smoking status: Former    Packs/day: 0.50    Years: 15.00    Additional pack years: 0.00    Total pack years: 7.50    Types: Cigarettes    Quit date: 11/14/2019    Years since quitting: 3.3   Smokeless tobacco: Never  Vaping Use   Vaping Use: Never used  Substance and Sexual Activity   Alcohol use: No   Drug use: No   Sexual activity: Yes    Birth control/protection: Surgical, Pill  Other Topics Concern   Not on file  Social History Narrative   Lives with her spouse and children.    Social Determinants of Health   Financial Resource Strain:  Low Risk  (03/14/2022)   Overall Financial Resource Strain (CARDIA)    Difficulty of Paying Living Expenses: Not hard at all  Food Insecurity: No Food Insecurity (03/14/2022)   Hunger Vital Sign    Worried About Running Out of Food in the Last Year: Never true    Ran Out of Food in the Last Year: Never true  Transportation Needs: No Transportation Needs (03/14/2022)   PRAPARE - Transportation    Lack of Transportation (Medical): No    Lack of  Transportation (Non-Medical): No  Physical Activity: Insufficiently Active (03/14/2022)   Exercise Vital Sign    Days of Exercise per Week: 5 days    Minutes of Exercise per Session: 20 min  Stress: No Stress Concern Present (03/14/2022)   [employer] of Occupational Health - Occupational Stress Questionnaire    Feeling of Stress : Only a little  Social Connections: Moderately Isolated (03/14/2022)   Social Connection and Isolation Panel [NHANES]    Frequency of Communication with Friends and Family: Twice a week    Frequency of Social Gatherings with Friends and Family: Never    Attends Religious Services: 1 to 4 times per year    Active Member of [employer] or Organizations: No    Attends Banker Meetings: Never    Marital Status: Married  Catering manager Violence: Not At Risk (03/14/2022)   Humiliation, Afraid, Rape, and Kick questionnaire    Fear of Current or Ex-Partner: No    Emotionally Abused: No    Physically Abused: No    Sexually Abused: No    Review of Systems: Gen: Denies any fever, chills, cold or flulike symptoms, presyncope, syncope. CV: Denies chest pain, heart palpitations. Resp: Denies shortness of breath, cough. GI: See HPI GU : Denies urinary burning, urinary frequency, urinary hesitancy MS: Denies joint pain. Derm: Denies rash. Psych: Denies depression, anxiety. Heme: See HPI  Physical Exam: BP 122/87 (BP Location: Right Arm, Patient Position: Sitting, Cuff Size: Large)   Pulse 83   Temp 98.3 F (36.8 C)  (Oral)   Ht 5\' 6"  (1.676 m)   Wt 256 lb 12.8 oz (116.5 kg)   LMP  (LMP Unknown)   SpO2 97%   BMI 41.45 kg/m  General:   Alert and oriented. Pleasant and cooperative. Well-nourished and well-developed.  Head:  Normocephalic and atraumatic. Eyes:  Without icterus, sclera clear and conjunctiva pink.  Ears:  Normal auditory acuity. Lungs:  Clear to auscultation bilaterally. No wheezes, rales, or rhonchi. No distress.  Heart:  S1, S2 present without murmurs appreciated.  Abdomen:  +BS, soft, non-tender and non-distended. No HSM noted. No guarding or rebound. No masses appreciated.  Rectal:  Deferred  Msk:  Symmetrical without gross deformities. Normal posture. Extremities:  Without edema. Neurologic:  Alert and  oriented x4;  grossly normal neurologically. Skin:  Intact without significant lesions or rashes. Psych:  Normal mood and affect.    Assessment:  42 year old female with history of anxiety, depression, arthritis, HLD, diabetes, GERD, presenting today at the request of Dr. Durwin Nora for IDA.  Also discussed change in bowel habits, nausea, unintentional weight loss, GERD, dysphagia.  IDA:  Hemoglobin previously in the 12 range, down to 9.8 on 02/18/2023, 9.5 on 02/26/2023 with microcytic indices.  Found to have iron deficiency on 5/16 with iron 36, iron saturation 6%, ferritin 4.  She was started on oral iron at that time.  She denies overt GI bleeding, vaginal bleeding, hematuria.  She does report history of menorrhagia, but has not had any vaginal bleeding in years as she is on medications to prevent this.  She does take meloxicam a couple times a week for hip pain.  No other NSAIDs.  She does have chronic GERD, not adequately managed as discussed below.  Also recently with some change in bowel habits, worsening constipation and passing some clear mucus per rectum, associated intermittent nausea without vomiting, and unintentional weight loss.  Differential is broad.  May have gastritis,  duodenitis, esophagitis, PUD, H. pylori, AVMs,  polyps, and unable to rule out malignancy.  She needs EGD and colonoscopy for further evaluation.  Will also update CBC to ensure hemoglobin has not continued to decline.  Change in bowel habits: Chronic history of constipation with bowel movements about once a week, but now with sensation of needing to have a bowel movement 3-4 times a day, only passing a small amount of clear mucus per rectum.  No watery stools.  Episode of abdominal pain yesterday that improved when she finally had a bowel movement.   Suspect she is dealing with worsening baseline constipation, possibly of idiopathic nature, but unable to rule out malignancy as she is also had new onset IDA.  Symptoms do not seem consistent with infectious etiology or IBD.  I will start her on Linzess and we are also scheduling a colonoscopy in the near future.  Nausea/unintentional weight loss: Intermittent nausea without vomiting causing decreased oral intake and associated weight loss, possibly secondary to change in bowel habits/worsening constipation.  She does have uncontrolled GERD which may also be contributing.  Unable to rule out gastritis, PUD, H. pylori, malignancy as she also has new onset IDA.  I am starting her on Linzess for constipation, omeprazole for GERD, and scheduling EGD and colonoscopy in the near future.  GERD: Chronic.  Not adequately managed on omeprazole 20 mg daily.  Previously on pantoprazole 40 mg twice daily and had breakthrough symptoms.  Overall, feels that omeprazole works better than pantoprazole.  Dysphagia: Infrequent dysphagia to bread.  As we are proceeding with an EGD to evaluate IDA, will add on possible dilation.    Plan:  CBC Proceed with upper endoscopy +/- dilation + colonoscopy with propofol by Dr. Marletta Lor in near future. The risks, benefits, and alternatives have been discussed with the patient in detail. The patient states understanding and desires to  proceed.  ASA 3 UPT 2 days of clear liquids. 1.5 bowel preps. Hold iron x 7 days prior to procedure Hold Trulicity x 7 days prior to procedure Take metformin as prescribed day prior to procedure. No morning diabetes medications day of procedure. Limit meloxicam is much as possible and avoid all other NSAIDs.  Use Tylenol first for pain. Start Linzess 145 mcg daily.  Samples provided.  Requested progress report in 1 week. Start omeprazole 40 mg daily 30 minutes before breakfast. Counseled on GERD diet/lifestyle. Follow-up after procedures.   Ermalinda Memos, PA-C [facility] 03/05/2023

## 2023-03-03 NOTE — H&P (View-Only) (Signed)
 Referring Provider: Dixon, Phillip E, MD Primary Care Physician:  Dixon, Phillip E, MD Primary Gastroenterologist:  Dr. Carver  Chief Complaint  Patient presents with   Anemia    Iron deficiency anemia    HPI:   [name] is a 42 y.o. female presenting today at the request of Dixon, Phillip E, MD for iron deficiency anemia.  Reviewed recent office visit with PCP 02/18/2023.  Patient was presenting with chief complaint of 3-week history of watery diarrhea and RUQ abdominal pain.  It was suspected that the symptoms were related.  Labs and stool studies were ordered.  Stool studies have not been completed.  Reviewed labs.  Hemoglobin previously in the 12 range, down to 9.8 on 02/18/2023, 9.5 on 02/26/2023 with microcytic indices.  Found to have iron deficiency on 5/16 as well with iron 36, iron saturation 6%, ferritin 4.  PCP recommended starting daily iron and she was referred to GI for further evaluation.  Today: Reports some intermittent anemia in the past. No brbpr or melena. No abnormal vaginal bleeding. Doesn't have period due to medications. When she did have them, they were extremely heavy. Hasn't had a period in years. No hematuria.   NSAIDs: Meloxicam for hip pain. Has been on this for a little over a year. Takes twice a week. No other NSAIDs.   Having change in bowel habits over the last 5 weeks. Chronic constipation. BM about once a week if she was lucky. Now, she feels like she has to go to the bathroom and tries 3-4 times a day, but is only passing small amount of clear mucous. No similar symptoms in the past. Yesterday, she had some abdominal pain, generalized, and she finally had a BM. After that, the pain eased up. Hasn't ever tried anything OTC for constipation.   Some nausea but no vomiting. Has been losing weight unintentionally as she isn't eating as much due to nausea. Has history of heartburn. Was on Protonix BID, but this was [facility] and no  longer has this. Now taking OTC Prilosec daily and tums at night. When on Protonix BID, she still had breakthrough.  Feels Prilosec is more helpful than Protonix. Limiting fried/fatty/spicy foods and soda. Very rare dysphagia to something like bread.   Some intermittent fatigue. No SOB or CP.   Past Medical History:  Diagnosis Date   Anxiety    Arthritis    Depression    Diabetes (HCC)    GERD (gastroesophageal reflux disease)    Hypercholesteremia    Sinus infection 03/27/2021    Past Surgical History:  Procedure Laterality Date   BREAST BIOPSY Left 04/03/2021   Procedure: BREAST BIOPSY; excision of duct lesion;  Surgeon: Bridges, Lindsay C, MD;  Location: [facility];  Service: General;  Laterality: Left;   CHOLECYSTECTOMY N/A 07/13/2014   Procedure: LAPAROSCOPIC CHOLECYSTECTOMY;  Surgeon: William S Bradford, MD;  Location: [facility];  Service: General;  Laterality: N/A;   DENTAL SURGERY  2018   plate top & bottom   DILATATION AND CURETTAGE/HYSTEROSCOPY WITH MINERVA N/A 04/16/2022   Procedure: DILATATION AND CURETTAGE/HYSTEROSCOPY WITH MINERVA;  Surgeon: Eure, Luther H, MD;  Location: [facility];  Service: Gynecology;  Laterality: N/A;   TUBAL LIGATION      Current Outpatient Medications  Medication Sig Dispense Refill   citalopram (CELEXA) 40 MG tablet Take 1 tablet by mouth once daily 30 tablet 0   fenofibrate (TRICOR) 145 MG tablet Take 1 tablet by mouth once   daily 30 tablet 0   Ferrous Sulfate (IRON) 325 (65 Fe) MG TABS Take 1 tablet by mouth daily.     fluticasone (FLONASE) 50 MCG/ACT nasal spray Place 2 sprays into both nostrils daily. 16 g 6   ketorolac (TORADOL) 10 MG tablet TAKE 1 TABLET BY MOUTH EVERY 8 HOURS AS NEEDED 15 tablet 0   megestrol (MEGACE) 40 MG tablet Take 1 tablet (40 mg total) by mouth daily. 30 tablet 11   meloxicam (MOBIC) 15 MG tablet TAKE 1 TABLET BY MOUTH ONCE DAILY AS NEEDED FOR PAIN 30 tablet 0   metFORMIN (GLUCOPHAGE-XR) 500 MG 24 hr tablet Take 1 tablet by  mouth once daily with breakfast 30 tablet 0   norethindrone (MICRONOR) 0.35 MG tablet Take 1 tablet by mouth daily 84 tablet 3   omeprazole (PRILOSEC) 40 MG capsule Take 1 capsule (40 mg total) by mouth daily before breakfast. 30 capsule 3   polyethylene glycol-electrolytes (NULYTELY) 420 g solution Take 4,000 mLs by mouth once for 1 dose. 8000 mL 0   simvastatin (ZOCOR) 40 MG tablet Take 1 tablet (40 mg total) by mouth at bedtime. 90 tablet 0   TRULICITY 3 MG/0.5ML SOPN INJECT 3 MG SUBCUTANEOUSLY ONCE A WEEK 4 mL 0   Vitamin D, Ergocalciferol, (DRISDOL) 1.25 MG (50000 UNIT) CAPS capsule Take 1 capsule (50,000 Units total) by mouth once a week. 8 capsule 0   No current facility-administered medications for this visit.    Allergies as of 03/05/2023   (No Known Allergies)    Family History  Problem Relation Age of Onset   Heart disease Mother    Diabetes Mother    Heart disease Father    Hypertension Father    Thyroid disease Father    Hyperlipidemia Father    Breast cancer Paternal Grandmother    Breast cancer Paternal Aunt    Cancer - Colon Paternal Uncle    Colon cancer Neg Hx     Social History   Socioeconomic History   Marital status: Married    Spouse name: Not on file   Number of children: 2   Years of education: Not on file   Highest education level: Not on file  Occupational History   Not on file  Tobacco Use   Smoking status: Former    Packs/day: 0.50    Years: 15.00    Additional pack years: 0.00    Total pack years: 7.50    Types: Cigarettes    Quit date: 11/14/2019    Years since quitting: 3.3   Smokeless tobacco: Never  Vaping Use   Vaping Use: Never used  Substance and Sexual Activity   Alcohol use: No   Drug use: No   Sexual activity: Yes    Birth control/protection: Surgical, Pill  Other Topics Concern   Not on file  Social History Narrative   Lives with her spouse and children.    Social Determinants of Health   Financial Resource Strain:  Low Risk  (03/14/2022)   Overall Financial Resource Strain (CARDIA)    Difficulty of Paying Living Expenses: Not hard at all  Food Insecurity: No Food Insecurity (03/14/2022)   Hunger Vital Sign    Worried About Running Out of Food in the Last Year: Never true    Ran Out of Food in the Last Year: Never true  Transportation Needs: No Transportation Needs (03/14/2022)   PRAPARE - Transportation    Lack of Transportation (Medical): No    Lack of   Transportation (Non-Medical): No  Physical Activity: Insufficiently Active (03/14/2022)   Exercise Vital Sign    Days of Exercise per Week: 5 days    Minutes of Exercise per Session: 20 min  Stress: No Stress Concern Present (03/14/2022)   [employer] - Occupational Stress Questionnaire    Feeling of Stress : Only a little  Social Connections: Moderately Isolated (03/14/2022)   Social Connection and Isolation Panel [NHANES]    Frequency of Communication with Friends and Family: Twice a week    Frequency of Social Gatherings with Friends and Family: Never    Attends Religious Services: 1 to 4 times per year    Active Member of Clubs or Organizations: No    Attends Club or Organization Meetings: Never    Marital Status: Married  Intimate Partner Violence: Not At Risk (03/14/2022)   Humiliation, Afraid, Rape, and Kick questionnaire    Fear of Current or Ex-Partner: No    Emotionally Abused: No    Physically Abused: No    Sexually Abused: No    Review of Systems: Gen: Denies any fever, chills, cold or flulike symptoms, presyncope, syncope. CV: Denies chest pain, heart palpitations. Resp: Denies shortness of breath, cough. GI: See HPI GU : Denies urinary burning, urinary frequency, urinary hesitancy MS: Denies joint pain. Derm: Denies rash. Psych: Denies depression, anxiety. Heme: See HPI  Physical Exam: BP 122/87 (BP Location: Right Arm, Patient Position: Sitting, Cuff Size: Large)   Pulse 83   Temp 98.3 F (36.8 C)  (Oral)   Ht 5' 6" (1.676 m)   Wt 256 lb 12.8 oz (116.5 kg)   LMP  (LMP Unknown)   SpO2 97%   BMI 41.45 kg/m  General:   Alert and oriented. Pleasant and cooperative. Well-nourished and well-developed.  Head:  Normocephalic and atraumatic. Eyes:  Without icterus, sclera clear and conjunctiva pink.  Ears:  Normal auditory acuity. Lungs:  Clear to auscultation bilaterally. No wheezes, rales, or rhonchi. No distress.  Heart:  S1, S2 present without murmurs appreciated.  Abdomen:  +BS, soft, non-tender and non-distended. No HSM noted. No guarding or rebound. No masses appreciated.  Rectal:  Deferred  Msk:  Symmetrical without gross deformities. Normal posture. Extremities:  Without edema. Neurologic:  Alert and  oriented x4;  grossly normal neurologically. Skin:  Intact without significant lesions or rashes. Psych:  Normal mood and affect.    Assessment:  41-year-old female with history of anxiety, depression, arthritis, HLD, diabetes, GERD, presenting today at the request of Dr. Dixon for IDA.  Also discussed change in bowel habits, nausea, unintentional weight loss, GERD, dysphagia.  IDA:  Hemoglobin previously in the 12 range, down to 9.8 on 02/18/2023, 9.5 on 02/26/2023 with microcytic indices.  Found to have iron deficiency on 5/16 with iron 36, iron saturation 6%, ferritin 4.  She was started on oral iron at that time.  She denies overt GI bleeding, vaginal bleeding, hematuria.  She does report history of menorrhagia, but has not had any vaginal bleeding in years as she is on medications to prevent this.  She does take meloxicam a couple times a week for hip pain.  No other NSAIDs.  She does have chronic GERD, not adequately managed as discussed below.  Also recently with some change in bowel habits, worsening constipation and passing some clear mucus per rectum, associated intermittent nausea without vomiting, and unintentional weight loss.  Differential is broad.  May have gastritis,  duodenitis, esophagitis, PUD, H. pylori, AVMs,   polyps, and unable to rule out malignancy.  She needs EGD and colonoscopy for further evaluation.  Will also update CBC to ensure hemoglobin has not continued to decline.  Change in bowel habits: Chronic history of constipation with bowel movements about once a week, but now with sensation of needing to have a bowel movement 3-4 times a day, only passing a small amount of clear mucus per rectum.  No watery stools.  Episode of abdominal pain yesterday that improved when she finally had a bowel movement.   Suspect she is dealing with worsening baseline constipation, possibly of idiopathic nature, but unable to rule out malignancy as she is also had new onset IDA.  Symptoms do not seem consistent with infectious etiology or IBD.  I will start her on Linzess and we are also scheduling a colonoscopy in the near future.  Nausea/unintentional weight loss: Intermittent nausea without vomiting causing decreased oral intake and associated weight loss, possibly secondary to change in bowel habits/worsening constipation.  She does have uncontrolled GERD which may also be contributing.  Unable to rule out gastritis, PUD, H. pylori, malignancy as she also has new onset IDA.  I am starting her on Linzess for constipation, omeprazole for GERD, and scheduling EGD and colonoscopy in the near future.  GERD: Chronic.  Not adequately managed on omeprazole 20 mg daily.  Previously on pantoprazole 40 mg twice daily and had breakthrough symptoms.  Overall, feels that omeprazole works better than pantoprazole.  Dysphagia: Infrequent dysphagia to bread.  As we are proceeding with an EGD to evaluate IDA, will add on possible dilation.    Plan:  CBC Proceed with upper endoscopy +/- dilation + colonoscopy with propofol by Dr. Carver in near future. The risks, benefits, and alternatives have been discussed with the patient in detail. The patient states understanding and desires to  proceed.  ASA 3 UPT 2 days of clear liquids. 1.5 bowel preps. Hold iron x 7 days prior to procedure Hold Trulicity x 7 days prior to procedure Take metformin as prescribed day prior to procedure. No morning diabetes medications day of procedure. Limit meloxicam is much as possible and avoid all other NSAIDs.  Use Tylenol first for pain. Start Linzess 145 mcg daily.  Samples provided.  Requested progress report in 1 week. Start omeprazole 40 mg daily 30 minutes before breakfast. Counseled on GERD diet/lifestyle. Follow-up after procedures.   Tauheed Mcfayden, PA-C [facility] 03/05/2023   

## 2023-03-04 ENCOUNTER — Ambulatory Visit: Payer: 59 | Admitting: Internal Medicine

## 2023-03-04 ENCOUNTER — Encounter: Payer: Self-pay | Admitting: Internal Medicine

## 2023-03-04 VITALS — BP 123/75 | HR 91 | Ht 66.0 in | Wt 256.0 lb

## 2023-03-04 DIAGNOSIS — D509 Iron deficiency anemia, unspecified: Secondary | ICD-10-CM | POA: Insufficient documentation

## 2023-03-04 DIAGNOSIS — R197 Diarrhea, unspecified: Secondary | ICD-10-CM | POA: Diagnosis not present

## 2023-03-04 NOTE — Progress Notes (Signed)
Established Patient Office Visit  Subjective   Patient ID: [name], female    DOB: 27-Jun-1981  Age: 42 y.o. MRN: [id number]  Chief Complaint  Patient presents with   Diarrhea    Follow up    [name] returns here today for 2-week follow-up in the setting of recent diarrhea.  She was last evaluated by me on 5/8 at which time she endorsed a 3-week history of watery diarrhea and dull RUQ abdominal pain.  Labs and stool studies were ordered.  2-week follow-up was arranged for reassessment.  Labs from 5/8 were significant for microcytic anemia.  Repeat CBC and iron studies 1 week later were consistent with iron deficiency anemia.  Oral iron supplementation was recommended and she was referred to gastroenterology for evaluation.  There have otherwise been no acute interval events. Today [name] states that her diarrhea is unchanged.  She continues to have 5-6 episodes of watery, mucus-like diarrhea each day.  She is not having formed stools intermittently.  She denies abdominal discomfort currently.  She did not complete previously ordered stool studies as she states that each episode of diarrhea does not produce an adequate amount to sample.  She has no additional concerns to discuss today.  Past Medical History:  Diagnosis Date   Anxiety    Arthritis    Depression    Hypercholesteremia    Sinus infection 03/27/2021   Past Surgical History:  Procedure Laterality Date   BREAST BIOPSY Left 04/03/2021   Procedure: BREAST BIOPSY; excision of duct lesion;  Surgeon: Lucretia Roers, MD;  Location: [facility];  Service: General;  Laterality: Left;   CHOLECYSTECTOMY N/A 07/13/2014   Procedure: LAPAROSCOPIC CHOLECYSTECTOMY;  Surgeon: Marlane Hatcher, MD;  Location: [facility];  Service: General;  Laterality: N/A;   DENTAL SURGERY  2018   plate top & bottom   DILATATION AND CURETTAGE/HYSTEROSCOPY WITH MINERVA N/A 04/16/2022   Procedure: DILATATION AND CURETTAGE/HYSTEROSCOPY WITH MINERVA;  Surgeon:  Lazaro Arms, MD;  Location: [facility];  Service: Gynecology;  Laterality: N/A;   TUBAL LIGATION     Social History   Tobacco Use   Smoking status: Former    Packs/day: 0.50    Years: 15.00    Additional pack years: 0.00    Total pack years: 7.50    Types: Cigarettes    Quit date: 11/14/2019    Years since quitting: 3.3   Smokeless tobacco: Never  Vaping Use   Vaping Use: Never used  Substance Use Topics   Alcohol use: No   Drug use: No   Family History  Problem Relation Age of Onset   Heart disease Mother    Diabetes Mother    Heart disease Father    Hypertension Father    Thyroid disease Father    Hyperlipidemia Father    Breast cancer Paternal Aunt    Cancer - Colon Paternal Uncle    Breast cancer Paternal Grandmother    No Known Allergies  Review of Systems  Constitutional:  Negative for chills, fever, malaise/fatigue and weight loss.       Pica  Gastrointestinal:  Positive for diarrhea. Negative for blood in stool.  All other systems reviewed and are negative.     Objective:     BP 123/75   Pulse 91   Ht 5\' 6"  (1.676 m)   Wt 256 lb (116.1 kg)   SpO2 97%   BMI 41.32 kg/m  BP Readings from Last 3 Encounters:  03/04/23 123/75  02/18/23 120/76  01/08/23 136/77      Physical Exam Vitals reviewed.  Constitutional:      General: She is not in acute distress.    Appearance: Normal appearance. She is obese. She is not toxic-appearing.  HENT:     Head: Normocephalic and atraumatic.     Right Ear: External ear normal.     Left Ear: External ear normal.     Nose: Nose normal. No congestion or rhinorrhea.     Mouth/Throat:     Mouth: Mucous membranes are moist.     Pharynx: Oropharynx is clear. No oropharyngeal exudate or posterior oropharyngeal erythema.  Eyes:     General: No scleral icterus.    Extraocular Movements: Extraocular movements intact.     Conjunctiva/sclera: Conjunctivae normal.     Pupils: Pupils are equal, round, and reactive to  light.  Cardiovascular:     Rate and Rhythm: Normal rate and regular rhythm.     Pulses: Normal pulses.     Heart sounds: Normal heart sounds. No murmur heard.    No friction rub. No gallop.  Pulmonary:     Effort: Pulmonary effort is normal.     Breath sounds: Normal breath sounds. No wheezing, rhonchi or rales.  Abdominal:     General: Abdomen is flat. Bowel sounds are normal. There is no distension.     Palpations: Abdomen is soft.     Tenderness: There is no abdominal tenderness.  Musculoskeletal:        General: No swelling. Normal range of motion.     Cervical back: Normal range of motion.     Right lower leg: No edema.     Left lower leg: No edema.  Lymphadenopathy:     Cervical: No cervical adenopathy.  Skin:    General: Skin is warm and dry.     Capillary Refill: Capillary refill takes less than 2 seconds.     Coloration: Skin is not jaundiced.  Neurological:     General: No focal deficit present.     Mental Status: She is alert and oriented to person, place, and time.  Psychiatric:        Mood and Affect: Mood normal.        Behavior: Behavior normal.   Last CBC Lab Results  Component Value Date   WBC 3.9 02/26/2023   HGB 9.5 (L) 02/26/2023   HCT 31.3 (L) 02/26/2023   MCV 78 (L) 02/26/2023   MCH 23.5 (L) 02/26/2023   RDW 15.5 (H) 02/26/2023   PLT 348 02/26/2023   Last metabolic panel Lab Results  Component Value Date   GLUCOSE 76 02/18/2023   NA 140 02/18/2023   K 4.5 02/18/2023   CL 106 02/18/2023   CO2 20 02/18/2023   BUN 13 02/18/2023   CREATININE 1.09 (H) 02/18/2023   EGFR 65 02/18/2023   CALCIUM 9.7 02/18/2023   PROT 7.3 02/18/2023   ALBUMIN 4.7 02/18/2023   LABGLOB 2.6 02/18/2023   AGRATIO 1.8 02/18/2023   BILITOT 0.2 02/18/2023   ALKPHOS 39 (L) 02/18/2023   AST 25 02/18/2023   ALT 28 02/18/2023   ANIONGAP 7 04/14/2022   Last lipids Lab Results  Component Value Date   CHOL 130 07/18/2022   HDL 29 (L) 07/18/2022   LDLCALC 77  07/18/2022   TRIG 134 07/18/2022   CHOLHDL 4.5 (H) 07/18/2022   Last hemoglobin A1c Lab Results  Component Value Date   HGBA1C 5.7 10/20/2022  Last thyroid functions Lab Results  Component Value Date   TSH 1.230 02/10/2022   Last vitamin D Lab Results  Component Value Date   VD25OH 20.3 (L) 10/20/2022   The 10-year ASCVD risk score (Arnett DK, et al., 2019) is: 1.6%    Assessment & Plan:   Problem List Items Addressed This Visit       Diarrhea - Primary    Returning to care today for follow-up of diarrhea.  Her symptoms are largely unchanged currently.  She continues to have 5-6 daily episodes of watery, mucus-like diarrhea.  She has not appreciated any blood in her stool.  Currently denies abdominal discomfort.  She did not complete previously ordered stool studies, however labs were concerning for iron deficiency anemia. -I recommended that she complete previously ordered stool studies -Previously referred to gastroenterology in the setting of iron deficiency anemia and persistent diarrhea.  Her appointment is scheduled for tomorrow (5/23). -Continue aggressive hydration      Iron deficiency anemia    Recent labs consistent with iron deficiency anemia.  She endorses a prior history of IDA and states that she was on oral iron supplementation years ago.  She has not appreciated any blood in her stool recently.  She states that she purchased oral iron supplementation yesterday and started taking it today. -Continue oral iron supplementation -Gastroenterology appointment scheduled for tomorrow (5/23) -Repeat iron studies at follow-up in July       Return if symptoms worsen or fail to improve.    Billie Lade, MD

## 2023-03-04 NOTE — Assessment & Plan Note (Signed)
Returning to care today for follow-up of diarrhea.  Her symptoms are largely unchanged currently.  She continues to have 5-6 daily episodes of watery, mucus-like diarrhea.  She has not appreciated any blood in her stool.  Currently denies abdominal discomfort.  She did not complete previously ordered stool studies, however labs were concerning for iron deficiency anemia. -I recommended that she complete previously ordered stool studies -Previously referred to gastroenterology in the setting of iron deficiency anemia and persistent diarrhea.  Her appointment is scheduled for tomorrow (5/23). -Continue aggressive hydration

## 2023-03-04 NOTE — Patient Instructions (Signed)
It was a pleasure to see you today.  Thank you for giving [place] the opportunity to be involved in your care.  Below is a brief recap of your visit and next steps.  We will plan to see you again in July.  Summary Continue oral iron supplementation Follow up in July as previously scheduled GI appointment tomorrow

## 2023-03-04 NOTE — Assessment & Plan Note (Signed)
Recent labs consistent with iron deficiency anemia.  She endorses a prior history of IDA and states that she was on oral iron supplementation years ago.  She has not appreciated any blood in her stool recently.  She states that she purchased oral iron supplementation yesterday and started taking it today. -Continue oral iron supplementation -Gastroenterology appointment scheduled for tomorrow (5/23) -Repeat iron studies at follow-up in July

## 2023-03-05 ENCOUNTER — Encounter: Payer: Self-pay | Admitting: *Deleted

## 2023-03-05 ENCOUNTER — Encounter: Payer: Self-pay | Admitting: Gastroenterology

## 2023-03-05 ENCOUNTER — Ambulatory Visit: Payer: 59 | Admitting: Gastroenterology

## 2023-03-05 ENCOUNTER — Other Ambulatory Visit: Payer: Self-pay | Admitting: *Deleted

## 2023-03-05 VITALS — BP 122/87 | HR 83 | Temp 98.3°F | Ht 66.0 in | Wt 256.8 lb

## 2023-03-05 DIAGNOSIS — D509 Iron deficiency anemia, unspecified: Secondary | ICD-10-CM | POA: Diagnosis not present

## 2023-03-05 DIAGNOSIS — R1310 Dysphagia, unspecified: Secondary | ICD-10-CM | POA: Diagnosis not present

## 2023-03-05 DIAGNOSIS — R110 Nausea: Secondary | ICD-10-CM

## 2023-03-05 DIAGNOSIS — R634 Abnormal weight loss: Secondary | ICD-10-CM | POA: Insufficient documentation

## 2023-03-05 DIAGNOSIS — R194 Change in bowel habit: Secondary | ICD-10-CM

## 2023-03-05 DIAGNOSIS — K219 Gastro-esophageal reflux disease without esophagitis: Secondary | ICD-10-CM | POA: Diagnosis not present

## 2023-03-05 DIAGNOSIS — K5900 Constipation, unspecified: Secondary | ICD-10-CM | POA: Diagnosis not present

## 2023-03-05 MED ORDER — PEG 3350-KCL-NA BICARB-NACL 420 G PO SOLR: 4000.0000 mL | Freq: Once | ORAL | 0 refills | Status: AC

## 2023-03-05 MED ORDER — OMEPRAZOLE 40 MG PO CPDR
40.0000 mg | DELAYED_RELEASE_CAPSULE | Freq: Every day | ORAL | 3 refills | Status: DC
Start: 2023-03-05 — End: 2023-04-02

## 2023-03-05 NOTE — Patient Instructions (Addendum)
Please have blood work completed next week at [facility].  Iron deficiency anemia: We will arrange for you to have an upper endoscopy with possible stretching of your esophagus and colonoscopy in the near future with Dr. Marletta Lor at [facility]. You will need to hold iron for 7 days prior to your procedure. You will need to hold Trulicity for 7 days prior to procedure. 1 day prior to your procedure: Take metformin as prescribed. Day of procedure: Do not take any morning diabetes medications.  Limit meloxicam is much as possible and use Tylenol if needed.  Meloxicam and other NSAID products can cause inflammation within your GI tract.  Change in bowel habits: I suspect you are dealing with worsening constipation.  We will try you on Linzess 145 mcg daily.  This medication is taken best first thing in the morning, 30 minutes before you eat anything.  It can cause diarrhea for 1-2 weeks, but should taper off.  We are providing you with samples today.  Please call next week with a progress report.  GERD: Start omeprazole 40 mg daily 30 minutes before breakfast. Follow a GERD diet:  Avoid fried, fatty, greasy, spicy, citrus foods. Avoid caffeine and carbonated beverages. Avoid chocolate. Try eating 4-6 small meals a day rather than 3 large meals. Do not eat within 3 hours of laying down. Prop head of bed up on wood or bricks to create a 6 inch incline.  We will follow-up with you in the office after your procedures.  Do not hesitate to call sooner if you have questions or concerns.  Ermalinda Memos, PA-C [facility]

## 2023-03-12 ENCOUNTER — Telehealth: Payer: Self-pay | Admitting: *Deleted

## 2023-03-12 ENCOUNTER — Other Ambulatory Visit: Payer: Self-pay | Admitting: Gastroenterology

## 2023-03-12 DIAGNOSIS — D509 Iron deficiency anemia, unspecified: Secondary | ICD-10-CM | POA: Diagnosis not present

## 2023-03-12 DIAGNOSIS — K5904 Chronic idiopathic constipation: Secondary | ICD-10-CM

## 2023-03-12 LAB — CBC WITH DIFFERENTIAL/PLATELET
Absolute Monocytes: 331 cells/uL (ref 200–950)
Basophils Absolute: 41 cells/uL (ref 0–200)
Basophils Relative: 0.9 %
Eosinophils Absolute: 120 cells/uL (ref 15–500)
Eosinophils Relative: 2.6 %
HCT: 31.4 % — ABNORMAL LOW (ref 35.0–45.0)
Hemoglobin: 9.8 g/dL — ABNORMAL LOW (ref 11.7–15.5)
Lymphs Abs: 1371 cells/uL (ref 850–3900)
MCH: 24.5 pg — ABNORMAL LOW (ref 27.0–33.0)
MCHC: 31.2 g/dL — ABNORMAL LOW (ref 32.0–36.0)
MCV: 78.5 fL — ABNORMAL LOW (ref 80.0–100.0)
MPV: 10.7 fL (ref 7.5–12.5)
Monocytes Relative: 7.2 %
Neutro Abs: 2737 cells/uL (ref 1500–7800)
Neutrophils Relative %: 59.5 %
Platelets: 352 10*3/uL (ref 140–400)
RBC: 4 10*6/uL (ref 3.80–5.10)
RDW: 16.8 % — ABNORMAL HIGH (ref 11.0–15.0)
Total Lymphocyte: 29.8 %
WBC: 4.6 10*3/uL (ref 3.8–10.8)

## 2023-03-12 MED ORDER — LINACLOTIDE 145 MCG PO CAPS
145.0000 ug | ORAL_CAPSULE | Freq: Every day | ORAL | 5 refills | Status: DC
Start: 2023-03-12 — End: 2023-03-13

## 2023-03-12 NOTE — Telephone Encounter (Signed)
Rx sent 

## 2023-03-12 NOTE — Telephone Encounter (Signed)
Pt called and states Linzess is working well for her and she would like a prescriptions sent to [facility] in [place].

## 2023-03-13 ENCOUNTER — Telehealth: Payer: Self-pay

## 2023-03-13 ENCOUNTER — Encounter: Payer: Self-pay | Admitting: *Deleted

## 2023-03-13 ENCOUNTER — Other Ambulatory Visit: Payer: Self-pay | Admitting: Gastroenterology

## 2023-03-13 DIAGNOSIS — K5904 Chronic idiopathic constipation: Secondary | ICD-10-CM

## 2023-03-13 MED ORDER — LUBIPROSTONE 8 MCG PO CAPS
8.0000 ug | ORAL_CAPSULE | Freq: Two times a day (BID) | ORAL | 3 refills | Status: DC
Start: 2023-03-13 — End: 2023-06-24

## 2023-03-13 NOTE — Telephone Encounter (Signed)
Sent pt a MyChart message

## 2023-03-13 NOTE — Telephone Encounter (Signed)
Patient's insurance does not cover Linzess, patient will need to try either lubiprostone or lactulose.

## 2023-03-13 NOTE — Telephone Encounter (Addendum)
Please let patient know that I will send in lubiprostone (Amitiza) 8 mcg as her insurance does not cover Linzess.  She needs to take this medication twice daily with a meal.

## 2023-03-15 ENCOUNTER — Other Ambulatory Visit: Payer: Self-pay | Admitting: Internal Medicine

## 2023-03-15 DIAGNOSIS — E559 Vitamin D deficiency, unspecified: Secondary | ICD-10-CM

## 2023-03-15 DIAGNOSIS — F419 Anxiety disorder, unspecified: Secondary | ICD-10-CM

## 2023-03-16 ENCOUNTER — Other Ambulatory Visit: Payer: Self-pay | Admitting: Internal Medicine

## 2023-03-17 ENCOUNTER — Ambulatory Visit: Payer: 59 | Admitting: Internal Medicine

## 2023-03-18 ENCOUNTER — Telehealth: Payer: Self-pay | Admitting: *Deleted

## 2023-03-18 NOTE — Telephone Encounter (Signed)
Received approval for lubiprostone. Sent copy to scan center.

## 2023-03-19 ENCOUNTER — Encounter: Payer: Self-pay | Admitting: *Deleted

## 2023-03-20 ENCOUNTER — Other Ambulatory Visit: Payer: Self-pay | Admitting: Internal Medicine

## 2023-03-30 NOTE — Patient Instructions (Signed)
[name]  03/30/2023     @PREFPERIOPPHARMACY @   Your procedure is scheduled on  04/02/2023.   Report to [facility] at  [phone] A.M.   Call this number if you have problems the morning of surgery:  [phone]  If you experience any cold or flu symptoms such as cough, fever, chills, shortness of breath, etc. between now and your scheduled surgery, please notify us at the above number.   Remember:  Follow the diet and prep instructions given to you by the office.     Your last dose of trulicity should have been 03/25/2023 or before.      DO NOT take any medications for diabetes the morning of your procedure.     Take these medicines the morning of surgery with A SIP OF WATER              citalopram, ketoralac(if needed),omeprazole.     Do not wear jewelry, make-up or nail polish, including gel polish,  artificial nails, or any other type of covering on natural nails (fingers and  toes).  Do not wear lotions, powders, or perfumes, or deodorant.  Do not shave 48 hours prior to surgery.  Men may shave face and neck.  Do not bring valuables to the hospital.  [facility] is not responsible for any belongings or valuables.  Contacts, dentures or bridgework may not be worn into surgery.  Leave your suitcase in the car.  After surgery it may be brought to your room.  For patients admitted to the hospital, discharge time will be determined by your treatment team.  Patients discharged the day of surgery will not be allowed to drive home and must have someone with them for 24 hours.    Special instructions:   DO NOT smoke tobacco or vape for 24 hours before your procedure.  Please read over the following fact sheets that you were given. Anesthesia Post-op Instructions and Care and Recovery After Surgery          Upper Endoscopy, Adult, Care After After the procedure, it is common to have a sore throat. It is also common to have: Mild stomach pain or  discomfort. Bloating. Nausea. Follow these instructions at home: The instructions below may help you care for yourself at home. Your health care provider may give you more instructions. If you have questions, ask your health care provider. If you were given a sedative during the procedure, it can affect you for several hours. Do not drive or operate machinery until your health care provider says that it is safe. If you will be going home right after the procedure, plan to have a responsible adult: Take you home from the hospital or clinic. You will not be allowed to drive. Care for you for the time you are told. Follow instructions from your health care provider about what you may eat and drink. Return to your normal activities as told by your health care provider. Ask your health care provider what activities are safe for you. Take over-the-counter and prescription medicines only as told by your health care provider. Contact a health care provider if you: Have a sore throat that lasts longer than one day. Have trouble swallowing. Have a fever. Get help right away if you: Vomit blood or your vomit looks like coffee grounds. Have bloody, black, or tarry stools. Have a very bad sore throat or you cannot swallow. Have difficulty breathing or very bad pain  in your chest or abdomen. These symptoms may be an emergency. Get help right away. Call 911. Do not wait to see if the symptoms will go away. Do not drive yourself to the hospital. Summary After the procedure, it is common to have a sore throat, mild stomach discomfort, bloating, and nausea. If you were given a sedative during the procedure, it can affect you for several hours. Do not drive until your health care provider says that it is safe. Follow instructions from your health care provider about what you may eat and drink. Return to your normal activities as told by your health care provider. This information is not intended to replace  advice given to you by your health care provider. Make sure you discuss any questions you have with your health care provider. Document Revised: 01/08/2022 Document Reviewed: 01/08/2022 Elsevier Patient Education  2024 [employer] Esophageal Dilatation Esophageal dilatation, also called esophageal dilation, is a procedure to widen or open a blocked or narrowed part of the esophagus. The esophagus is the part of the body that moves food and liquid from the mouth to the stomach. You may need this procedure if: You have a buildup of scar tissue in your esophagus that makes it difficult, painful, or impossible to swallow. This can be caused by gastroesophageal reflux disease (GERD). You have cancer of the esophagus. There is a problem with how food moves through your esophagus. In some cases, you may need this procedure repeated at a later time to dilate the esophagus gradually. Tell a health care provider about: Any allergies you have. All medicines you are taking, including vitamins, herbs, eye drops, creams, and over-the-counter medicines. Any problems you or family members have had with anesthetic medicines. Any blood disorders you have. Any surgeries you have had. Any medical conditions you have. Any antibiotic medicines you are required to take before dental procedures. Whether you are pregnant or may be pregnant. What are the risks? Generally, this is a safe procedure. However, problems may occur, including: Bleeding due to a tear in the lining of the esophagus. A hole, or perforation, in the esophagus. What happens before the procedure? Ask your health care provider about: Changing or stopping your regular medicines. This is especially important if you are taking diabetes medicines or blood thinners. Taking medicines such as aspirin and ibuprofen. These medicines can thin your blood. Do not take these medicines unless your health care provider tells you to take them. Taking  over-the-counter medicines, vitamins, herbs, and supplements. Follow instructions from your health care provider about eating or drinking restrictions. Plan to have a responsible adult take you home from the hospital or clinic. Plan to have a responsible adult care for you for the time you are told after you leave the hospital or clinic. This is important. What happens during the procedure? You may be given a medicine to help you relax (sedative). A numbing medicine may be sprayed into the back of your throat, or you may gargle the medicine. Your health care provider may perform the dilatation using various surgical instruments, such as: Simple dilators. This instrument is carefully placed in the esophagus to stretch it. Guided wire bougies. This involves using an endoscope to insert a wire into the esophagus. A dilator is passed over this wire to enlarge the esophagus. Then the wire is removed. Balloon dilators. An endoscope with a small balloon is inserted into the esophagus. The balloon is inflated to stretch the esophagus and open it up. The procedure  may vary among health care providers and hospitals. What can I expect after the procedure? Your blood pressure, heart rate, breathing rate, and blood oxygen level will be monitored until you leave the hospital or clinic. Your throat may feel slightly sore and numb. This will get better over time. You will not be allowed to eat or drink until your throat is no longer numb. When you are able to drink, urinate, and sit on the edge of the bed without nausea or dizziness, you may be able to return home. Follow these instructions at home: Take over-the-counter and prescription medicines only as told by your health care provider. If you were given a sedative during the procedure, it can affect you for several hours. Do not drive or operate machinery until your health care provider says that it is safe. Plan to have a responsible adult care for you for  the time you are told. This is important. Follow instructions from your health care provider about any eating or drinking restrictions. Do not use any products that contain nicotine or tobacco, such as cigarettes, e-cigarettes, and chewing tobacco. If you need help quitting, ask your health care provider. Keep all follow-up visits. This is important. Contact a health care provider if: You have a fever. You have pain that is not relieved by medicine. Get help right away if: You have chest pain. You have trouble breathing. You have trouble swallowing. You vomit blood. You have black, tarry, or bloody stools. These symptoms may represent a serious problem that is an emergency. Do not wait to see if the symptoms will go away. Get medical help right away. Call your local emergency services (911 in the [place] Do not drive yourself to the hospital. Summary Esophageal dilatation, also called esophageal dilation, is a procedure to widen or open a blocked or narrowed part of the esophagus. Plan to have a responsible adult take you home from the hospital or clinic. For this procedure, a numbing medicine may be sprayed into the back of your throat, or you may gargle the medicine. Do not drive or operate machinery until your health care provider says that it is safe. This information is not intended to replace advice given to you by your health care provider. Make sure you discuss any questions you have with your health care provider. Document Revised: 02/15/2020 Document Reviewed: 02/15/2020 Elsevier Patient Education  2024 [employer] Colonoscopy, Adult, Care After The following information offers guidance on how to care for yourself after your procedure. Your health care provider may also give you more specific instructions. If you have problems or questions, contact your health care provider. What can I expect after the procedure? After the procedure, it is common to have: A small amount of blood  in your stool for 24 hours after the procedure. Some gas. Mild cramping or bloating of your abdomen. Follow these instructions at home: Eating and drinking  Drink enough fluid to keep your urine pale yellow. Follow instructions from your health care provider about eating or drinking restrictions. Resume your normal diet as told by your health care provider. Avoid heavy or fried foods that are hard to digest. Activity Rest as told by your health care provider. Avoid sitting for a long time without moving. Get up to take short walks every 1-2 hours. This is important to improve blood flow and breathing. Ask for help if you feel weak or unsteady. Return to your normal activities as told by your health care provider. Ask your  health care provider what activities are safe for you. Managing cramping and bloating  Try walking around when you have cramps or feel bloated. If directed, apply heat to your abdomen as told by your health care provider. Use the heat source that your health care provider recommends, such as a moist heat pack or a heating pad. Place a towel between your skin and the heat source. Leave the heat on for 20-30 minutes. Remove the heat if your skin turns bright red. This is especially important if you are unable to feel pain, heat, or cold. You have a greater risk of getting burned. General instructions If you were given a sedative during the procedure, it can affect you for several hours. Do not drive or operate machinery until your health care provider says that it is safe. For the first 24 hours after the procedure: Do not sign important documents. Do not drink alcohol. Do your regular daily activities at a slower pace than normal. Eat soft foods that are easy to digest. Take over-the-counter and prescription medicines only as told by your health care provider. Keep all follow-up visits. This is important. Contact a health care provider if: You have blood in your stool  2-3 days after the procedure. Get help right away if: You have more than a small spotting of blood in your stool. You have large blood clots in your stool. You have swelling of your abdomen. You have nausea or vomiting. You have a fever. You have increasing pain in your abdomen that is not relieved with medicine. These symptoms may be an emergency. Get help right away. Call 911. Do not wait to see if the symptoms will go away. Do not drive yourself to the hospital. Summary After the procedure, it is common to have a small amount of blood in your stool. You may also have mild cramping and bloating of your abdomen. If you were given a sedative during the procedure, it can affect you for several hours. Do not drive or operate machinery until your health care provider says that it is safe. Get help right away if you have a lot of blood in your stool, nausea or vomiting, a fever, or increased pain in your abdomen. This information is not intended to replace advice given to you by your health care provider. Make sure you discuss any questions you have with your health care provider. Document Revised: 11/11/2022 Document Reviewed: 05/22/2021 Elsevier Patient Education  2024 [employer]. Monitored Anesthesia Care, Care After The following information offers guidance on how to care for yourself after your procedure. Your health care provider may also give you more specific instructions. If you have problems or questions, contact your health care provider. What can I expect after the procedure? After the procedure, it is common to have: Tiredness. Little or no memory about what happened during or after the procedure. Impaired judgment when it comes to making decisions. Nausea or vomiting. Some trouble with balance. Follow these instructions at home: For the time period you were told by your health care provider:  Rest. Do not participate in activities where you could fall or become  injured. Do not drive or use machinery. Do not drink alcohol. Do not take sleeping pills or medicines that cause drowsiness. Do not make important decisions or sign legal documents. Do not take care of children on your own. Medicines Take over-the-counter and prescription medicines only as told by your health care provider. If you were prescribed antibiotics, take them as  told by your health care provider. Do not stop using the antibiotic even if you start to feel better. Eating and drinking Follow instructions from your health care provider about what you may eat and drink. Drink enough fluid to keep your urine pale yellow. If you vomit: Drink clear fluids slowly and in small amounts as you are able. Clear fluids include water, ice chips, low-calorie sports drinks, and fruit juice that has water added to it (diluted fruit juice). Eat light and bland foods in small amounts as you are able. These foods include bananas, applesauce, rice, lean meats, toast, and crackers. General instructions  Have a responsible adult stay with you for the time you are told. It is important to have someone help care for you until you are awake and alert. If you have sleep apnea, surgery and some medicines can increase your risk for breathing problems. Follow instructions from your health care provider about wearing your sleep device: When you are sleeping. This includes during daytime naps. While taking prescription pain medicines, sleeping medicines, or medicines that make you drowsy. Do not use any products that contain nicotine or tobacco. These products include cigarettes, chewing tobacco, and vaping devices, such as e-cigarettes. If you need help quitting, ask your health care provider. Contact a health care provider if: You feel nauseous or vomit every time you eat or drink. You feel light-headed. You are still sleepy or having trouble with balance after 24 hours. You get a rash. You have a fever. You  have redness or swelling around the IV site. Get help right away if: You have trouble breathing. You have new confusion after you get home. These symptoms may be an emergency. Get help right away. Call 911. Do not wait to see if the symptoms will go away. Do not drive yourself to the hospital. This information is not intended to replace advice given to you by your health care provider. Make sure you discuss any questions you have with your health care provider. Document Revised: 02/24/2022 Document Reviewed: 02/24/2022 Elsevier Patient Education  2024 [employer].

## 2023-03-31 ENCOUNTER — Encounter (HOSPITAL_COMMUNITY)
Admission: RE | Admit: 2023-03-31 | Discharge: 2023-03-31 | Disposition: A | Discharge status: Home / Self Care | Payer: 59 | Source: Ambulatory Visit | Attending: Internal Medicine | Admitting: Internal Medicine

## 2023-03-31 ENCOUNTER — Encounter (HOSPITAL_COMMUNITY): Payer: Self-pay

## 2023-03-31 VITALS — BP 128/66 | HR 56 | Temp 97.8°F | Resp 18 | Ht 66.0 in | Wt 256.8 lb

## 2023-03-31 DIAGNOSIS — Z01818 Encounter for other preprocedural examination: Secondary | ICD-10-CM | POA: Insufficient documentation

## 2023-03-31 DIAGNOSIS — E119 Type 2 diabetes mellitus without complications: Secondary | ICD-10-CM | POA: Diagnosis not present

## 2023-03-31 LAB — POCT PREGNANCY, URINE: Preg Test, Ur: NEGATIVE

## 2023-04-02 ENCOUNTER — Encounter (HOSPITAL_COMMUNITY)
Admission: RE | Disposition: A | Discharge status: Home / Self Care | Payer: Self-pay | Source: Home / Self Care | Attending: Internal Medicine

## 2023-04-02 ENCOUNTER — Ambulatory Visit (HOSPITAL_BASED_OUTPATIENT_CLINIC_OR_DEPARTMENT_OTHER): Payer: 59 | Admitting: Anesthesiology

## 2023-04-02 ENCOUNTER — Encounter (HOSPITAL_COMMUNITY): Payer: Self-pay

## 2023-04-02 ENCOUNTER — Ambulatory Visit (HOSPITAL_COMMUNITY): Payer: 59 | Admitting: Anesthesiology

## 2023-04-02 ENCOUNTER — Ambulatory Visit (HOSPITAL_COMMUNITY)
Admission: RE | Admit: 2023-04-02 | Discharge: 2023-04-02 | Disposition: A | Discharge status: Home / Self Care | Payer: 59 | Attending: Internal Medicine | Admitting: Internal Medicine

## 2023-04-02 DIAGNOSIS — R194 Change in bowel habit: Secondary | ICD-10-CM

## 2023-04-02 DIAGNOSIS — D123 Benign neoplasm of transverse colon: Secondary | ICD-10-CM | POA: Diagnosis not present

## 2023-04-02 DIAGNOSIS — K5909 Other constipation: Secondary | ICD-10-CM | POA: Diagnosis not present

## 2023-04-02 DIAGNOSIS — D509 Iron deficiency anemia, unspecified: Secondary | ICD-10-CM

## 2023-04-02 DIAGNOSIS — K2970 Gastritis, unspecified, without bleeding: Secondary | ICD-10-CM | POA: Insufficient documentation

## 2023-04-02 DIAGNOSIS — M1990 Unspecified osteoarthritis, unspecified site: Secondary | ICD-10-CM | POA: Insufficient documentation

## 2023-04-02 DIAGNOSIS — E119 Type 2 diabetes mellitus without complications: Secondary | ICD-10-CM | POA: Insufficient documentation

## 2023-04-02 DIAGNOSIS — Z79899 Other long term (current) drug therapy: Secondary | ICD-10-CM | POA: Diagnosis not present

## 2023-04-02 DIAGNOSIS — Z87891 Personal history of nicotine dependence: Secondary | ICD-10-CM

## 2023-04-02 DIAGNOSIS — K5730 Diverticulosis of large intestine without perforation or abscess without bleeding: Secondary | ICD-10-CM

## 2023-04-02 DIAGNOSIS — K648 Other hemorrhoids: Secondary | ICD-10-CM | POA: Insufficient documentation

## 2023-04-02 DIAGNOSIS — F418 Other specified anxiety disorders: Secondary | ICD-10-CM | POA: Diagnosis not present

## 2023-04-02 DIAGNOSIS — R1310 Dysphagia, unspecified: Secondary | ICD-10-CM | POA: Diagnosis not present

## 2023-04-02 DIAGNOSIS — Z791 Long term (current) use of non-steroidal anti-inflammatories (NSAID): Secondary | ICD-10-CM | POA: Diagnosis not present

## 2023-04-02 DIAGNOSIS — Z7985 Long-term (current) use of injectable non-insulin antidiabetic drugs: Secondary | ICD-10-CM | POA: Insufficient documentation

## 2023-04-02 DIAGNOSIS — Z09 Encounter for follow-up examination after completed treatment for conditions other than malignant neoplasm: Secondary | ICD-10-CM | POA: Insufficient documentation

## 2023-04-02 DIAGNOSIS — D126 Benign neoplasm of colon, unspecified: Secondary | ICD-10-CM | POA: Diagnosis not present

## 2023-04-02 DIAGNOSIS — K219 Gastro-esophageal reflux disease without esophagitis: Secondary | ICD-10-CM | POA: Insufficient documentation

## 2023-04-02 DIAGNOSIS — Z7984 Long term (current) use of oral hypoglycemic drugs: Secondary | ICD-10-CM | POA: Insufficient documentation

## 2023-04-02 DIAGNOSIS — D124 Benign neoplasm of descending colon: Secondary | ICD-10-CM | POA: Insufficient documentation

## 2023-04-02 HISTORY — PX: ESOPHAGOGASTRODUODENOSCOPY (EGD) WITH PROPOFOL: SHX5813

## 2023-04-02 HISTORY — PX: POLYPECTOMY: SHX5525

## 2023-04-02 HISTORY — PX: COLONOSCOPY WITH PROPOFOL: SHX5780

## 2023-04-02 HISTORY — PX: BIOPSY: SHX5522

## 2023-04-02 LAB — GLUCOSE, CAPILLARY: Glucose-Capillary: 89 mg/dL (ref 70–99)

## 2023-04-02 SURGERY — COLONOSCOPY WITH PROPOFOL
Anesthesia: General

## 2023-04-02 MED ORDER — OMEPRAZOLE 40 MG PO CPDR
40.0000 mg | DELAYED_RELEASE_CAPSULE | Freq: Two times a day (BID) | ORAL | 5 refills | Status: DC
Start: 2023-04-02 — End: 2023-06-24

## 2023-04-02 MED ORDER — PROPOFOL 10 MG/ML IV BOLUS
INTRAVENOUS | Status: DC | PRN
  Administered 2023-04-02: 50 mg via INTRAVENOUS
  Administered 2023-04-02: 100 mg via INTRAVENOUS

## 2023-04-02 MED ORDER — PROPOFOL 500 MG/50ML IV EMUL
INTRAVENOUS | Status: DC | PRN
  Administered 2023-04-02: 150 ug/kg/min via INTRAVENOUS

## 2023-04-02 MED ORDER — LACTATED RINGERS IV SOLN: INTRAVENOUS | Status: DC

## 2023-04-02 MED ORDER — LIDOCAINE HCL (CARDIAC) PF 100 MG/5ML IV SOSY
PREFILLED_SYRINGE | INTRAVENOUS | Status: DC | PRN
  Administered 2023-04-02: 50 mg via INTRAVENOUS

## 2023-04-02 NOTE — Transfer of Care (Signed)
Immediate Anesthesia Transfer of Care Note  Patient: [name]  Procedure(s) Performed: COLONOSCOPY WITH PROPOFOL ESOPHAGOGASTRODUODENOSCOPY (EGD) WITH PROPOFOL BIOPSY POLYPECTOMY  Patient Location: Short Stay  Anesthesia Type:General  Level of Consciousness: awake  Airway & Oxygen Therapy: Patient Spontanous Breathing  Post-op Assessment: Report given to RN and Post -op Vital signs reviewed and stable  Post vital signs: Reviewed and stable  Last Vitals:  Vitals Value Taken Time  BP 94/69 04/02/23 0818  Temp 37.1 C 04/02/23 0818  Pulse 70 04/02/23 0818  Resp 15 04/02/23 0818  SpO2 100 % 04/02/23 0818    Last Pain:  Vitals:   04/02/23 0818  TempSrc: Oral  PainSc: 0-No pain         Complications: No notable events documented.

## 2023-04-02 NOTE — Interval H&P Note (Signed)
History and Physical Interval Note:  04/02/2023 7:39 AM  [name]  has presented today for surgery, with the diagnosis of IDA,change in bowel habits,GERD,dysphagia.  The various methods of treatment have been discussed with the patient and family. After consideration of risks, benefits and other options for treatment, the patient has consented to  Procedure(s) with comments: COLONOSCOPY WITH PROPOFOL (N/A) - 8:15 am, asa 3, pt knows to arrive at 6:00 ESOPHAGOGASTRODUODENOSCOPY (EGD) WITH PROPOFOL (N/A) BALLOON DILATION (N/A) as a surgical intervention.  The patient's history has been reviewed, patient examined, no change in status, stable for surgery.  I have reviewed the patient's chart and labs.  Questions were answered to the patient's satisfaction.     [name]

## 2023-04-02 NOTE — Anesthesia Preprocedure Evaluation (Addendum)
Anesthesia Evaluation  Patient identified by MRN, date of birth, ID band Patient awake    Reviewed: Allergy & Precautions, H&P , NPO status , Patient's Chart, lab work & pertinent test results  Airway Mallampati: II  TM Distance: >3 FB Neck ROM: Full    Dental  (+) Edentulous Lower, Edentulous Upper   Pulmonary former smoker   Pulmonary exam normal breath sounds clear to auscultation       Cardiovascular negative cardio ROS Normal cardiovascular exam Rhythm:Regular Rate:Normal     Neuro/Psych  PSYCHIATRIC DISORDERS Anxiety Depression    negative neurological ROS     GI/Hepatic Neg liver ROS,GERD  Medicated and Poorly Controlled,,  Endo/Other  diabetes (trulicity - 2 weeks ago), Well Controlled, Type 2, Oral Hypoglycemic Agents    Renal/GU negative Renal ROS  negative genitourinary   Musculoskeletal  (+) Arthritis , Osteoarthritis,    Abdominal   Peds negative pediatric ROS (+)  Hematology  (+) Blood dyscrasia, anemia   Anesthesia Other Findings   Reproductive/Obstetrics negative OB ROS                             Anesthesia Physical Anesthesia Plan  ASA: 3  Anesthesia Plan: General   Post-op Pain Management: Minimal or no pain anticipated   Induction: Intravenous  PONV Risk Score and Plan: 1 and Propofol infusion  Airway Management Planned: Nasal Cannula and Natural Airway  Additional Equipment:   Intra-op Plan:   Post-operative Plan:   Informed Consent: I have reviewed the patients History and Physical, chart, labs and discussed the procedure including the risks, benefits and alternatives for the proposed anesthesia with the patient or authorized representative who has indicated his/her understanding and acceptance.     Dental advisory given  Plan Discussed with: CRNA and Surgeon  Anesthesia Plan Comments:        Anesthesia Quick Evaluation

## 2023-04-02 NOTE — Anesthesia Postprocedure Evaluation (Signed)
Anesthesia Post Note  Patient: [name]  Procedure(s) Performed: COLONOSCOPY WITH PROPOFOL ESOPHAGOGASTRODUODENOSCOPY (EGD) WITH PROPOFOL BIOPSY POLYPECTOMY  Patient location during evaluation: Phase II Anesthesia Type: General Level of consciousness: awake and alert and oriented Pain management: pain level controlled Vital Signs Assessment: post-procedure vital signs reviewed and stable Respiratory status: spontaneous breathing, nonlabored ventilation and respiratory function stable Cardiovascular status: blood pressure returned to baseline and stable Postop Assessment: no apparent nausea or vomiting Anesthetic complications: no  No notable events documented.   Last Vitals:  Vitals:   04/02/23 0646 04/02/23 0818  BP: 119/67 94/69  Pulse:  70  Resp:  15  Temp:  37.1 C  SpO2:  100%    Last Pain:  Vitals:   04/02/23 0818  TempSrc: Oral  PainSc: 0-No pain                 [name] Estuardo Frisbee

## 2023-04-02 NOTE — Discharge Instructions (Signed)
EGD Discharge instructions Please read the instructions outlined below and refer to this sheet in the next few weeks. These discharge instructions provide you with general information on caring for yourself after you leave the hospital. Your doctor may also give you specific instructions. While your treatment has been planned according to the most current medical practices available, unavoidable complications occasionally occur. If you have any problems or questions after discharge, please call your doctor. ACTIVITY You may resume your regular activity but move at a slower pace for the next 24 hours.  Take frequent rest periods for the next 24 hours.  Walking will help expel (get rid of) the air and reduce the bloated feeling in your abdomen.  No driving for 24 hours (because of the anesthesia (medicine) used during the test).  You may shower.  Do not sign any important legal documents or operate any machinery for 24 hours (because of the anesthesia used during the test).  NUTRITION Drink plenty of fluids.  You may resume your normal diet.  Begin with a light meal and progress to your normal diet.  Avoid alcoholic beverages for 24 hours or as instructed by your caregiver.  MEDICATIONS You may resume your normal medications unless your caregiver tells you otherwise.  WHAT YOU CAN EXPECT TODAY You may experience abdominal discomfort such as a feeling of fullness or "gas" pains.  FOLLOW-UP Your doctor will discuss the results of your test with you.  SEEK IMMEDIATE MEDICAL ATTENTION IF ANY OF THE FOLLOWING OCCUR: Excessive nausea (feeling sick to your stomach) and/or vomiting.  Severe abdominal pain and distention (swelling).  Trouble swallowing.  Temperature over 101 F (37.8 C).  Rectal bleeding or vomiting of blood.     Colonoscopy Discharge Instructions  Read the instructions outlined below and refer to this sheet in the next few weeks. These discharge instructions provide you with  general information on caring for yourself after you leave the hospital. Your doctor may also give you specific instructions. While your treatment has been planned according to the most current medical practices available, unavoidable complications occasionally occur.   ACTIVITY You may resume your regular activity, but move at a slower pace for the next 24 hours.  Take frequent rest periods for the next 24 hours.  Walking will help get rid of the air and reduce the bloated feeling in your belly (abdomen).  No driving for 24 hours (because of the medicine (anesthesia) used during the test).   Do not sign any important legal documents or operate any machinery for 24 hours (because of the anesthesia used during the test).  NUTRITION Drink plenty of fluids.  You may resume your normal diet as instructed by your doctor.  Begin with a light meal and progress to your normal diet. Heavy or fried foods are harder to digest and may make you feel sick to your stomach (nauseated).  Avoid alcoholic beverages for 24 hours or as instructed.  MEDICATIONS You may resume your normal medications unless your doctor tells you otherwise.  WHAT YOU CAN EXPECT TODAY Some feelings of bloating in the abdomen.  Passage of more gas than usual.  Spotting of blood in your stool or on the toilet paper.  IF YOU HAD POLYPS REMOVED DURING THE COLONOSCOPY: No aspirin products for 7 days or as instructed.  No alcohol for 7 days or as instructed.  Eat a soft diet for the next 24 hours.  FINDING OUT THE RESULTS OF YOUR TEST Not all test results are  available during your visit. If your test results are not back during the visit, make an appointment with your caregiver to find out the results. Do not assume everything is normal if you have not heard from your caregiver or the medical facility. It is important for you to follow up on all of your test results.  SEEK IMMEDIATE MEDICAL ATTENTION IF: You have more than a spotting of  blood in your stool.  Your belly is swollen (abdominal distention).  You are nauseated or vomiting.  You have a temperature over 101.  You have abdominal pain or discomfort that is severe or gets worse throughout the day.   Your EGD revealed mild amount inflammation in your stomach.  I took biopsies of this to rule out infection with a bacteria called H. pylori.  Await pathology results, my office will contact you.  Esophagus and small bowel appeared normal.   I am going to increase your Omeprazole to 40 mg twice daily for the next 12 weeks at which point you can decrease down to once daily.   Your colonoscopy revealed 3 polyp(s) which I removed successfully. Await pathology results, my office will contact you. I recommend repeating colonoscopy in 5 years for surveillance purposes.   You also have diverticulosis and internal hemorrhoids. I would recommend increasing fiber in your diet or adding OTC Benefiber/Metamucil. Be sure to drink at least 4 to 6 glasses of water daily. Follow-up with GI in 2-3 months   I hope you have a great rest of your week!  [name]. Marletta Lor, D.O. Gastroenterology and Hepatology [facility]

## 2023-04-02 NOTE — Op Note (Signed)
[facility] Patient Name: [name] Procedure Date: 04/02/2023 7:14 AM MRN: [id number] Date of Birth: [birth year] Attending MD: Hennie Duos. Maple Mirza, [id number] CSN: [id number] Age: 42 Admit Type: Outpatient Procedure:                Upper GI endoscopy Indications:              Iron deficiency anemia, Dysphagia, Nausea Providers:                Hennie Duos. Marletta Lor, DO, Buel Ream. Thomasena Edis RN, RN,                            Zena Amos Referring MD:              Medicines:                See the Anesthesia note for documentation of the                            administered medications Complications:            No immediate complications. Estimated Blood Loss:     Estimated blood loss was minimal. Procedure:                Pre-Anesthesia Assessment:                           - The anesthesia plan was to use monitored                            anesthesia care (MAC).                           After obtaining informed consent, the endoscope was                            passed under direct vision. Throughout the                            procedure, the patient's blood pressure, pulse, and                            oxygen saturations were monitored continuously. The                            [id number]-H190 ([id number]) scope was introduced through the                            mouth, and advanced to the second part of duodenum.                            The upper GI endoscopy was accomplished without                            difficulty. The patient tolerated the procedure                            well.  Scope In: 7:47:30 AM Scope Out: 7:50:27 AM Total Procedure Duration: 0 hours 2 minutes 57 seconds  Findings:      The examined esophagus was normal.      Patchy mild inflammation characterized by erythema was found in the       gastric body and in the gastric antrum. Biopsies were taken with a cold       forceps for Helicobacter pylori testing.      The duodenal bulb, first  portion of the duodenum and second portion of       the duodenum were normal. Impression:               - Normal esophagus.                           - Gastritis. Biopsied.                           - Normal duodenal bulb, first portion of the                            duodenum and second portion of the duodenum. Moderate Sedation:      Per Anesthesia Care Recommendation:           - Patient has a contact number available for                            emergencies. The signs and symptoms of potential                            delayed complications were discussed with the                            patient. Return to normal activities tomorrow.                            Written discharge instructions were provided to the                            patient.                           - Resume previous diet.                           - Continue present medications.                           - Await pathology results.                           - Use a proton pump inhibitor PO BID.                           - Return to GI clinic in 3 months. Procedure Code(s):        --- Professional ---  16109, Esophagogastroduodenoscopy, flexible,                            transoral; with biopsy, single or multiple Diagnosis Code(s):        --- Professional ---                           K29.70, Gastritis, unspecified, without bleeding                           D50.9, Iron deficiency anemia, unspecified                           R13.10, Dysphagia, unspecified                           R11.0, Nausea CPT copyright 2022 American Medical Association. All rights reserved. The codes documented in this report are preliminary and upon coder review may  be revised to meet current compliance requirements. Hennie Duos. Marletta Lor, DO Hennie Duos. Marletta Lor, DO 04/02/2023 7:54:02 AM This report has been signed electronically. Number of Addenda: 0

## 2023-04-02 NOTE — Op Note (Signed)
[facility] Patient Name: Keshanda Freshley Procedure Date: 04/02/2023 7:15 AM MRN: [id number] Date of Birth: 1981/09/18 Attending MD: Hennie Duos. Maple Mirza, [id number] CSN: [id number] Age: 42 Admit Type: Outpatient Procedure:                Colonoscopy Indications:              Iron deficiency anemia Providers:                Hennie Duos. Marletta Lor, DO, Buel Ream. Thomasena Edis RN, RN,                            Zena Amos Referring MD:              Medicines:                See the Anesthesia note for documentation of the                            administered medications Complications:            No immediate complications. Estimated Blood Loss:     Estimated blood loss was minimal. Procedure:                Pre-Anesthesia Assessment:                           - The anesthesia plan was to use monitored                            anesthesia care (MAC).                           After obtaining informed consent, the colonoscope                            was passed under direct vision. Throughout the                            procedure, the patient's blood pressure, pulse, and                            oxygen saturations were monitored continuously. The                            [id number]-HQ190L ([id number]) scope was introduced through                            the anus and advanced to the the cecum, identified                            by appendiceal orifice and ileocecal valve. The                            colonoscopy was performed without difficulty. The                            patient tolerated the procedure well. The  quality                            of the bowel preparation was evaluated using the                            BBPS [facility] Bowel Preparation Scale) with scores                            of: Right Colon = 2 (minor amount of residual                            staining, small fragments of stool and/or opaque                            liquid, but mucosa seen well),  Transverse Colon = 2                            (minor amount of residual staining, small fragments                            of stool and/or opaque liquid, but mucosa seen                            well) and Left Colon = 2 (minor amount of residual                            staining, small fragments of stool and/or opaque                            liquid, but mucosa seen well). The total BBPS score                            equals 6. The quality of the bowel preparation was                            good. Scope In: 7:54:36 AM Scope Out: 8:14:09 AM Scope Withdrawal Time: 0 hours 16 minutes 51 seconds  Total Procedure Duration: 0 hours 19 minutes 33 seconds  Findings:      Non-bleeding internal hemorrhoids were found during endoscopy.      Many medium-mouthed and small-mouthed diverticula were found in the       sigmoid colon and transverse colon.      Three sessile polyps were found in the descending colon and transverse       colon. The polyps were 4 to 6 mm in size. These polyps were removed with       a cold snare. Resection and retrieval were complete. Impression:               - Non-bleeding internal hemorrhoids.                           - Diverticulosis in the sigmoid colon and in the  transverse colon.                           - Three 4 to 6 mm polyps in the descending colon                            and in the transverse colon, removed with a cold                            snare. Resected and retrieved. Moderate Sedation:      Per Anesthesia Care Recommendation:           - Patient has a contact number available for                            emergencies. The signs and symptoms of potential                            delayed complications were discussed with the                            patient. Return to normal activities tomorrow.                            Written discharge instructions were provided to the                             patient.                           - Resume previous diet.                           - Continue present medications.                           - Await pathology results.                           - Repeat colonoscopy in 5 years for surveillance.                           - Return to GI clinic in 3 months. Procedure Code(s):        --- Professional ---                           [phone], Colonoscopy, flexible; with removal of                            tumor(s), polyp(s), or other lesion(s) by snare                            technique Diagnosis Code(s):        --- Professional ---  K64.8, Other hemorrhoids                           D12.4, Benign neoplasm of descending colon                           D12.3, Benign neoplasm of transverse colon (hepatic                            flexure or splenic flexure)                           D50.9, Iron deficiency anemia, unspecified                           K57.30, Diverticulosis of large intestine without                            perforation or abscess without bleeding CPT copyright 2022 American Medical Association. All rights reserved. The codes documented in this report are preliminary and upon coder review may  be revised to meet current compliance requirements. Hennie Duos. Marletta Lor, DO Hennie Duos. Marletta Lor, DO 04/02/2023 8:18:54 AM This report has been signed electronically. Number of Addenda: 0

## 2023-04-03 LAB — SURGICAL PATHOLOGY

## 2023-04-07 ENCOUNTER — Encounter (HOSPITAL_COMMUNITY): Payer: Self-pay | Admitting: Internal Medicine

## 2023-04-12 ENCOUNTER — Other Ambulatory Visit: Payer: Self-pay | Admitting: Advanced Practice Midwife

## 2023-04-13 ENCOUNTER — Other Ambulatory Visit: Payer: Self-pay | Admitting: Internal Medicine

## 2023-04-13 ENCOUNTER — Other Ambulatory Visit: Payer: Self-pay | Admitting: Obstetrics & Gynecology

## 2023-04-13 DIAGNOSIS — F419 Anxiety disorder, unspecified: Secondary | ICD-10-CM

## 2023-04-22 ENCOUNTER — Other Ambulatory Visit: Payer: Self-pay | Admitting: Internal Medicine

## 2023-04-24 ENCOUNTER — Other Ambulatory Visit: Payer: Self-pay | Admitting: Internal Medicine

## 2023-04-27 ENCOUNTER — Encounter: Payer: Self-pay | Admitting: Internal Medicine

## 2023-04-27 ENCOUNTER — Ambulatory Visit (INDEPENDENT_AMBULATORY_CARE_PROVIDER_SITE_OTHER): Payer: 59 | Admitting: Internal Medicine

## 2023-04-27 VITALS — BP 123/76 | HR 76 | Ht 66.0 in | Wt 261.6 lb

## 2023-04-27 DIAGNOSIS — D508 Other iron deficiency anemias: Secondary | ICD-10-CM

## 2023-04-27 DIAGNOSIS — E119 Type 2 diabetes mellitus without complications: Secondary | ICD-10-CM | POA: Diagnosis not present

## 2023-04-27 MED ORDER — OZEMPIC (0.25 OR 0.5 MG/DOSE) 2 MG/3ML ~~LOC~~ SOPN
0.2500 mg | PEN_INJECTOR | SUBCUTANEOUS | 0 refills | Status: DC
Start: 2023-04-27 — End: 2023-05-27

## 2023-04-27 MED ORDER — LISINOPRIL 5 MG PO TABS
5.0000 mg | ORAL_TABLET | Freq: Every day | ORAL | 3 refills | Status: DC
Start: 2023-04-27 — End: 2024-05-18

## 2023-04-27 NOTE — Assessment & Plan Note (Signed)
Noted on recent labs.  She has started oral iron supplementation.  EGD/colonoscopy performed last month was negative for acute findings to explain her symptoms.  She endorses a history of menorrhagia and is currently prescribed Megace and lubiprostone. -Repeat CBC/iron studies ordered today

## 2023-04-27 NOTE — Patient Instructions (Signed)
It was a pleasure to see you today.  Thank you for giving [place] the opportunity to be involved in your care.  Below is a brief recap of your visit and next steps.  We will plan to see you again in 6 months.  Summary Start Ozempic 0.25 mg daily Start lisinopril 5 mg daily Repeat labs ordered Follow up in 6 months

## 2023-04-27 NOTE — Assessment & Plan Note (Signed)
A1c 5.7 in January.  She is currently prescribed metformin XR 500 mg daily and Trulicity 3 mg weekly.  She reports today that she has not been able to fill prescriptions for Trulicity due to a supply shortage.  This is a recurrent issue. -Discontinue Trulicity in favor of Ozempic 0.25 mg weekly -Repeat A1c ordered today -She is currently prescribed simvastatin.  Through shared decision making, lisinopril 5 mg daily has been started today for renal protection in the setting of diabetes mellitus -Additional DM related preventative care items are up-to-date

## 2023-04-27 NOTE — Progress Notes (Signed)
Established Patient Office Visit  Subjective   Patient ID: [name], female    DOB: June 14, 1981  Age: 41 y.o. MRN: [id number]  Chief Complaint  Patient presents with   Diabetes    Follow up   [name] returns to care today for routine follow-up.  She was last evaluated by me on 5/22 in the setting of diarrhea and iron deficiency.  She was referred to gastroenterology and ultimately underwent EGD/colonoscopy with Dr. Marletta Lor on 6/20.  EGD demonstrated gastritis.  H. pylori testing negative.  Colonoscopy revealed multiple tubular adenomas.  Repeat colonoscopy was recommended for 5 years.  There have otherwise been no acute interval events. [name] reports feeling well today.  She is asymptomatic and has no acute concerns to discuss.  Past Medical History:  Diagnosis Date   Anxiety    Arthritis    Depression    Diabetes (HCC)    GERD (gastroesophageal reflux disease)    Hypercholesteremia    Sinus infection 03/27/2021   Past Surgical History:  Procedure Laterality Date   BIOPSY  04/02/2023   Procedure: BIOPSY;  Surgeon: [name], DO;  Location: [facility];  Service: Endoscopy;;   BREAST BIOPSY Left 04/03/2021   Procedure: BREAST BIOPSY; excision of duct lesion;  Surgeon: Lucretia Roers, MD;  Location: [facility];  Service: General;  Laterality: Left;   CHOLECYSTECTOMY N/A 07/13/2014   Procedure: LAPAROSCOPIC CHOLECYSTECTOMY;  Surgeon: Marlane Hatcher, MD;  Location: [facility];  Service: General;  Laterality: N/A;   COLONOSCOPY WITH PROPOFOL N/A 04/02/2023   Procedure: COLONOSCOPY WITH PROPOFOL;  Surgeon: [name], DO;  Location: [facility];  Service: Endoscopy;  Laterality: N/A;  8:15 am, asa 3, pt knows to arrive at 6:00   DENTAL SURGERY  2018   plate top & bottom   DILATATION AND CURETTAGE/HYSTEROSCOPY WITH MINERVA N/A 04/16/2022   Procedure: DILATATION AND CURETTAGE/HYSTEROSCOPY WITH MINERVA;  Surgeon: Lazaro Arms, MD;  Location: [facility];  Service:  Gynecology;  Laterality: N/A;   ESOPHAGOGASTRODUODENOSCOPY (EGD) WITH PROPOFOL N/A 04/02/2023   Procedure: ESOPHAGOGASTRODUODENOSCOPY (EGD) WITH PROPOFOL;  Surgeon: [name], DO;  Location: [facility];  Service: Endoscopy;  Laterality: N/A;   POLYPECTOMY  04/02/2023   Procedure: POLYPECTOMY;  Surgeon: [name], DO;  Location: [facility];  Service: Endoscopy;;   TUBAL LIGATION     Social History   Tobacco Use   Smoking status: Former    Current packs/day: 0.00    Average packs/day: 0.5 packs/day for 15.0 years (7.5 ttl pk-yrs)    Types: Cigarettes    Start date: 11/13/2004    Quit date: 11/14/2019    Years since quitting: 3.4   Smokeless tobacco: Never  Vaping Use   Vaping status: Never Used  Substance Use Topics   Alcohol use: No   Drug use: No   Family History  Problem Relation Age of Onset   Heart disease Mother    Diabetes Mother    Heart disease Father    Hypertension Father    Thyroid disease Father    Hyperlipidemia Father    Breast cancer Paternal Grandmother    Breast cancer Paternal Aunt    Cancer - Colon Paternal Uncle    Colon cancer Neg Hx    No Known Allergies  Review of Systems  Constitutional:  Negative for chills and fever.  HENT:  Negative for sore throat.   Respiratory:  Negative for cough and shortness of  breath.   Cardiovascular:  Negative for chest pain, palpitations and leg swelling.  Gastrointestinal:  Negative for abdominal pain, blood in stool, constipation, diarrhea, nausea and vomiting.  Genitourinary:  Negative for dysuria and hematuria.  Musculoskeletal:  Negative for myalgias.  Skin:  Negative for itching and rash.  Neurological:  Negative for dizziness and headaches.  Psychiatric/Behavioral:  Negative for depression and suicidal ideas.      Objective:     BP 123/76   Pulse 76   Ht 5\' 6"  (1.676 m)   Wt 261 lb 9.6 oz (118.7 kg)   SpO2 97%   BMI 42.22 kg/m  BP Readings from Last 3 Encounters:  04/27/23 123/76   04/02/23 94/69  03/31/23 128/66   Physical Exam Vitals reviewed.  Constitutional:      General: She is not in acute distress.    Appearance: Normal appearance. She is obese. She is not toxic-appearing.  HENT:     Head: Normocephalic and atraumatic.     Right Ear: External ear normal.     Left Ear: External ear normal.     Nose: Nose normal. No congestion or rhinorrhea.     Mouth/Throat:     Mouth: Mucous membranes are moist.     Pharynx: Oropharynx is clear. No oropharyngeal exudate or posterior oropharyngeal erythema.  Eyes:     General: No scleral icterus.    Extraocular Movements: Extraocular movements intact.     Conjunctiva/sclera: Conjunctivae normal.     Pupils: Pupils are equal, round, and reactive to light.  Cardiovascular:     Rate and Rhythm: Normal rate and regular rhythm.     Pulses: Normal pulses.     Heart sounds: Normal heart sounds. No murmur heard.    No friction rub. No gallop.  Pulmonary:     Effort: Pulmonary effort is normal.     Breath sounds: Normal breath sounds. No wheezing, rhonchi or rales.  Abdominal:     General: Abdomen is flat. Bowel sounds are normal. There is no distension.     Palpations: Abdomen is soft.     Tenderness: There is no abdominal tenderness.  Musculoskeletal:        General: No swelling. Normal range of motion.     Cervical back: Normal range of motion.     Right lower leg: No edema.     Left lower leg: No edema.  Lymphadenopathy:     Cervical: No cervical adenopathy.  Skin:    General: Skin is warm and dry.     Capillary Refill: Capillary refill takes less than 2 seconds.     Coloration: Skin is not jaundiced.  Neurological:     General: No focal deficit present.     Mental Status: She is alert and oriented to person, place, and time.  Psychiatric:        Mood and Affect: Mood normal.        Behavior: Behavior normal.   Last CBC Lab Results  Component Value Date   WBC 4.6 03/12/2023   HGB 9.8 (L) 03/12/2023    HCT 31.4 (L) 03/12/2023   MCV 78.5 (L) 03/12/2023   MCH 24.5 (L) 03/12/2023   RDW 16.8 (H) 03/12/2023   PLT 352 03/12/2023   Last metabolic panel Lab Results  Component Value Date   GLUCOSE 76 02/18/2023   NA 140 02/18/2023   K 4.5 02/18/2023   CL 106 02/18/2023   CO2 20 02/18/2023   BUN 13 02/18/2023   CREATININE 1.09 (  H) 02/18/2023   EGFR 65 02/18/2023   CALCIUM 9.7 02/18/2023   PROT 7.3 02/18/2023   ALBUMIN 4.7 02/18/2023   LABGLOB 2.6 02/18/2023   AGRATIO 1.8 02/18/2023   BILITOT 0.2 02/18/2023   ALKPHOS 39 (L) 02/18/2023   AST 25 02/18/2023   ALT 28 02/18/2023   ANIONGAP 7 04/14/2022   Last lipids Lab Results  Component Value Date   CHOL 130 07/18/2022   HDL 29 (L) 07/18/2022   LDLCALC 77 07/18/2022   TRIG 134 07/18/2022   CHOLHDL 4.5 (H) 07/18/2022   Last hemoglobin A1c Lab Results  Component Value Date   HGBA1C 5.7 10/20/2022   Last thyroid functions Lab Results  Component Value Date   TSH 1.230 02/10/2022   Last vitamin D Lab Results  Component Value Date   VD25OH 20.3 (L) 10/20/2022   The 10-year ASCVD risk score (Arnett DK, et al., 2019) is: 1.6%    Assessment & Plan:   Problem List Items Addressed This Visit       Type 2 diabetes mellitus without complications (HCC) - Primary    A1c 5.7 in January.  She is currently prescribed metformin XR 500 mg daily and Trulicity 3 mg weekly.  She reports today that she has not been able to fill prescriptions for Trulicity due to a supply shortage.  This is a recurrent issue. -Discontinue Trulicity in favor of Ozempic 0.25 mg weekly -Repeat A1c ordered today -She is currently prescribed simvastatin.  Through shared decision making, lisinopril 5 mg daily has been started today for renal protection in the setting of diabetes mellitus -Additional DM related preventative care items are up-to-date      Iron deficiency anemia    Noted on recent labs.  She has started oral iron supplementation.   EGD/colonoscopy performed last month was negative for acute findings to explain her symptoms.  She endorses a history of menorrhagia and is currently prescribed Megace and lubiprostone. -Repeat CBC/iron studies ordered today      Return in about 6 months (around 10/28/2023).   Billie Lade, MD

## 2023-04-28 LAB — CBC WITH DIFFERENTIAL/PLATELET
Basophils Absolute: 0.1 10*3/uL (ref 0.0–0.2)
Basos: 1 %
EOS (ABSOLUTE): 0.1 10*3/uL (ref 0.0–0.4)
Eos: 2 %
Hematocrit: 39.4 % (ref 34.0–46.6)
Hemoglobin: 12.7 g/dL (ref 11.1–15.9)
Immature Grans (Abs): 0 10*3/uL (ref 0.0–0.1)
Immature Granulocytes: 0 %
Lymphocytes Absolute: 1.3 10*3/uL (ref 0.7–3.1)
Lymphs: 32 %
MCH: 28.2 pg (ref 26.6–33.0)
MCHC: 32.2 g/dL (ref 31.5–35.7)
MCV: 87 fL (ref 79–97)
Monocytes Absolute: 0.3 10*3/uL (ref 0.1–0.9)
Monocytes: 8 %
Neutrophils Absolute: 2.2 10*3/uL (ref 1.4–7.0)
Neutrophils: 57 %
Platelets: 279 10*3/uL (ref 150–450)
RBC: 4.51 x10E6/uL (ref 3.77–5.28)
RDW: 21.4 % — ABNORMAL HIGH (ref 11.7–15.4)
WBC: 3.9 10*3/uL (ref 3.4–10.8)

## 2023-04-28 LAB — HEMOGLOBIN A1C
Est. average glucose Bld gHb Est-mCnc: 120 mg/dL
Hgb A1c MFr Bld: 5.8 % — ABNORMAL HIGH (ref 4.8–5.6)

## 2023-04-28 LAB — IRON,TIBC AND FERRITIN PANEL
Ferritin: 21 ng/mL (ref 15–150)
Iron Saturation: 29 % (ref 15–55)
Iron: 129 ug/dL (ref 27–159)
Total Iron Binding Capacity: 444 ug/dL (ref 250–450)
UIBC: 315 ug/dL (ref 131–425)

## 2023-05-09 ENCOUNTER — Other Ambulatory Visit: Payer: Self-pay | Admitting: Internal Medicine

## 2023-05-09 DIAGNOSIS — E559 Vitamin D deficiency, unspecified: Secondary | ICD-10-CM

## 2023-05-11 ENCOUNTER — Telehealth: Payer: Self-pay | Admitting: Internal Medicine

## 2023-05-11 NOTE — Telephone Encounter (Signed)
Med needs pre auth     Semaglutide,0.25 or 0.5MG /DOS, (OZEMPIC, 0.25 OR 0.5 MG/DOSE,) 2 MG/3ML SOPN

## 2023-05-13 NOTE — Telephone Encounter (Signed)
PA submitted.

## 2023-05-18 ENCOUNTER — Other Ambulatory Visit: Payer: Self-pay | Admitting: Internal Medicine

## 2023-05-18 DIAGNOSIS — F419 Anxiety disorder, unspecified: Secondary | ICD-10-CM

## 2023-05-27 ENCOUNTER — Other Ambulatory Visit: Payer: Self-pay

## 2023-05-27 ENCOUNTER — Encounter: Payer: Self-pay | Admitting: Internal Medicine

## 2023-05-27 DIAGNOSIS — E119 Type 2 diabetes mellitus without complications: Secondary | ICD-10-CM

## 2023-05-27 MED ORDER — TRULICITY 0.75 MG/0.5ML ~~LOC~~ SOAJ
0.7500 mg | SUBCUTANEOUS | 0 refills | Status: DC
Start: 2023-05-27 — End: 2023-06-25

## 2023-05-27 NOTE — Telephone Encounter (Signed)
Spoke with patient and will send in medications. Will start trulicity over due to patient being off medication for a while.

## 2023-05-28 ENCOUNTER — Encounter: Payer: Self-pay | Admitting: Internal Medicine

## 2023-06-17 ENCOUNTER — Other Ambulatory Visit: Payer: Self-pay | Admitting: Internal Medicine

## 2023-06-17 DIAGNOSIS — F419 Anxiety disorder, unspecified: Secondary | ICD-10-CM

## 2023-06-20 NOTE — Progress Notes (Unsigned)
Referring Provider: Billie Lade, MD Primary Care Physician:  Billie Lade, MD Primary GI Physician: Dr. Marletta Lor  Chief Complaint  Patient presents with   Follow-up    Follow up. No problems     HPI:   [name] is a 42 y.o. female presenting today for follow-up of IDA, GERD, nausea, weight loss, change in bowel habits with constipation.  Last seen in our office 03/05/2023 at the time of initial consult.  Noted hemoglobin previously in the 12 range, down to 9.5 on 02/26/2023 with ferritin 4 and started on iron at that time.  Denied overt GI bleeding or any other obvious blood loss.  She was taking meloxicam twice a week for hip pain.  Noted chronic constipation with bowel movements about once a week, but now feeling that she had to have a bowel movement but passing mucus only per rectum 3-4 times a day.  Also noted nausea with decreased p.o. intake and subsequent weight loss.  Taking over-the-counter Prilosec daily and Tums at night for chronic reflux which was not adequately managed.  Rare dysphagia.  Plan included: Update CBC, EGD with possible dilation, colonoscopy, limit meloxicam, start Linzess 145 mcg daily, start omeprazole 40 mg daily.  CBC with hemoglobin stable at 9.8.  Insurance did not cover Linzess, so she was started on Amitiza 8 mcg twice daily.  Procedures 04/02/2023: Colonoscopy: Nonbleeding internal hemorrhoids diverticulosis in the sigmoid and transverse colon, three 4-6 mm polyps resected and retrieved.  Recommended repeat colonoscopy in 5 years.  Pathology with tubular adenoma  EGD: Normal esophagus, gastritis biopsied, normal examined duodenum.  Recommended PPI twice daily. Pathology was benign, no H. pylori.  Most recent labs 04/27/2023 with hemoglobin normalized to 12.7, normocytic indices, iron levels within normal limits.  Today: IDA: No brbpr, melena, or vaginal bleeding. Hasn't had menstrual cycle in 2 years or more as she is on birth control.   She is taking iron daily. Using meloxicam only as needed.  Fairly infrequent.  GERD: Well controlled on omeprazole BID.  Think she would have breakthrough symptoms depending on what she ate if she only took omeprazole once a day.  Nausea: Resolved.   Weight loss: Resolved.  Wt Readings from Last 3 Encounters:  06/24/23 262 lb 9.6 oz (119.1 kg)  04/27/23 261 lb 9.6 oz (118.7 kg)  04/02/23 256 lb 13.4 oz (116.5 kg)   Constipation: Improved but still having some trouble. Bms every 3 days or so.   Past Medical History:  Diagnosis Date   Anxiety    Arthritis    Depression    Diabetes (HCC)    GERD (gastroesophageal reflux disease)    Hypercholesteremia    Sinus infection 03/27/2021    Past Surgical History:  Procedure Laterality Date   BIOPSY  04/02/2023   Procedure: BIOPSY;  Surgeon: [name], DO;  [facility]: [facility];  Service: Endoscopy;;   BREAST BIOPSY Left 04/03/2021   Procedure: BREAST BIOPSY; excision of duct lesion;  Surgeon: Lucretia Roers, MD;  Location: [facility];  Service: General;  Laterality: Left;   CHOLECYSTECTOMY N/A 07/13/2014   Procedure: LAPAROSCOPIC CHOLECYSTECTOMY;  Surgeon: Marlane Hatcher, MD;  Location: [facility];  Service: General;  Laterality: N/A;   COLONOSCOPY WITH PROPOFOL N/A 04/02/2023   Procedure: COLONOSCOPY WITH PROPOFOL;  Surgeon: [name], DO;  Location: [facility];  Service: Endoscopy;  Laterality: N/A;  8:15 am, asa 3, pt knows to arrive at 6:00  DENTAL SURGERY  2018   plate top & bottom   DILATATION AND CURETTAGE/HYSTEROSCOPY WITH MINERVA N/A 04/16/2022   Procedure: DILATATION AND CURETTAGE/HYSTEROSCOPY WITH MINERVA;  Surgeon: Lazaro Arms, MD;  Location: [facility];  Service: Gynecology;  Laterality: N/A;   ESOPHAGOGASTRODUODENOSCOPY (EGD) WITH PROPOFOL N/A 04/02/2023   Procedure: ESOPHAGOGASTRODUODENOSCOPY (EGD) WITH PROPOFOL;  Surgeon: [name], DO;  [facility]: [facility];  Service: Endoscopy;   Laterality: N/A;   POLYPECTOMY  04/02/2023   Procedure: POLYPECTOMY;  Surgeon: [name], DO;  Location: [facility];  Service: Endoscopy;;   TUBAL LIGATION      Current Outpatient Medications  Medication Sig Dispense Refill   citalopram (CELEXA) 40 MG tablet Take 1 tablet by mouth once daily 30 tablet 0   Dulaglutide (TRULICITY) 0.75 MG/0.5ML SOPN Inject 0.75 mg into the skin once a week. 2 mL 0   fenofibrate (TRICOR) 145 MG tablet Take 1 tablet by mouth once daily 30 tablet 2   Ferrous Sulfate (IRON) 325 (65 Fe) MG TABS Take 325 mg by mouth daily.     fluticasone (FLONASE) 50 MCG/ACT nasal spray Place 2 sprays into both nostrils daily. 16 g 6   lisinopril (ZESTRIL) 5 MG tablet Take 1 tablet (5 mg total) by mouth daily. 90 tablet 3   lubiprostone (AMITIZA) 24 MCG capsule Take 1 capsule (24 mcg total) by mouth 2 (two) times daily with a meal. 60 capsule 3   megestrol (MEGACE) 40 MG tablet Take 1 tablet (40 mg total) by mouth daily. 30 tablet 11   meloxicam (MOBIC) 15 MG tablet TAKE 1 TABLET BY MOUTH ONCE DAILY AS NEEDED FOR PAIN 30 tablet 0   metFORMIN (GLUCOPHAGE-XR) 500 MG 24 hr tablet Take 1 tablet by mouth once daily with breakfast 30 tablet 0   norethindrone (MICRONOR) 0.35 MG tablet Take 1 tablet by mouth once daily 84 tablet 3   simvastatin (ZOCOR) 40 MG tablet TAKE 1 TABLET BY MOUTH AT BEDTIME 90 tablet 0   Vitamin D, Ergocalciferol, (DRISDOL) 1.25 MG (50000 UNIT) CAPS capsule Take 1 capsule by mouth once a week 8 capsule 0   omeprazole (PRILOSEC) 40 MG capsule Take 1 capsule (40 mg total) by mouth 1-2 times daily before meals for heartburn/reflux. 60 capsule 3   No current facility-administered medications for this visit.    Allergies as of 06/24/2023   (No Known Allergies)    Family History  Problem Relation Age of Onset   Heart disease Mother    Diabetes Mother    Heart disease Father    Hypertension Father    Thyroid disease Father    Hyperlipidemia Father     Breast cancer Paternal Grandmother    Breast cancer Paternal Aunt    Cancer - Colon Paternal Uncle    Colon cancer Neg Hx     Social History   Socioeconomic History   Marital status: Married    Spouse name: Not on file   Number of children: 2   Years of education: Not on file   Highest education level: Not on file  Occupational History   Not on file  Tobacco Use   Smoking status: Former    Current packs/day: 0.00    Average packs/day: 0.5 packs/day for 15.0 years (7.5 ttl pk-yrs)    Types: Cigarettes    Start date: 11/13/2004    Quit date: 11/14/2019    Years since quitting: 3.6   Smokeless tobacco: Never  Vaping Use  Vaping status: Never Used  Substance and Sexual Activity   Alcohol use: No   Drug use: No   Sexual activity: Yes    Birth control/protection: Surgical, Pill  Other Topics Concern   Not on file  Social History Narrative   Lives with her spouse and children.    Social Determinants of Health   Financial Resource Strain: Low Risk  (03/14/2022)   Overall Financial Resource Strain (CARDIA)    Difficulty of Paying Living Expenses: Not hard at all  Food Insecurity: No Food Insecurity (03/14/2022)   Hunger Vital Sign    Worried About Running Out of Food in the Last Year: Never true    Ran Out of Food in the Last Year: Never true  Transportation Needs: No Transportation Needs (03/14/2022)   PRAPARE - Administrator, Civil Service (Medical): No    Lack of Transportation (Non-Medical): No  Physical Activity: Insufficiently Active (03/14/2022)   Exercise Vital Sign    Days of Exercise per Week: 5 days    Minutes of Exercise per Session: 20 min  Stress: No Stress Concern Present (03/14/2022)   [employer] of Occupational Health - Occupational Stress Questionnaire    Feeling of Stress : Only a little  Social Connections: Moderately Isolated (03/14/2022)   Social Connection and Isolation Panel [NHANES]    Frequency of Communication with Friends and  Family: Twice a week    Frequency of Social Gatherings with Friends and Family: Never    Attends Religious Services: 1 to 4 times per year    Active Member of [employer] or Organizations: No    Attends Engineer, structural: Never    Marital Status: Married    Review of Systems: Gen: Denies fever, chills, cold or flulike symptoms, presyncope, syncope. CV: Denies chest pain, palpitations. Resp: Denies dyspnea, cough. GI: See HPI Heme: See HPI  Physical Exam: BP 125/78 (BP Location: Right Arm, Patient Position: Sitting, Cuff Size: Large)   Pulse 76   Temp 98.1 F (36.7 C) (Temporal)   Ht 5\' 5"  (1.651 m)   Wt 262 lb 9.6 oz (119.1 kg)   SpO2 97%   BMI 43.70 kg/m  General:   Alert and oriented. No distress noted. Pleasant and cooperative.  Head:  Normocephalic and atraumatic. Eyes:  Conjuctiva clear without scleral icterus. Heart:  S1, S2 present without murmurs appreciated. Lungs:  Clear to auscultation bilaterally. No wheezes, rales, or rhonchi. No distress.  Abdomen:  +BS, soft, non-tender and non-distended. No rebound or guarding. No HSM or masses noted. Msk:  Symmetrical without gross deformities. Normal posture. Extremities:  Without edema. Neurologic:  Alert and  oriented x4 Psych:  Normal mood and affect.    Assessment:  42 year old female presenting today for follow-up of IDA, GERD, nausea, weight loss, constipation.  IDA: He was noted decline in hemoglobin from 12 range to 9.5 in May 2024 with ferritin of 4.  She was started on oral iron at that time and hemoglobin has improved to 12.7 on 04/27/2023.  She has denied overt GI bleeding or vaginal bleeding.  In fact, she does does not have a menstrual cycle as she is on birth control.  EGD and colonoscopy completed 04/02/2023 with 3 tubular adenomas removed from the colon gastritis noted on EGD.  PPI was increased to twice daily at that time.  Notably, she had been taking meloxicam twice a week for hip, but has decreased  frequency of use.  At this point, etiology of IDA  has not been identified.  We will proceed with givens capsule to complete GI evaluation.  She will continue on oral iron for now. If givens capsule is unrevealing, we will have her stop iron and monitor hemoglobin.  If hemoglobin declines, recommend hematology referral.  GERD: Well-controlled on omeprazole 40 mg twice daily.  Will try decreasing to once daily, but advised to resume twice daily dosing if she has breakthrough symptoms 2-3 times a week.  GERD diet was also reinforced.  Weight loss/nausea without vomiting: Previously with weight loss in the setting of nausea without vomiting.  This has resolved.  Symptoms likely secondary to gastritis/uncontrolled GERD.  Chronic idiopathic constipation: Improved with Amitiza 8 mcg twice daily, but still not adequately managed.  Will increase Amitiza to 24 mcg twice daily.   Plan:  Givens capsule  Hold iron x 10 days prior. Decrease omeprazole to 40 mg once daily.  May resume twice daily if breakthrough symptoms 2-3 times per week. Counseled on GERD diet/lifestyle.  Written instructions were provided on AVS. Increase Amitiza to 24 mcg twice daily with meals. Continue to limit meloxicam is much as possible and avoid all other NSAIDs. Follow-up in 3 months or sooner if needed.   Ermalinda Memos, PA-C [facility] 06/24/2023

## 2023-06-24 ENCOUNTER — Encounter: Payer: Self-pay | Admitting: Gastroenterology

## 2023-06-24 ENCOUNTER — Encounter: Payer: Self-pay | Admitting: *Deleted

## 2023-06-24 ENCOUNTER — Ambulatory Visit (INDEPENDENT_AMBULATORY_CARE_PROVIDER_SITE_OTHER): Payer: 59 | Admitting: Gastroenterology

## 2023-06-24 VITALS — BP 125/78 | HR 76 | Temp 98.1°F | Ht 65.0 in | Wt 262.6 lb

## 2023-06-24 DIAGNOSIS — K219 Gastro-esophageal reflux disease without esophagitis: Secondary | ICD-10-CM

## 2023-06-24 DIAGNOSIS — K5904 Chronic idiopathic constipation: Secondary | ICD-10-CM

## 2023-06-24 DIAGNOSIS — D509 Iron deficiency anemia, unspecified: Secondary | ICD-10-CM | POA: Diagnosis not present

## 2023-06-24 DIAGNOSIS — R110 Nausea: Secondary | ICD-10-CM

## 2023-06-24 DIAGNOSIS — R634 Abnormal weight loss: Secondary | ICD-10-CM | POA: Diagnosis not present

## 2023-06-24 MED ORDER — OMEPRAZOLE 40 MG PO CPDR
DELAYED_RELEASE_CAPSULE | ORAL | 3 refills | Status: DC
Start: 2023-06-24 — End: 2024-04-19

## 2023-06-24 MED ORDER — LUBIPROSTONE 24 MCG PO CAPS
24.0000 ug | ORAL_CAPSULE | Freq: Two times a day (BID) | ORAL | 3 refills | Status: DC
Start: 2023-06-24 — End: 2023-12-14

## 2023-06-24 NOTE — Patient Instructions (Addendum)
I am glad you are much better overall!  I would like for you to try decreasing omeprazole to 40 mg once daily 30 minutes before breakfast.  It will be important that you avoid common reflux triggers as described below.  If you start to have breakthrough heartburn symptoms 2-3 times a week despite avoiding trigger foods, resume taking omeprazole twice daily.  If needed, you could try taking a second dose of omeprazole before consuming an occasional trigger food, but you should limit these overall.  GERD diet/lifestyle recommendations: Avoid fried, fatty, greasy, spicy, citrus foods. Avoid caffeine and carbonated beverages. Avoid chocolate. Try eating 4-6 small meals a day rather than 3 large meals. Do not eat within 3 hours of laying down. Prop head of bed up on wood or bricks to create a 6 inch incline.  For constipation: We are going to increase Amitiza to 24 mcg twice daily.  I have sent a new prescription to your pharmacy.  For iron deficiency anemia: We will arrange you to have a givens capsule study to evaluate your small bowel. You will need to hold iron for 10 days prior to this procedure.  I will plan to see back in the office in about 3 months or sooner if needed.  It was great to see you again today!  Ermalinda Memos, PA-C [facility]enter[facility]

## 2023-06-25 ENCOUNTER — Other Ambulatory Visit: Payer: Self-pay | Admitting: Obstetrics & Gynecology

## 2023-06-25 ENCOUNTER — Other Ambulatory Visit: Payer: Self-pay | Admitting: Internal Medicine

## 2023-06-25 DIAGNOSIS — E119 Type 2 diabetes mellitus without complications: Secondary | ICD-10-CM

## 2023-07-02 ENCOUNTER — Other Ambulatory Visit: Payer: Self-pay | Admitting: Internal Medicine

## 2023-07-02 DIAGNOSIS — E559 Vitamin D deficiency, unspecified: Secondary | ICD-10-CM

## 2023-07-06 ENCOUNTER — Other Ambulatory Visit: Payer: Self-pay | Admitting: Internal Medicine

## 2023-07-08 ENCOUNTER — Ambulatory Visit (HOSPITAL_COMMUNITY)
Admission: RE | Admit: 2023-07-08 | Discharge: 2023-07-08 | Disposition: A | Discharge status: Home / Self Care | Payer: 59 | Attending: Internal Medicine | Admitting: Internal Medicine

## 2023-07-08 ENCOUNTER — Encounter (HOSPITAL_COMMUNITY)
Admission: RE | Disposition: A | Discharge status: Home / Self Care | Payer: Self-pay | Source: Home / Self Care | Attending: Internal Medicine

## 2023-07-08 DIAGNOSIS — Z9889 Other specified postprocedural states: Secondary | ICD-10-CM

## 2023-07-08 DIAGNOSIS — Z7985 Long-term (current) use of injectable non-insulin antidiabetic drugs: Secondary | ICD-10-CM | POA: Diagnosis not present

## 2023-07-08 DIAGNOSIS — Z86010 Personal history of colonic polyps: Secondary | ICD-10-CM | POA: Diagnosis not present

## 2023-07-08 DIAGNOSIS — D509 Iron deficiency anemia, unspecified: Secondary | ICD-10-CM | POA: Insufficient documentation

## 2023-07-08 DIAGNOSIS — Z79899 Other long term (current) drug therapy: Secondary | ICD-10-CM | POA: Diagnosis not present

## 2023-07-08 DIAGNOSIS — Z91148 Patient's other noncompliance with medication regimen for other reason: Secondary | ICD-10-CM

## 2023-07-08 DIAGNOSIS — K3184 Gastroparesis: Secondary | ICD-10-CM | POA: Diagnosis not present

## 2023-07-08 HISTORY — PX: GIVENS CAPSULE STUDY: SHX5432

## 2023-07-08 SURGERY — IMAGING PROCEDURE, GI TRACT, INTRALUMINAL, VIA CAPSULE

## 2023-07-09 NOTE — H&P (Signed)
Patient presenting for capsule endoscopy due to to History of Iron Deficiency Anemia

## 2023-07-13 ENCOUNTER — Ambulatory Visit (HOSPITAL_COMMUNITY)
Admission: RE | Admit: 2023-07-13 | Discharge: 2023-07-13 | Disposition: A | Discharge status: Home / Self Care | Payer: 59 | Source: Ambulatory Visit | Attending: Gastroenterology | Admitting: Gastroenterology

## 2023-07-13 ENCOUNTER — Other Ambulatory Visit: Payer: Self-pay | Admitting: Gastroenterology

## 2023-07-13 DIAGNOSIS — Z189 Retained foreign body fragments, unspecified material: Secondary | ICD-10-CM | POA: Insufficient documentation

## 2023-07-13 DIAGNOSIS — I878 Other specified disorders of veins: Secondary | ICD-10-CM | POA: Diagnosis not present

## 2023-07-13 NOTE — Op Note (Signed)
Small Bowel Givens Capsule Study Procedure date:  07/08/23  Referring Provider:  Ermalinda Memos, PA-C PCP:  Dr. Durwin Nora, Lucina Mellow, MD  Indication for procedure:  IDA, now resolved with iron supplementation. Prior EGD and colonoscopy 04/02/2023 with 3 tubular adenomas removed from the colon gastritis noted on EGD. PPI was increased to twice daily at that time. History of meloxicam use. Now completing Given's Capsule to complete GI evaluation.   Patient data:  Wt: 119.1 kg Ht: 5\' 5"    Findings:   Normal appearing gastric mucosa.Slow gastric emptying. Few areas of punctate petechiations within the small bowel with examples captured at 4:45:23, 05:52:28. Possible small erosion at 05:31:25. No active bleeding. No masses or other concerning findings. Study was not complete to the cecum, likely influenced by Trulicity.    First Gastric image:  00:00:49 First Duodenal image: 04:03:45 (capsule was transitioning between 04:00:06 - 04:03:45) First Cecal image: N/A. Capsule did not make it through the small bowel.  Gastric Passage time: 4h 52m Small Bowel Passage time:  Capsule in the small bowel for 3 hr 52 min 16 seconds, but did not leave small bowel.   Summary: - Normal gastric mucosa. ' - Slow gastric emptying, likely secondary to Trulicity.  - Few scattered areas of small bowel petechiations.  - One possible small, small bowel erosion.  - Overall, reassuring study, but unfortunately incomplete. Suspect petechiations/small erosion likely secondary to history of meloxicam use. This will likely continue to improve with NSAID avoidance.     Recommendations:  Abdominal x ray to verify capsule has passed.  Considered repeating capsule endoscopy holding Trulicity providing that capsule has passed vs stopping iron and monitoring for recurrent IDA. Spoke with patient who prefers to stop iron and monitor.  Continue omeprazole 40 mg daily.  Avoid NSAIDs.  Follow-up in December as already scheduled.  Will repeat CBC at that time. Advised patient to contact me if any overt GI bleeding.    Ermalinda Memos, PA-C [facility] Gastroenter[facility]

## 2023-07-13 NOTE — Progress Notes (Signed)
error 

## 2023-07-14 ENCOUNTER — Encounter (HOSPITAL_COMMUNITY): Payer: Self-pay | Admitting: Internal Medicine

## 2023-07-18 ENCOUNTER — Other Ambulatory Visit: Payer: Self-pay | Admitting: Internal Medicine

## 2023-07-18 DIAGNOSIS — E119 Type 2 diabetes mellitus without complications: Secondary | ICD-10-CM

## 2023-07-21 ENCOUNTER — Other Ambulatory Visit: Payer: Self-pay | Admitting: Internal Medicine

## 2023-07-21 DIAGNOSIS — F419 Anxiety disorder, unspecified: Secondary | ICD-10-CM

## 2023-07-24 ENCOUNTER — Other Ambulatory Visit: Payer: Self-pay | Admitting: Internal Medicine

## 2023-08-02 ENCOUNTER — Other Ambulatory Visit: Payer: Self-pay | Admitting: Gastroenterology

## 2023-08-03 ENCOUNTER — Other Ambulatory Visit: Payer: Self-pay | Admitting: Internal Medicine

## 2023-08-04 ENCOUNTER — Other Ambulatory Visit: Payer: Self-pay | Admitting: Internal Medicine

## 2023-08-11 ENCOUNTER — Other Ambulatory Visit: Payer: Self-pay | Admitting: Internal Medicine

## 2023-08-11 DIAGNOSIS — E119 Type 2 diabetes mellitus without complications: Secondary | ICD-10-CM

## 2023-08-16 ENCOUNTER — Other Ambulatory Visit: Payer: Self-pay | Admitting: Internal Medicine

## 2023-08-16 DIAGNOSIS — F419 Anxiety disorder, unspecified: Secondary | ICD-10-CM

## 2023-08-19 ENCOUNTER — Telehealth: Payer: Self-pay

## 2023-08-19 NOTE — Telephone Encounter (Signed)
PA done for Omeprazole 40mg  DR cap. Dx used: K21.9 (GERD) and K29.7 (Gastritis). Tried/failed: pantoprazole. Pt made aware of this in her MyChart

## 2023-08-22 ENCOUNTER — Other Ambulatory Visit: Payer: Self-pay | Admitting: Internal Medicine

## 2023-08-27 ENCOUNTER — Other Ambulatory Visit: Payer: Self-pay | Admitting: Internal Medicine

## 2023-09-06 ENCOUNTER — Other Ambulatory Visit: Payer: Self-pay | Admitting: Internal Medicine

## 2023-09-06 DIAGNOSIS — E119 Type 2 diabetes mellitus without complications: Secondary | ICD-10-CM

## 2023-09-08 ENCOUNTER — Ambulatory Visit: Payer: 59

## 2023-09-08 ENCOUNTER — Telehealth: Payer: Self-pay

## 2023-09-08 ENCOUNTER — Encounter: Payer: Self-pay | Admitting: Emergency Medicine

## 2023-09-08 ENCOUNTER — Ambulatory Visit
Admission: EM | Admit: 2023-09-08 | Discharge: 2023-09-08 | Disposition: A | Discharge status: Home / Self Care | Payer: 59 | Attending: Family Medicine | Admitting: Family Medicine

## 2023-09-08 ENCOUNTER — Other Ambulatory Visit: Payer: Self-pay

## 2023-09-08 DIAGNOSIS — R058 Other specified cough: Secondary | ICD-10-CM | POA: Diagnosis not present

## 2023-09-08 DIAGNOSIS — R0989 Other specified symptoms and signs involving the circulatory and respiratory systems: Secondary | ICD-10-CM | POA: Diagnosis not present

## 2023-09-08 DIAGNOSIS — J069 Acute upper respiratory infection, unspecified: Secondary | ICD-10-CM

## 2023-09-08 DIAGNOSIS — R531 Weakness: Secondary | ICD-10-CM | POA: Diagnosis not present

## 2023-09-08 LAB — POC COVID19/FLU A&B COMBO
Covid Antigen, POC: NEGATIVE
Influenza A Antigen, POC: NEGATIVE
Influenza B Antigen, POC: NEGATIVE

## 2023-09-08 MED ORDER — PROMETHAZINE-DM 6.25-15 MG/5ML PO SYRP: 5.0000 mL | ORAL_SOLUTION | Freq: Four times a day (QID) | ORAL | 0 refills | Status: DC | PRN

## 2023-09-08 MED ORDER — PREDNISONE 20 MG PO TABS: 40.0000 mg | ORAL_TABLET | Freq: Every day | ORAL | 0 refills | Status: DC

## 2023-09-08 NOTE — Discharge Instructions (Signed)
We will call with your chest x-ray results and discuss a treatment plan from there

## 2023-09-08 NOTE — Telephone Encounter (Signed)
Contacted pt to inform her of chest x-ray results, and mediations sent to [facility] pharmacy pt has verbalized understanding.

## 2023-09-08 NOTE — ED Triage Notes (Addendum)
Pt reports cough, runny nose, headache, generalized weakness since Friday. Denies any known fevers.

## 2023-09-08 NOTE — ED Provider Notes (Signed)
[facility]    CSN: [id number] Arrival date & time: 09/08/23  1006      History   Chief Complaint Chief Complaint  Patient presents with   Cough    HPI [name] is a 42 y.o. female.   Patient presenting today with 5-day history of cough, congestion, headache, weakness, body aches.  Denies fever, chest pain, shortness of breath, abdominal pain, nausea vomiting or diarrhea.  So far trying over-the-counter remedies with minimal relief.  No known sick contacts recently.    Past Medical History:  Diagnosis Date   Anxiety    Arthritis    Depression    Diabetes (HCC)    GERD (gastroesophageal reflux disease)    Hypercholesteremia    Sinus infection 03/27/2021    Patient Active Problem List   Diagnosis Date Noted   Dysphagia 03/05/2023   Gastroesophageal reflux disease 03/05/2023   Change in bowel habits 03/05/2023   Nausea without vomiting 03/05/2023   Loss of weight 03/05/2023   Iron deficiency anemia 03/04/2023   Diarrhea 02/18/2023   Abdominal pain, RUQ 02/18/2023   H/O seasonal allergies 08/25/2022   Type 2 diabetes mellitus without complications (HCC) 07/22/2022   Need for Tdap vaccination 07/22/2022   Menorrhagia with regular cycle    Vitamin D deficiency 02/18/2022   Annual physical exam 02/18/2022   Prediabetes 02/18/2022   Refused influenza vaccine 12/17/2021   Hot flashes 12/17/2021   Bloody discharge from left nipple 03/21/2021   Right hip and buttock pain, ishial bursitis 07/20/2020   Anxiety 03/29/2019   Contact dermatitis 03/29/2019   Dysmenorrhea 06/30/2018   Hyperlipidemia 09/27/2015   Obesity, unspecified 09/27/2015   Cigarette nicotine dependence, uncomplicated 09/27/2015   Cholecystitis with cholelithiasis 07/13/2014    Past Surgical History:  Procedure Laterality Date   BIOPSY  04/02/2023   Procedure: BIOPSY;  Surgeon: [name], DO;  [facility];  Service: Endoscopy;;   BREAST BIOPSY Left 04/03/2021    Procedure: BREAST BIOPSY; excision of duct lesion;  Surgeon: Lucretia Roers, MD;  Location: [facility];  Service: General;  Laterality: Left;   CHOLECYSTECTOMY N/A 07/13/2014   Procedure: LAPAROSCOPIC CHOLECYSTECTOMY;  Surgeon: Marlane Hatcher, MD;  Location: [facility];  Service: General;  Laterality: N/A;   COLONOSCOPY WITH PROPOFOL N/A 04/02/2023   Procedure: COLONOSCOPY WITH PROPOFOL;  Surgeon: [name], DO;  [facility];  Service: Endoscopy;  Laterality: N/A;  8:15 am, asa 3, pt knows to arrive at 6:00   DENTAL SURGERY  2018   plate top & bottom   DILATATION AND CURETTAGE/HYSTEROSCOPY WITH MINERVA N/A 04/16/2022   Procedure: DILATATION AND CURETTAGE/HYSTEROSCOPY WITH MINERVA;  Surgeon: Lazaro Arms, MD;  Location: [facility];  Service: Gynecology;  Laterality: N/A;   ESOPHAGOGASTRODUODENOSCOPY (EGD) WITH PROPOFOL N/A 04/02/2023   Procedure: ESOPHAGOGASTRODUODENOSCOPY (EGD) WITH PROPOFOL;  Surgeon: [name], DO;  [facility];  Service: Endoscopy;  Laterality: N/A;   GIVENS CAPSULE STUDY N/A 07/08/2023   Procedure: GIVENS CAPSULE STUDY;  Surgeon: [name], DO;  [facility];  Service: Endoscopy;  Laterality: N/A;  830am   POLYPECTOMY  04/02/2023   Procedure: POLYPECTOMY;  Surgeon: [name], DO;  [facility];  Service: Endoscopy;;   TUBAL LIGATION      OB History     Gravida  2   Para  2   Term      Preterm  AB      Living  2      SAB      IAB      Ectopic      Multiple      Live Births               Home Medications    Prior to Admission medications   Medication Sig Start Date End Date Taking? Authorizing Provider  predniSONE (DELTASONE) 20 MG tablet Take 2 tablets (40 mg total) by mouth daily with breakfast. 09/08/23  Yes Particia Nearing, PA-C  promethazine-dextromethorphan (PROMETHAZINE-DM) 6.25-15 MG/5ML syrup Take 5 mLs by mouth 4 (four) times daily as needed. 09/08/23   Yes Particia Nearing, PA-C  citalopram (CELEXA) 40 MG tablet Take 1 tablet by mouth once daily 08/17/23   Billie Lade, MD  fenofibrate (TRICOR) 145 MG tablet Take 1 tablet by mouth once daily 08/27/23   Billie Lade, MD  Ferrous Sulfate (IRON) 325 (65 Fe) MG TABS Take 325 mg by mouth daily.    [provider]  fluticasone (FLONASE) 50 MCG/ACT nasal spray Place 2 sprays into both nostrils daily. 01/19/23   Billie Lade, MD  ketorolac (TORADOL) 10 MG tablet TAKE 1 TABLET BY MOUTH EVERY 8 HOURS AS NEEDED 06/26/23   Lazaro Arms, MD  lisinopril (ZESTRIL) 5 MG tablet Take 1 tablet (5 mg total) by mouth daily. 04/27/23   Billie Lade, MD  lubiprostone (AMITIZA) 24 MCG capsule Take 1 capsule (24 mcg total) by mouth 2 (two) times daily with a meal. 06/24/23   Letta Median, PA-C  megestrol (MEGACE) 40 MG tablet Take 1 tablet by mouth once daily 08/04/23   Billie Lade, MD  meloxicam (MOBIC) 15 MG tablet TAKE 1 TABLET BY MOUTH ONCE DAILY AS NEEDED FOR PAIN 08/24/23   Billie Lade, MD  metFORMIN (GLUCOPHAGE-XR) 500 MG 24 hr tablet Take 1 tablet by mouth once daily with breakfast 03/03/23   Billie Lade, MD  norethindrone (MICRONOR) 0.35 MG tablet Take 1 tablet by mouth once daily 04/15/23   Lazaro Arms, MD  omeprazole (PRILOSEC) 40 MG capsule Take 1 capsule (40 mg total) by mouth 1-2 times daily before meals for heartburn/reflux. 06/24/23   Letta Median, PA-C  simvastatin (ZOCOR) 40 MG tablet TAKE 1 TABLET BY MOUTH AT BEDTIME 08/17/23   Billie Lade, MD  TRULICITY 0.75 MG/0.5ML SOAJ INJECT 0.75 MG INTO THE SKIN ONCE A WEEK 09/07/23   Billie Lade, MD  Vitamin D, Ergocalciferol, (DRISDOL) 1.25 MG (50000 UNIT) CAPS capsule Take 1 capsule by mouth once a week 05/11/23   Billie Lade, MD    Family History Family History  Problem Relation Age of Onset   Heart disease Mother    Diabetes Mother    Heart disease Father    Hypertension Father    Thyroid  disease Father    Hyperlipidemia Father    Breast cancer Paternal Grandmother    Breast cancer Paternal Aunt    Cancer - Colon Paternal Uncle    Colon cancer Neg Hx     Social History Social History   Tobacco Use   Smoking status: Former    Current packs/day: 0.00    Average packs/day: 0.5 packs/day for 15.0 years (7.5 ttl pk-yrs)    Types: Cigarettes    Start date: 11/13/2004    Quit date: 11/14/2019    Years since quitting: 3.8   Smokeless tobacco:  Never  Vaping Use   Vaping status: Never Used  Substance Use Topics   Alcohol use: No   Drug use: No     Allergies   Patient has no known allergies.   Review of Systems Review of Systems Per HPI  Physical Exam Triage Vital Signs ED Triage Vitals  Encounter Vitals Group     BP 09/08/23 1049 118/79     Systolic BP Percentile --      Diastolic BP Percentile --      Pulse Rate 09/08/23 1049 83     Resp 09/08/23 1049 20     Temp 09/08/23 1049 98.4 F (36.9 C)     Temp Source 09/08/23 1049 Oral     SpO2 09/08/23 1049 95 %     Weight --      Height --      Head Circumference --      Peak Flow --      Pain Score 09/08/23 1048 8     Pain Loc --      Pain Education --      Exclude from Growth Chart --    No data found.  Updated Vital Signs BP 118/79 (BP Location: Right Arm)   Pulse 83   Temp 98.4 F (36.9 C) (Oral)   Resp 20   SpO2 95%   Visual Acuity Right Eye Distance:   Left Eye Distance:   Bilateral Distance:    Right Eye Near:   Left Eye Near:    Bilateral Near:     Physical Exam Vitals and nursing note reviewed.  Constitutional:      Appearance: Normal appearance.  HENT:     Head: Atraumatic.     Right Ear: Tympanic membrane and external ear normal.     Left Ear: Tympanic membrane and external ear normal.     Nose: Rhinorrhea present.     Mouth/Throat:     Mouth: Mucous membranes are moist.     Pharynx: Posterior oropharyngeal erythema present.  Eyes:     Extraocular Movements: Extraocular  movements intact.     Conjunctiva/sclera: Conjunctivae normal.  Cardiovascular:     Rate and Rhythm: Normal rate and regular rhythm.     Heart sounds: Normal heart sounds.  Pulmonary:     Effort: Pulmonary effort is normal.     Breath sounds: Wheezing present. No rales.     Comments: Trace wheezes throughout Musculoskeletal:        General: Normal range of motion.     Cervical back: Normal range of motion and neck supple.  Skin:    General: Skin is warm and dry.  Neurological:     Mental Status: She is alert and oriented to person, place, and time.  Psychiatric:        Mood and Affect: Mood normal.        Thought Content: Thought content normal.      UC Treatments / Results  Labs (all labs ordered are listed, but only abnormal results are displayed) Labs Reviewed  POC COVID19/FLU A&B COMBO    EKG   Radiology DG Chest 2 View  Result Date: 09/08/2023 CLINICAL DATA:  Productive cough for 5 days. Weakness and body aches. Congestion EXAM: CHEST - 2 VIEW COMPARISON:  X-ray 12/06/2018 FINDINGS: Underinflation. No consolidation, pneumothorax or effusion. No edema. Normal cardiopericardial silhouette without edema. Mild degenerative changes of the spine on lateral view. IMPRESSION: Underinflation.  No acute cardiopulmonary disease. Electronically Signed   By: Scarlette Shorts  Chales Abrahams M.D.   On: 09/08/2023 12:04    Procedures Procedures (including critical care time)  Medications Ordered in UC Medications - No data to display  Initial Impression / Assessment and Plan / UC Course  I have reviewed the triage vital signs and the nursing notes.  Pertinent labs & imaging results that were available during my care of the patient were reviewed by me and considered in my medical decision making (see chart for details).     Flu and COVID-negative, chest x-ray negative for pneumonia.  Will treat for viral bronchitis with prednisone, Phenergan DM, support over-the-counter medications and home  care.  Return for worsening symptoms.  Final Clinical Impressions(s) / UC Diagnoses   Final diagnoses:  Viral URI with cough     Discharge Instructions      We will call with your chest x-ray results and discuss a treatment plan from there    ED Prescriptions     Medication Sig Dispense Auth. Provider   predniSONE (DELTASONE) 20 MG tablet Take 2 tablets (40 mg total) by mouth daily with breakfast. 10 tablet Particia Nearing, PA-C   promethazine-dextromethorphan (PROMETHAZINE-DM) 6.25-15 MG/5ML syrup Take 5 mLs by mouth 4 (four) times daily as needed. 100 mL Particia Nearing, [place]      PDMP not reviewed this encounter.   Particia Nearing, [place] 09/08/23 1214

## 2023-09-11 ENCOUNTER — Other Ambulatory Visit: Payer: Self-pay | Admitting: Internal Medicine

## 2023-09-11 DIAGNOSIS — F419 Anxiety disorder, unspecified: Secondary | ICD-10-CM

## 2023-09-20 ENCOUNTER — Other Ambulatory Visit: Payer: Self-pay | Admitting: Internal Medicine

## 2023-09-22 NOTE — Progress Notes (Unsigned)
Referring Provider: Billie Lade, MD Primary Care Physician:  Billie Lade, MD Primary GI Physician: Dr. Marletta Lor   No chief complaint on file.   HPI:   [name] is a 42 y.o. female with history of GERD, constipation, IDA, presenting today for follow-up of IDA and constipation.   Colonoscopy 04/02/23: Nonbleeding internal hemorrhoids diverticulosis in the sigmoid and transverse colon, three 4-6 mm polyps resected and retrieved.  Recommended repeat colonoscopy in 5 years.  Pathology with tubular adenoma   EGD: 04/02/23 Normal esophagus, gastritis biopsied, normal examined duodenum.  Recommended PPI twice daily. Pathology was benign, no H. pylori.   Last seen in the office 06/24/23. GERD well controlled on omeprazole 40 mg twice daily and prior weight loss and nausea resolved which was suspected to be secondary to gastritis/uncontrolled GERD. Advised she could try decreasing omeprazole to once daily. Chronic constipation improved with Amitiza 8 mcg BID, but not adequately managed. Recommended increasing Amitiza to 24 mcg BID. Regarding IDA, she had no overt GI bleeding and was limiting meloxicam. As we had no identified the cause of IDA, recommended completing givens capsule.   Givens 07/08/23:  - Normal gastric mucosa. ' - Slow gastric emptying, likely secondary to Trulicity.  - Few scattered areas of small bowel petechiations.  - One possible small, small bowel erosion.  - Overall, reassuring study, but unfortunately incomplete. Suspect petechiations/small erosion likely secondary to history of meloxicam use. This will likely continue to improve with NSAID avoidance.  Recommendations:  Abdominal x ray to verify capsule has passed.  Considered repeating capsule endoscopy holding Trulicity providing that capsule has passed vs stopping iron and monitoring for recurrent IDA. Spoke with patient who prefers to stop iron and monitor.   Follow-up in December as already scheduled. Will  repeat CBC at that time.   X-ray was completed showing capsule in area of the cecum.   Today:  IDA:   Constipation:    Past Medical History:  Diagnosis Date   Anxiety    Arthritis    Depression    Diabetes (HCC)    GERD (gastroesophageal reflux disease)    Hypercholesteremia    Sinus infection 03/27/2021    Past Surgical History:  Procedure Laterality Date   BIOPSY  04/02/2023   Procedure: BIOPSY;  Surgeon: [name], DO;  [facility];  Service: Endoscopy;;   BREAST BIOPSY Left 04/03/2021   Procedure: BREAST BIOPSY; excision of duct lesion;  Surgeon: Lucretia Roers, MD;  Location: [facility];  Service: General;  Laterality: Left;   CHOLECYSTECTOMY N/A 07/13/2014   Procedure: LAPAROSCOPIC CHOLECYSTECTOMY;  Surgeon: Marlane Hatcher, MD;  Location: [facility];  Service: General;  Laterality: N/A;   COLONOSCOPY WITH PROPOFOL N/A 04/02/2023   Procedure: COLONOSCOPY WITH PROPOFOL;  Surgeon: [name], DO;  [facility];  Service: Endoscopy;  Laterality: N/A;  8:15 am, asa 3, pt knows to arrive at 6:00   DENTAL SURGERY  2018   plate top & bottom   DILATATION AND CURETTAGE/HYSTEROSCOPY WITH MINERVA N/A 04/16/2022   Procedure: DILATATION AND CURETTAGE/HYSTEROSCOPY WITH MINERVA;  Surgeon: Lazaro Arms, MD;  Location: [facility];  Service: Gynecology;  Laterality: N/A;   ESOPHAGOGASTRODUODENOSCOPY (EGD) WITH PROPOFOL N/A 04/02/2023   Procedure: ESOPHAGOGASTRODUODENOSCOPY (EGD) WITH PROPOFOL;  Surgeon: [name], DO;  [facility];  Service: Endoscopy;  Laterality: N/A;   GIVENS CAPSULE STUDY N/A 07/08/2023   Procedure: GIVENS CAPSULE STUDY;  Surgeon: Marletta Lor,  Hennie Duos, DO;  [facility];  Service: Endoscopy;  Laterality: N/A;  830am   POLYPECTOMY  04/02/2023   Procedure: POLYPECTOMY;  Surgeon: [name], DO;  [facility];  Service: Endoscopy;;   TUBAL LIGATION      Current Outpatient Medications   Medication Sig Dispense Refill   citalopram (CELEXA) 40 MG tablet Take 1 tablet by mouth once daily 30 tablet 0   fenofibrate (TRICOR) 145 MG tablet Take 1 tablet by mouth once daily 30 tablet 0   Ferrous Sulfate (IRON) 325 (65 Fe) MG TABS Take 325 mg by mouth daily.     fluticasone (FLONASE) 50 MCG/ACT nasal spray Place 2 sprays into both nostrils daily. 16 g 6   ketorolac (TORADOL) 10 MG tablet TAKE 1 TABLET BY MOUTH EVERY 8 HOURS AS NEEDED 15 tablet 0   lisinopril (ZESTRIL) 5 MG tablet Take 1 tablet (5 mg total) by mouth daily. 90 tablet 3   lubiprostone (AMITIZA) 24 MCG capsule Take 1 capsule (24 mcg total) by mouth 2 (two) times daily with a meal. 60 capsule 3   megestrol (MEGACE) 40 MG tablet Take 1 tablet by mouth once daily 30 tablet 0   meloxicam (MOBIC) 15 MG tablet TAKE 1 TABLET BY MOUTH ONCE DAILY AS NEEDED FOR PAIN 30 tablet 0   metFORMIN (GLUCOPHAGE-XR) 500 MG 24 hr tablet Take 1 tablet by mouth once daily with breakfast 30 tablet 0   norethindrone (MICRONOR) 0.35 MG tablet Take 1 tablet by mouth once daily 84 tablet 3   omeprazole (PRILOSEC) 40 MG capsule Take 1 capsule (40 mg total) by mouth 1-2 times daily before meals for heartburn/reflux. 60 capsule 3   predniSONE (DELTASONE) 20 MG tablet Take 2 tablets (40 mg total) by mouth daily with breakfast. 10 tablet 0   promethazine-dextromethorphan (PROMETHAZINE-DM) 6.25-15 MG/5ML syrup Take 5 mLs by mouth 4 (four) times daily as needed. 100 mL 0   simvastatin (ZOCOR) 40 MG tablet TAKE 1 TABLET BY MOUTH AT BEDTIME 90 tablet 0   TRULICITY 0.75 MG/0.5ML SOAJ INJECT 0.75 MG INTO THE SKIN ONCE A WEEK 4 mL 0   Vitamin D, Ergocalciferol, (DRISDOL) 1.25 MG (50000 UNIT) CAPS capsule Take 1 capsule by mouth once a week 8 capsule 0   No current facility-administered medications for this visit.    Allergies as of 09/24/2023   (No Known Allergies)    Family History  Problem Relation Age of Onset   Heart disease Mother    Diabetes Mother     Heart disease Father    Hypertension Father    Thyroid disease Father    Hyperlipidemia Father    Breast cancer Paternal Grandmother    Breast cancer Paternal Aunt    Cancer - Colon Paternal Uncle    Colon cancer Neg Hx     Social History   Socioeconomic History   Marital status: Married    Spouse name: Not on file   Number of children: 2   Years of education: Not on file   Highest education level: Not on file  Occupational History   Not on file  Tobacco Use   Smoking status: Former    Current packs/day: 0.00    Average packs/day: 0.5 packs/day for 15.0 years (7.5 ttl pk-yrs)    Types: Cigarettes    Start date: 11/13/2004    Quit date: 11/14/2019    Years since quitting: 3.8   Smokeless tobacco: Never  Vaping Use  Vaping status: Never Used  Substance and Sexual Activity   Alcohol use: No   Drug use: No   Sexual activity: Yes    Birth control/protection: Surgical, Pill  Other Topics Concern   Not on file  Social History Narrative   Lives with her spouse and children.    Social Determinants of Health   Financial Resource Strain: Low Risk  (03/14/2022)   Overall Financial Resource Strain (CARDIA)    Difficulty of Paying Living Expenses: Not hard at all  Food Insecurity: No Food Insecurity (03/14/2022)   Hunger Vital Sign    Worried About Running Out of Food in the Last Year: Never true    Ran Out of Food in the Last Year: Never true  Transportation Needs: No Transportation Needs (03/14/2022)   PRAPARE - Administrator, Civil Service (Medical): No    Lack of Transportation (Non-Medical): No  Physical Activity: Insufficiently Active (03/14/2022)   Exercise Vital Sign    Days of Exercise per Week: 5 days    Minutes of Exercise per Session: 20 min  Stress: No Stress Concern Present (03/14/2022)   [employer] of Occupational Health - Occupational Stress Questionnaire    Feeling of Stress : Only a little  Social Connections: Moderately Isolated (03/14/2022)    Social Connection and Isolation Panel [NHANES]    Frequency of Communication with Friends and Family: Twice a week    Frequency of Social Gatherings with Friends and Family: Never    Attends Religious Services: 1 to 4 times per year    Active Member of [employer] or Organizations: No    Attends Banker Meetings: Never    Marital Status: Married    Review of Systems: Gen: Denies fever, chills, anorexia. Denies fatigue, weakness, weight loss.  CV: Denies chest pain, palpitations, syncope, peripheral edema, and claudication. Resp: Denies dyspnea at rest, cough, wheezing, coughing up blood, and pleurisy. GI: Denies vomiting blood, jaundice, and fecal incontinence.   Denies dysphagia or odynophagia. Derm: Denies rash, itching, dry skin Psych: Denies depression, anxiety, memory loss, confusion. No homicidal or suicidal ideation.  Heme: Denies bruising, bleeding, and enlarged lymph nodes.  Physical Exam: There were no vitals taken for this visit. General:   Alert and oriented. No distress noted. Pleasant and cooperative.  Head:  Normocephalic and atraumatic. Eyes:  Conjuctiva clear without scleral icterus. Heart:  S1, S2 present without murmurs appreciated. Lungs:  Clear to auscultation bilaterally. No wheezes, rales, or rhonchi. No distress.  Abdomen:  +BS, soft, non-tender and non-distended. No rebound or guarding. No HSM or masses noted. Msk:  Symmetrical without gross deformities. Normal posture. Extremities:  Without edema. Neurologic:  Alert and  oriented x4 Psych:  Normal mood and affect.    Assessment:     Plan:  ***   Ermalinda Memos, PA-C [facility] 09/24/2023

## 2023-09-23 ENCOUNTER — Encounter: Payer: Self-pay | Admitting: Gastroenterology

## 2023-09-23 ENCOUNTER — Ambulatory Visit (INDEPENDENT_AMBULATORY_CARE_PROVIDER_SITE_OTHER): Payer: 59 | Admitting: Gastroenterology

## 2023-09-23 ENCOUNTER — Other Ambulatory Visit: Payer: Self-pay | Admitting: Internal Medicine

## 2023-09-23 VITALS — BP 113/67 | HR 86 | Temp 97.9°F | Ht 65.0 in | Wt 277.2 lb

## 2023-09-23 DIAGNOSIS — D509 Iron deficiency anemia, unspecified: Secondary | ICD-10-CM

## 2023-09-23 DIAGNOSIS — K5900 Constipation, unspecified: Secondary | ICD-10-CM | POA: Diagnosis not present

## 2023-09-23 NOTE — Patient Instructions (Signed)
Please have blood work completed at [facility].  Continue Amitiza 24 mcg twice daily.  Continue omeprazole 40 mg twice daily.  We will plan to follow-up in 6 months or sooner if needed.  It was great to see you again today!  I hope you have a fantastic Christmas!  Ermalinda Memos, PA-C [facility]

## 2023-09-23 NOTE — Progress Notes (Signed)
Referring Provider: Billie Lade, MD Primary Care Physician:  Billie Lade, MD Primary GI Physician: Dr. Marletta Lor  Chief Complaint  Patient presents with   Follow-up    Follow up. No problems     HPI:   [name] is a 42 y.o. female with history of GERD, constipation, IDA, presenting today for follow-up of IDA and constipation.    Colonoscopy 04/02/23: Nonbleeding internal hemorrhoids diverticulosis in the sigmoid and transverse colon, three 4-6 mm polyps resected and retrieved.  Recommended repeat colonoscopy in 5 years.  Pathology with tubular adenoma   EGD: 04/02/23 Normal esophagus, gastritis biopsied, normal examined duodenum.  Recommended PPI twice daily. Pathology was benign, no H. pylori.     Last seen in the office 06/24/23. GERD well controlled on omeprazole 40 mg twice daily and prior weight loss and nausea resolved which was suspected to be secondary to gastritis/uncontrolled GERD. Advised she could try decreasing omeprazole to once daily. Chronic constipation improved with Amitiza 8 mcg BID, but not adequately managed. Recommended increasing Amitiza to 24 mcg BID. Regarding IDA, she had no overt GI bleeding and was limiting meloxicam. As we had no identified the cause of IDA, recommended completing givens capsule.    Givens 07/08/23:  - Normal gastric mucosa. ' - Slow gastric emptying, likely secondary to Trulicity.  - Few scattered areas of small bowel petechiations.  - One possible small, small bowel erosion.  - Overall, reassuring study, but unfortunately incomplete. Suspect petechiations/small erosion likely secondary to history of meloxicam use. This will likely continue to improve with NSAID avoidance.  Recommendations:  Abdominal x ray to verify capsule has passed.  Considered repeating capsule endoscopy holding Trulicity providing that capsule has passed vs stopping iron and monitoring for recurrent IDA. Spoke with patient who prefers to stop iron and  monitor.   Follow-up in December as already scheduled. Will repeat CBC at that time.     X-ray was completed showing capsule in area of the cecum.    Today:  IDA:  No brbpr or melena.  Stopped iron in September as advised.  No NSAIDs. No longer taking meloxicam.  Constipation:  Bowels moving much better with Amitiza 24 mcg twice daily.  No GERD symptoms on omeprazole BID. Can tell if she misses a dose.   Past Medical History:  Diagnosis Date   Anxiety    Arthritis    Depression    Diabetes (HCC)    GERD (gastroesophageal reflux disease)    Hypercholesteremia    Sinus infection 03/27/2021    Past Surgical History:  Procedure Laterality Date   BIOPSY  04/02/2023   Procedure: BIOPSY;  Surgeon: [name], DO;  [facility];  Service: Endoscopy;;   BREAST BIOPSY Left 04/03/2021   Procedure: BREAST BIOPSY; excision of duct lesion;  Surgeon: Lucretia Roers, MD;  Location: [facility];  Service: General;  Laterality: Left;   CHOLECYSTECTOMY N/A 07/13/2014   Procedure: LAPAROSCOPIC CHOLECYSTECTOMY;  Surgeon: Marlane Hatcher, MD;  Location: [facility];  Service: General;  Laterality: N/A;   COLONOSCOPY WITH PROPOFOL N/A 04/02/2023   Procedure: COLONOSCOPY WITH PROPOFOL;  Surgeon: [name], DO;  [facility];  Service: Endoscopy;  Laterality: N/A;  8:15 am, asa 3, pt knows to arrive at 6:00   DENTAL SURGERY  2018   plate top & bottom   DILATATION AND CURETTAGE/HYSTEROSCOPY WITH MINERVA N/A 04/16/2022   Procedure: DILATATION AND CURETTAGE/HYSTEROSCOPY WITH MINERVA;  Surgeon: Despina Hidden,  Amaryllis Dyke, MD;  Location: [facility];  Service: Gynecology;  Laterality: N/A;   ESOPHAGOGASTRODUODENOSCOPY (EGD) WITH PROPOFOL N/A 04/02/2023   Procedure: ESOPHAGOGASTRODUODENOSCOPY (EGD) WITH PROPOFOL;  Surgeon: [name], DO;  [facility];  Service: Endoscopy;  Laterality: N/A;   GIVENS CAPSULE STUDY N/A 07/08/2023   Normal gastric mucosa, slow gastric  emptying likely secondary to Trulicity with incomplete study, few scattered areas of small bowel petechia, 1 possible small bowel erosion.   POLYPECTOMY  04/02/2023   Procedure: POLYPECTOMY;  Surgeon: [name], DO;  [facility];  Service: Endoscopy;;   TUBAL LIGATION      Current Outpatient Medications  Medication Sig Dispense Refill   citalopram (CELEXA) 40 MG tablet Take 1 tablet by mouth once daily 30 tablet 0   fenofibrate (TRICOR) 145 MG tablet Take 1 tablet by mouth once daily 30 tablet 0   fluticasone (FLONASE) 50 MCG/ACT nasal spray Place 2 sprays into both nostrils daily. 16 g 6   ketorolac (TORADOL) 10 MG tablet TAKE 1 TABLET BY MOUTH EVERY 8 HOURS AS NEEDED 15 tablet 0   lisinopril (ZESTRIL) 5 MG tablet Take 1 tablet (5 mg total) by mouth daily. 90 tablet 3   lubiprostone (AMITIZA) 24 MCG capsule Take 1 capsule (24 mcg total) by mouth 2 (two) times daily with a meal. 60 capsule 3   megestrol (MEGACE) 40 MG tablet Take 1 tablet by mouth once daily 30 tablet 0   metFORMIN (GLUCOPHAGE-XR) 500 MG 24 hr tablet Take 1 tablet by mouth once daily with breakfast 30 tablet 0   norethindrone (MICRONOR) 0.35 MG tablet Take 1 tablet by mouth once daily 84 tablet 3   omeprazole (PRILOSEC) 40 MG capsule Take 1 capsule (40 mg total) by mouth 1-2 times daily before meals for heartburn/reflux. 60 capsule 3   simvastatin (ZOCOR) 40 MG tablet TAKE 1 TABLET BY MOUTH AT BEDTIME 90 tablet 0   TRULICITY 0.75 MG/0.5ML SOAJ INJECT 0.75 MG INTO THE SKIN ONCE A WEEK 4 mL 0   Vitamin D, Ergocalciferol, (DRISDOL) 1.25 MG (50000 UNIT) CAPS capsule Take 1 capsule by mouth once a week 8 capsule 0   No current facility-administered medications for this visit.    Allergies as of 09/23/2023   (No Known Allergies)    Family History  Problem Relation Age of Onset   Heart disease Mother    Diabetes Mother    Heart disease Father    Hypertension Father    Thyroid disease Father     Hyperlipidemia Father    Breast cancer Paternal Grandmother    Breast cancer Paternal Aunt    Cancer - Colon Paternal Uncle    Colon cancer Neg Hx     Social History   Socioeconomic History   Marital status: Married    Spouse name: Not on file   Number of children: 2   Years of education: Not on file   Highest education level: Not on file  Occupational History   Not on file  Tobacco Use   Smoking status: Former    Current packs/day: 0.00    Average packs/day: 0.5 packs/day for 15.0 years (7.5 ttl pk-yrs)    Types: Cigarettes    Start date: 11/13/2004    Quit date: 11/14/2019    Years since quitting: 3.8   Smokeless tobacco: Never  Vaping Use   Vaping status: Never Used  Substance and Sexual Activity   Alcohol use: No   Drug use:  No   Sexual activity: Yes    Birth control/protection: Surgical, Pill  Other Topics Concern   Not on file  Social History Narrative   Lives with her spouse and children.    Social Determinants of Health   Financial Resource Strain: Low Risk  (03/14/2022)   Overall Financial Resource Strain (CARDIA)    Difficulty of Paying Living Expenses: Not hard at all  Food Insecurity: No Food Insecurity (03/14/2022)   Hunger Vital Sign    Worried About Running Out of Food in the Last Year: Never true    Ran Out of Food in the Last Year: Never true  Transportation Needs: No Transportation Needs (03/14/2022)   PRAPARE - Administrator, Civil Service (Medical): No    Lack of Transportation (Non-Medical): No  Physical Activity: Insufficiently Active (03/14/2022)   Exercise Vital Sign    Days of Exercise per Week: 5 days    Minutes of Exercise per Session: 20 min  Stress: No Stress Concern Present (03/14/2022)   [employer] of Occupational Health - Occupational Stress Questionnaire    Feeling of Stress : Only a little  Social Connections: Moderately Isolated (03/14/2022)   Social Connection and Isolation Panel [NHANES]    Frequency of Communication  with Friends and Family: Twice a week    Frequency of Social Gatherings with Friends and Family: Never    Attends Religious Services: 1 to 4 times per year    Active Member of [employer] or Organizations: No    Attends Banker Meetings: Never    Marital Status: Married    Review of Systems: Gen: Denies fever, chills, presyncope, syncope, weakness. GI: See HPI  Heme: See HPI  Physical Exam: BP 113/67 (BP Location: Right Arm, Patient Position: Sitting, Cuff Size: Large)   Pulse 86   Temp 97.9 F (36.6 C) (Temporal)   Ht 5\' 5"  (1.651 m)   Wt 277 lb 3.2 oz (125.7 kg)   BMI 46.13 kg/m  General:   Alert and oriented. No distress noted. Pleasant and cooperative.  Head:  Normocephalic and atraumatic. Eyes:  Conjuctiva clear without scleral icterus. Heart:  S1, S2 present without murmurs appreciated. Lungs:  Clear to auscultation bilaterally. No wheezes, rales, or rhonchi. No distress.  Abdomen:  +BS, soft, non-tender and non-distended. No rebound or guarding. No HSM or masses noted. Msk:  Symmetrical without gross deformities. Normal posture. Extremities:  Without edema. Neurologic:  Alert and  oriented x4 Psych:  Normal mood and affect.    Assessment:  42 year old female with history of GERD, constipation, IDA, presenting today for follow-up of IDA and constipation.  Constipation: Well-controlled on Amitiza 24 mcg twice daily.  IDA: Etiology unclear.  No overt GI bleeding. Colonoscopy in June this year with nonbleeding internal hemorrhoids, 3 tubular adenomas with recommendations for 5-year surveillance.  EGD in June with gastritis biopsied and benign pathology.  Givens capsule in September.  Capsule did not make it through her entire small bowel, likely due to Trulicity.  Unclear how far the capsule made it, but no significant abnormalities were seen.  She did have a few scattered areas of small bowel petechiae and possibly 1 small erosion in her small bowel likely  secondary to prior Meloxicam use.  We had discussed repeating the givens capsule while holding Trulicity, but patient preferred to monitor for now.  Her iron was stopped in September as her iron levels and hemoglobin had normalized.  Will repeat her labs today.  Plan:  CBC, iron panel Amitiza 24 mcg twice daily. Continue omeprazole 40 mg twice daily. Continue to avoid NSAIDs. Follow-up in 6 months or sooner if needed.   Ermalinda Memos, PA-C [facility] 09/23/2023

## 2023-09-24 ENCOUNTER — Ambulatory Visit: Payer: 59 | Admitting: Gastroenterology

## 2023-09-24 LAB — CBC WITH DIFFERENTIAL/PLATELET
Basophils Absolute: 0 10*3/uL (ref 0.0–0.2)
Basos: 1 %
EOS (ABSOLUTE): 0.1 10*3/uL (ref 0.0–0.4)
Eos: 2 %
Hematocrit: 37.4 % (ref 34.0–46.6)
Hemoglobin: 12.8 g/dL (ref 11.1–15.9)
Immature Grans (Abs): 0 10*3/uL (ref 0.0–0.1)
Immature Granulocytes: 1 %
Lymphocytes Absolute: 1.1 10*3/uL (ref 0.7–3.1)
Lymphs: 23 %
MCH: 32.4 pg (ref 26.6–33.0)
MCHC: 34.2 g/dL (ref 31.5–35.7)
MCV: 95 fL (ref 79–97)
Monocytes Absolute: 0.4 10*3/uL (ref 0.1–0.9)
Monocytes: 9 %
Neutrophils Absolute: 3 10*3/uL (ref 1.4–7.0)
Neutrophils: 64 %
Platelets: 280 10*3/uL (ref 150–450)
RBC: 3.95 x10E6/uL (ref 3.77–5.28)
RDW: 12.9 % (ref 11.7–15.4)
WBC: 4.7 10*3/uL (ref 3.4–10.8)

## 2023-09-24 LAB — IRON,TIBC AND FERRITIN PANEL
Ferritin: 30 ng/mL (ref 15–150)
Iron Saturation: 23 % (ref 15–55)
Iron: 94 ug/dL (ref 27–159)
Total Iron Binding Capacity: 410 ug/dL (ref 250–450)
UIBC: 316 ug/dL (ref 131–425)

## 2023-10-09 ENCOUNTER — Other Ambulatory Visit: Payer: Self-pay | Admitting: Internal Medicine

## 2023-10-09 DIAGNOSIS — F419 Anxiety disorder, unspecified: Secondary | ICD-10-CM

## 2023-10-16 ENCOUNTER — Ambulatory Visit (INDEPENDENT_AMBULATORY_CARE_PROVIDER_SITE_OTHER): Payer: 59 | Admitting: Internal Medicine

## 2023-10-16 ENCOUNTER — Encounter: Payer: Self-pay | Admitting: Internal Medicine

## 2023-10-16 VITALS — BP 120/70 | HR 76 | Ht 64.0 in | Wt 272.2 lb

## 2023-10-16 DIAGNOSIS — E1169 Type 2 diabetes mellitus with other specified complication: Secondary | ICD-10-CM

## 2023-10-16 DIAGNOSIS — E559 Vitamin D deficiency, unspecified: Secondary | ICD-10-CM | POA: Diagnosis not present

## 2023-10-16 DIAGNOSIS — E119 Type 2 diabetes mellitus without complications: Secondary | ICD-10-CM | POA: Diagnosis not present

## 2023-10-16 DIAGNOSIS — E785 Hyperlipidemia, unspecified: Secondary | ICD-10-CM

## 2023-10-16 DIAGNOSIS — D509 Iron deficiency anemia, unspecified: Secondary | ICD-10-CM

## 2023-10-16 DIAGNOSIS — Z7985 Long-term (current) use of injectable non-insulin antidiabetic drugs: Secondary | ICD-10-CM | POA: Diagnosis not present

## 2023-10-16 DIAGNOSIS — K219 Gastro-esophageal reflux disease without esophagitis: Secondary | ICD-10-CM | POA: Diagnosis not present

## 2023-10-16 MED ORDER — QSYMIA 3.75-23 MG PO CP24: 1.0000 | ORAL_CAPSULE | Freq: Every day | ORAL | 0 refills | Status: DC

## 2023-10-16 MED ORDER — METFORMIN HCL ER 500 MG PO TB24: 500.0000 mg | ORAL_TABLET | Freq: Every day | ORAL | 3 refills | Status: AC

## 2023-10-16 NOTE — Assessment & Plan Note (Signed)
 Symptoms remain adequately controlled with omeprazole 40 mg 1-2 times per day.

## 2023-10-16 NOTE — Patient Instructions (Signed)
 It was a pleasure to see you today.  Thank you for giving us  the opportunity to be involved in your care.  Below is a brief recap of your visit and next steps.  We will plan to see you again in 3 months.  Summary Start Qsymia  for weight loss. We may need to switch to phentermine  pending coverage. Repeat labs ordered Follow up in 3 months

## 2023-10-16 NOTE — Progress Notes (Signed)
 Established Patient Office Visit  Subjective   Patient ID: [name], female    DOB: December 12, 1980  Age: 43 y.o. MRN: [id number]  Chief Complaint  Patient presents with   Diabetes    Follow up, patient has not been able to get Trulicity  in over two months due to insurance   [name] returns to care today for follow-up.  She was last evaluated by me in July 2024.  Trulicity  was discontinued in favor of Ozempic .  Low-dose lisinopril  was added for renal protection in the setting of diabetes mellitus.  87-month follow-up was arranged.  In the interim, she has been evaluated by gastroenterology for management of iron deficiency anemia.  There have otherwise been no acute interval events. [name] reports feeling well today.  She is asymptomatic currently.  Her acute concern is wanting to review medication options for weight loss.  Trulicity  and Ozempic  are no longer covered by her insurance.  She remains interested in medication options to help her lose weight.  Past Medical History:  Diagnosis Date   Anxiety    Arthritis    Depression    Diabetes (HCC)    GERD (gastroesophageal reflux disease)    Hypercholesteremia    Sinus infection 03/27/2021   Past Surgical History:  Procedure Laterality Date   BIOPSY  04/02/2023   Procedure: BIOPSY;  Surgeon: Cindie Carlin POUR, DO;  [facility];  Service: Endoscopy;;   BREAST BIOPSY Left 04/03/2021   Procedure: BREAST BIOPSY; excision of duct lesion;  Surgeon: Kallie Manuelita BROCKS, MD;  Location: [facility];  Service: General;  Laterality: Left;   CHOLECYSTECTOMY N/A 07/13/2014   Procedure: LAPAROSCOPIC CHOLECYSTECTOMY;  Surgeon: Elsie GORMAN Holland, MD;  Location: [facility];  Service: General;  Laterality: N/A;   COLONOSCOPY WITH PROPOFOL  N/A 04/02/2023   Procedure: COLONOSCOPY WITH PROPOFOL ;  Surgeon: Cindie Carlin POUR, DO;  [facility];  Service: Endoscopy;  Laterality: N/A;  8:15 am, asa 3, pt knows to arrive at 6:00   DENTAL  SURGERY  2018   plate top & bottom   DILATATION AND CURETTAGE/HYSTEROSCOPY WITH MINERVA N/A 04/16/2022   Procedure: DILATATION AND CURETTAGE/HYSTEROSCOPY WITH MINERVA;  Surgeon: Jayne Vonn DEL, MD;  Location: [facility];  Service: Gynecology;  Laterality: N/A;   ESOPHAGOGASTRODUODENOSCOPY (EGD) WITH PROPOFOL  N/A 04/02/2023   Procedure: ESOPHAGOGASTRODUODENOSCOPY (EGD) WITH PROPOFOL ;  Surgeon: Cindie Carlin POUR, DO;  [facility];  Service: Endoscopy;  Laterality: N/A;   GIVENS CAPSULE STUDY N/A 07/08/2023   Normal gastric mucosa, slow gastric emptying likely secondary to Trulicity  with incomplete study, few scattered areas of small bowel petechia, 1 possible small bowel erosion.   POLYPECTOMY  04/02/2023   Procedure: POLYPECTOMY;  Surgeon: Cindie Carlin POUR, DO;  [facility];  Service: Endoscopy;;   TUBAL LIGATION     Social History   Tobacco Use   Smoking status: Former    Current packs/day: 0.00    Average packs/day: 0.5 packs/day for 15.0 years (7.5 ttl pk-yrs)    Types: Cigarettes    Start date: 11/13/2004    Quit date: 11/14/2019    Years since quitting: 3.9   Smokeless tobacco: Never  Vaping Use   Vaping status: Never Used  Substance Use Topics   Alcohol use: No   Drug use: No   Family History  Problem Relation Age of Onset   Heart disease Mother    Diabetes Mother    Heart disease Father    Hypertension  Father    Thyroid disease Father    Hyperlipidemia Father    Breast cancer Paternal Grandmother    Breast cancer Paternal Aunt    Cancer - Colon Paternal Uncle    Colon cancer Neg Hx    No Known Allergies  Review of Systems  Constitutional:  Negative for chills and fever.  HENT:  Negative for sore throat.   Respiratory:  Negative for cough and shortness of breath.   Cardiovascular:  Negative for chest pain, palpitations and leg swelling.  Gastrointestinal:  Negative for abdominal pain, blood in stool, constipation, diarrhea, nausea and vomiting.   Genitourinary:  Negative for dysuria and hematuria.  Musculoskeletal:  Negative for myalgias.  Skin:  Negative for itching and rash.  Neurological:  Negative for dizziness and headaches.  Psychiatric/Behavioral:  Negative for depression and suicidal ideas.      Objective:     BP 120/70 (BP Location: Left Arm, Patient Position: Sitting, Cuff Size: Normal)   Pulse 76   Ht 5' 4 (1.626 m)   Wt 272 lb 3.2 oz (123.5 kg)   SpO2 96%   BMI 46.72 kg/m  BP Readings from Last 3 Encounters:  10/16/23 120/70  09/23/23 113/67  09/08/23 118/79   Physical Exam Vitals reviewed.  Constitutional:      General: She is not in acute distress.    Appearance: Normal appearance. She is obese. She is not toxic-appearing.  HENT:     Head: Normocephalic and atraumatic.     Right Ear: External ear normal.     Left Ear: External ear normal.     Nose: Nose normal. No congestion or rhinorrhea.     Mouth/Throat:     Mouth: Mucous membranes are moist.     Pharynx: Oropharynx is clear. No oropharyngeal exudate or posterior oropharyngeal erythema.  Eyes:     General: No scleral icterus.    Extraocular Movements: Extraocular movements intact.     Conjunctiva/sclera: Conjunctivae normal.     Pupils: Pupils are equal, round, and reactive to light.  Cardiovascular:     Rate and Rhythm: Normal rate and regular rhythm.     Pulses: Normal pulses.     Heart sounds: Normal heart sounds. No murmur heard.    No friction rub. No gallop.  Pulmonary:     Effort: Pulmonary effort is normal.     Breath sounds: Normal breath sounds. No wheezing, rhonchi or rales.  Abdominal:     General: Abdomen is flat. Bowel sounds are normal. There is no distension.     Palpations: Abdomen is soft.     Tenderness: There is no abdominal tenderness.  Musculoskeletal:        General: No swelling. Normal range of motion.     Cervical back: Normal range of motion.     Right lower leg: No edema.     Left lower leg: No edema.   Lymphadenopathy:     Cervical: No cervical adenopathy.  Skin:    General: Skin is warm and dry.     Capillary Refill: Capillary refill takes less than 2 seconds.     Coloration: Skin is not jaundiced.  Neurological:     General: No focal deficit present.     Mental Status: She is alert and oriented to person, place, and time.  Psychiatric:        Mood and Affect: Mood normal.        Behavior: Behavior normal.   Last CBC Lab Results  Component Value  Date   WBC 4.7 09/23/2023   HGB 12.8 09/23/2023   HCT 37.4 09/23/2023   MCV 95 09/23/2023   MCH 32.4 09/23/2023   RDW 12.9 09/23/2023   PLT 280 09/23/2023   Last metabolic panel Lab Results  Component Value Date   GLUCOSE 76 02/18/2023   NA 140 02/18/2023   K 4.5 02/18/2023   CL 106 02/18/2023   CO2 20 02/18/2023   BUN 13 02/18/2023   CREATININE 1.09 (H) 02/18/2023   EGFR 65 02/18/2023   CALCIUM  9.7 02/18/2023   PROT 7.3 02/18/2023   ALBUMIN 4.7 02/18/2023   LABGLOB 2.6 02/18/2023   AGRATIO 1.8 02/18/2023   BILITOT 0.2 02/18/2023   ALKPHOS 39 (L) 02/18/2023   AST 25 02/18/2023   ALT 28 02/18/2023   ANIONGAP 7 04/14/2022   Last lipids Lab Results  Component Value Date   CHOL 130 07/18/2022   HDL 29 (L) 07/18/2022   LDLCALC 77 07/18/2022   TRIG 134 07/18/2022   CHOLHDL 4.5 (H) 07/18/2022   Last hemoglobin A1c Lab Results  Component Value Date   HGBA1C 5.8 (H) 04/27/2023   Last thyroid functions Lab Results  Component Value Date   TSH 1.230 02/10/2022   Last vitamin D  Lab Results  Component Value Date   VD25OH 20.3 (L) 10/20/2022   The 10-year ASCVD risk score (Arnett DK, et al., 2019) is: 1.6%    Assessment & Plan:   Problem List Items Addressed This Visit       Gastroesophageal reflux disease   Symptoms remain adequately controlled with omeprazole  40 mg 1-2 times per day.      Type 2 diabetes mellitus without complications (HCC) - Primary   A1c 5.8 on labs from July.  She is currently  prescribed metformin  XR 500 mg daily.  Previously prescribed Trulicity  but it is no longer covered by insurance.  She has been off medication for the last 2 months. -Repeat A1c and urine microalbumin/creatinine ratio ordered today -For now, she will focus on weight loss.  Can increase metformin  if needed to improve diabetes control.      Hyperlipidemia   Lipid panel updated in October 2023.  Total cholesterol 130 and LDL 77.  She is currently prescribed simvastatin  40 mg daily.  Repeat lipid panel ordered today.      Morbid obesity (HCC)   Her weight today is 272 pounds.  BMI 46.7.  Her acute concern today is wanting to review medication options for weight loss.  Trulicity  and Ozempic  are no longer covered by insurance.  She has attempted to make lifestyle changes aimed at weight loss but is frustrated with a lack of results. -Through shared decision making, Qsymia  has been prescribed today.  We may ultimately need to switch to phentermine  pending insurance coverage.  Follow-up in 3 months for weight loss management.  Lifestyle modifications aimed at weight loss were reinforced today.      Iron deficiency anemia   Followed by gastroenterology.  Recent labs are stable.  Repeat CBC ordered today.      Return in about 3 months (around 01/14/2024) for weight loss management.   Manus FORBES Fireman, MD

## 2023-10-16 NOTE — Assessment & Plan Note (Signed)
 A1c 5.8 on labs from July.  She is currently prescribed metformin  XR 500 mg daily.  Previously prescribed Trulicity  but it is no longer covered by insurance.  She has been off medication for the last 2 months. -Repeat A1c and urine microalbumin/creatinine ratio ordered today -For now, she will focus on weight loss.  Can increase metformin  if needed to improve diabetes control.

## 2023-10-16 NOTE — Assessment & Plan Note (Signed)
 Lipid panel updated in October 2023.  Total cholesterol 130 and LDL 77.  She is currently prescribed simvastatin 40 mg daily.  Repeat lipid panel ordered today.

## 2023-10-16 NOTE — Assessment & Plan Note (Signed)
 Followed by gastroenterology.  Recent labs are stable.  Repeat CBC ordered today.

## 2023-10-16 NOTE — Assessment & Plan Note (Signed)
 Her weight today is 272 pounds.  BMI 46.7.  Her acute concern today is wanting to review medication options for weight loss.  Trulicity  and Ozempic  are no longer covered by insurance.  She has attempted to make lifestyle changes aimed at weight loss but is frustrated with a lack of results. -Through shared decision making, Qsymia  has been prescribed today.  We may ultimately need to switch to phentermine  pending insurance coverage.  Follow-up in 3 months for weight loss management.  Lifestyle modifications aimed at weight loss were reinforced today.

## 2023-10-18 ENCOUNTER — Other Ambulatory Visit: Payer: Self-pay | Admitting: Internal Medicine

## 2023-10-18 DIAGNOSIS — E785 Hyperlipidemia, unspecified: Secondary | ICD-10-CM

## 2023-10-18 DIAGNOSIS — E559 Vitamin D deficiency, unspecified: Secondary | ICD-10-CM

## 2023-10-18 MED ORDER — ATORVASTATIN CALCIUM 40 MG PO TABS: 40.0000 mg | ORAL_TABLET | Freq: Every day | ORAL | 3 refills | Status: DC

## 2023-10-18 MED ORDER — VITAMIN D (ERGOCALCIFEROL) 1.25 MG (50000 UNIT) PO CAPS: 50000.0000 [IU] | ORAL_CAPSULE | ORAL | 0 refills | Status: DC

## 2023-10-19 LAB — CBC WITH DIFFERENTIAL/PLATELET
Basophils Absolute: 0.1 10*3/uL (ref 0.0–0.2)
Basos: 1 %
EOS (ABSOLUTE): 0.1 10*3/uL (ref 0.0–0.4)
Eos: 2 %
Hematocrit: 42.1 % (ref 34.0–46.6)
Hemoglobin: 14.2 g/dL (ref 11.1–15.9)
Immature Grans (Abs): 0 10*3/uL (ref 0.0–0.1)
Immature Granulocytes: 0 %
Lymphocytes Absolute: 1.6 10*3/uL (ref 0.7–3.1)
Lymphs: 30 %
MCH: 31.9 pg (ref 26.6–33.0)
MCHC: 33.7 g/dL (ref 31.5–35.7)
MCV: 95 fL (ref 79–97)
Monocytes Absolute: 0.5 10*3/uL (ref 0.1–0.9)
Monocytes: 9 %
Neutrophils Absolute: 3.1 10*3/uL (ref 1.4–7.0)
Neutrophils: 58 %
Platelets: 281 10*3/uL (ref 150–450)
RBC: 4.45 x10E6/uL (ref 3.77–5.28)
RDW: 12.5 % (ref 11.7–15.4)
WBC: 5.4 10*3/uL (ref 3.4–10.8)

## 2023-10-19 LAB — CMP14+EGFR
ALT: 30 [IU]/L (ref 0–32)
AST: 24 [IU]/L (ref 0–40)
Albumin: 4.7 g/dL (ref 3.9–4.9)
Alkaline Phosphatase: 47 [IU]/L (ref 44–121)
BUN/Creatinine Ratio: 14 (ref 9–23)
BUN: 15 mg/dL (ref 6–24)
Bilirubin Total: <0.2 mg/dL (ref 0.0–1.2)
CO2: 21 mmol/L (ref 20–29)
Calcium: 10.3 mg/dL — ABNORMAL HIGH (ref 8.7–10.2)
Chloride: 107 mmol/L — ABNORMAL HIGH (ref 96–106)
Creatinine, Ser: 1.05 mg/dL — ABNORMAL HIGH (ref 0.57–1.00)
Globulin, Total: 2.6 g/dL (ref 1.5–4.5)
Glucose: 94 mg/dL (ref 70–99)
Potassium: 4.6 mmol/L (ref 3.5–5.2)
Sodium: 142 mmol/L (ref 134–144)
Total Protein: 7.3 g/dL (ref 6.0–8.5)
eGFR: 68 mL/min/{1.73_m2} (ref >59)

## 2023-10-19 LAB — MICROALBUMIN / CREATININE URINE RATIO
Creatinine, Urine: 182.2 mg/dL
Microalb/Creat Ratio: 5 mg/g{creat} (ref 0–29)
Microalbumin, Urine: 8.8 ug/mL

## 2023-10-19 LAB — B12 AND FOLATE PANEL
Folate: 4.3 ng/mL (ref >3.0)
Vitamin B-12: 643 pg/mL (ref 232–1245)

## 2023-10-19 LAB — HEMOGLOBIN A1C
Est. average glucose Bld gHb Est-mCnc: 126 mg/dL
Hgb A1c MFr Bld: 6 % — ABNORMAL HIGH (ref 4.8–5.6)

## 2023-10-19 LAB — LIPID PANEL
Chol/HDL Ratio: 4.4 {ratio} (ref 0.0–4.4)
Cholesterol, Total: 161 mg/dL (ref 100–199)
HDL: 37 mg/dL — ABNORMAL LOW (ref >39)
LDL Chol Calc (NIH): 97 mg/dL (ref 0–99)
Triglycerides: 155 mg/dL — ABNORMAL HIGH (ref 0–149)
VLDL Cholesterol Cal: 27 mg/dL (ref 5–40)

## 2023-10-19 LAB — VITAMIN D 25 HYDROXY (VIT D DEFICIENCY, FRACTURES): Vit D, 25-Hydroxy: 15.1 ng/mL — ABNORMAL LOW (ref 30.0–100.0)

## 2023-10-19 LAB — TSH+FREE T4
Free T4: 0.97 ng/dL (ref 0.82–1.77)
TSH: 1.1 u[IU]/mL (ref 0.450–4.500)

## 2023-10-20 ENCOUNTER — Other Ambulatory Visit: Payer: Self-pay | Admitting: Internal Medicine

## 2023-10-23 ENCOUNTER — Encounter: Payer: Self-pay | Admitting: Internal Medicine

## 2023-10-26 MED ORDER — PHENTERMINE HCL 15 MG PO CAPS: 15.0000 mg | ORAL_CAPSULE | ORAL | 0 refills | Status: DC

## 2023-10-29 ENCOUNTER — Ambulatory Visit: Payer: 59 | Admitting: Internal Medicine

## 2023-11-06 ENCOUNTER — Other Ambulatory Visit: Payer: Self-pay | Admitting: Internal Medicine

## 2023-11-06 DIAGNOSIS — F419 Anxiety disorder, unspecified: Secondary | ICD-10-CM

## 2023-11-23 MED ORDER — PHENTERMINE HCL 37.5 MG PO CAPS
37.5000 mg | ORAL_CAPSULE | ORAL | 0 refills | Status: DC
Start: 2023-11-23 — End: 2023-12-28

## 2023-11-23 NOTE — Addendum Note (Signed)
 Addended by: Finis Hendricksen E on: 11/23/2023 10:22 AM   Modules accepted: Orders

## 2023-12-04 ENCOUNTER — Other Ambulatory Visit: Payer: Self-pay | Admitting: Internal Medicine

## 2023-12-04 DIAGNOSIS — F419 Anxiety disorder, unspecified: Secondary | ICD-10-CM

## 2023-12-08 ENCOUNTER — Encounter: Payer: Self-pay | Admitting: Emergency Medicine

## 2023-12-08 ENCOUNTER — Ambulatory Visit
Admission: EM | Admit: 2023-12-08 | Discharge: 2023-12-08 | Disposition: A | Discharge status: Home / Self Care | Payer: 59 | Attending: Nurse Practitioner | Admitting: Nurse Practitioner

## 2023-12-08 DIAGNOSIS — M545 Low back pain, unspecified: Secondary | ICD-10-CM | POA: Diagnosis not present

## 2023-12-08 MED ORDER — PREDNISONE 20 MG PO TABS: 20.0000 mg | ORAL_TABLET | Freq: Every day | ORAL | 0 refills | Status: AC

## 2023-12-08 MED ORDER — CYCLOBENZAPRINE HCL 10 MG PO TABS: 10.0000 mg | ORAL_TABLET | Freq: Two times a day (BID) | ORAL | 0 refills | Status: DC | PRN

## 2023-12-08 MED ORDER — KETOROLAC TROMETHAMINE 30 MG/ML IJ SOLN
15.0000 mg | Freq: Once | INTRAMUSCULAR | Status: AC
  Administered 2023-12-08: 15 mg via INTRAMUSCULAR

## 2023-12-08 MED ORDER — DEXAMETHASONE SODIUM PHOSPHATE 10 MG/ML IJ SOLN
10.0000 mg | INTRAMUSCULAR | Status: AC
  Administered 2023-12-08: 10 mg via INTRAMUSCULAR

## 2023-12-08 NOTE — ED Triage Notes (Signed)
 Lower right sided back pain x 1 week. Pain worse with movement.  Has tried tylenol and "Dones back pills" without relief

## 2023-12-08 NOTE — Discharge Instructions (Addendum)
 You were given an injection of Decadron 10 mg and Toradol 15 mg today.  Do not take any additional NSAIDs such as Aleve, Advil, ibuprofen, or naproxen today.  Continue over-the-counter Tylenol for breakthrough pain or discomfort.  Also please monitor your blood glucose levels as they may be slightly increased due to the medications administered today.  If glucose levels exceed 450, please go to the emergency department immediately. Take medication as prescribed.  Continue to monitor your blood glucose levels while taking the prednisone. Continue over-the-counter Tylenol or other pain medication you are currently taking. Recommend the use of ice or heat.  Apply ice for pain or swelling, heat for spasm or stiffness.  Apply for 20 minutes, remove for 1 hour, repeat as needed. Recommend staying as active as you possibly can to help decrease your recovery time. Perform stretching exercises provided to help with pain and discomfort. Go to the emergency department immediately if you experience loss of bladder or bowel function, become unable to ambulate, worsening pain, or other concerns. If symptoms fail to improve, recommend following up with orthopedics for further evaluation.  Have given you information for 2 offices in the area. Follow-up as needed.

## 2023-12-08 NOTE — ED Provider Notes (Signed)
 [facility]    CSN: [id number] Arrival date & time: 12/08/23  0800      History   Chief Complaint No chief complaint on file.   HPI [name] is a 43 y.o. female.   The history is provided by the patient.   Patient presents for complaints of right-sided low back pain is been present for the past week.  Patient denies any injury or trauma.  States pain does not move.  She states that when the pain persist for a long period of time, she does experience tingling in that area.  Denies loss of bowel or bladder function, inability to ambulate, numbness, or tingling.  Patient states pain worsens when she is sitting down and trying to stand up.  States she has been taking over-the-counter Doan's back pills and Tylenol with minimal relief.  Denies prior history of back pain.  Past Medical History:  Diagnosis Date   Anxiety    Arthritis    Depression    Diabetes (HCC)    GERD (gastroesophageal reflux disease)    Hypercholesteremia    Sinus infection 03/27/2021    Patient Active Problem List   Diagnosis Date Noted   Dysphagia 03/05/2023   Gastroesophageal reflux disease 03/05/2023   Change in bowel habits 03/05/2023   Nausea without vomiting 03/05/2023   Loss of weight 03/05/2023   Iron deficiency anemia 03/04/2023   Diarrhea 02/18/2023   Abdominal pain, RUQ 02/18/2023   H/O seasonal allergies 08/25/2022   Type 2 diabetes mellitus without complications (HCC) 07/22/2022   Need for Tdap vaccination 07/22/2022   Menorrhagia with regular cycle    Vitamin D deficiency 02/18/2022   Annual physical exam 02/18/2022   Prediabetes 02/18/2022   Refused influenza vaccine 12/17/2021   Hot flashes 12/17/2021   Bloody discharge from left nipple 03/21/2021   Right hip and buttock pain, ishial bursitis 07/20/2020   Anxiety 03/29/2019   Contact dermatitis 03/29/2019   Dysmenorrhea 06/30/2018   Hyperlipidemia 09/27/2015   Morbid obesity (HCC) 09/27/2015   Cigarette  nicotine dependence, uncomplicated 09/27/2015   Cholecystitis with cholelithiasis 07/13/2014    Past Surgical History:  Procedure Laterality Date   BIOPSY  04/02/2023   Procedure: BIOPSY;  Surgeon: [name], DO;  [facility];  Service: Endoscopy;;   BREAST BIOPSY Left 04/03/2021   Procedure: BREAST BIOPSY; excision of duct lesion;  Surgeon: Lucretia Roers, MD;  Location: [facility];  Service: General;  Laterality: Left;   CHOLECYSTECTOMY N/A 07/13/2014   Procedure: LAPAROSCOPIC CHOLECYSTECTOMY;  Surgeon: Marlane Hatcher, MD;  Location: [facility];  Service: General;  Laterality: N/A;   COLONOSCOPY WITH PROPOFOL N/A 04/02/2023   Procedure: COLONOSCOPY WITH PROPOFOL;  Surgeon: [name], DO;  [facility]: [facility];  Service: Endoscopy;  Laterality: N/A;  8:15 am, asa 3, pt knows to arrive at 6:00   DENTAL SURGERY  2018   plate top & bottom   DILATATION AND CURETTAGE/HYSTEROSCOPY WITH MINERVA N/A 04/16/2022   Procedure: DILATATION AND CURETTAGE/HYSTEROSCOPY WITH MINERVA;  Surgeon: Lazaro Arms, MD;  Location: [facility];  Service: Gynecology;  Laterality: N/A;   ESOPHAGOGASTRODUODENOSCOPY (EGD) WITH PROPOFOL N/A 04/02/2023   Procedure: ESOPHAGOGASTRODUODENOSCOPY (EGD) WITH PROPOFOL;  Surgeon: [name], DO;  [facility];  Service: Endoscopy;  Laterality: N/A;   GIVENS CAPSULE STUDY N/A 07/08/2023   Normal gastric mucosa, slow gastric emptying likely secondary to Trulicity with incomplete study, few scattered areas of small bowel petechia, 1  possible small bowel erosion.   POLYPECTOMY  04/02/2023   Procedure: POLYPECTOMY;  Surgeon: [name], DO;  [facility];  Service: Endoscopy;;   TUBAL LIGATION      OB History     Gravida  2   Para  2   Term      Preterm      AB      Living  2      SAB      IAB      Ectopic      Multiple      Live Births               Home Medications    Prior to Admission  medications   Medication Sig Start Date End Date Taking? Authorizing Provider  atorvastatin (LIPITOR) 40 MG tablet Take 1 tablet (40 mg total) by mouth daily. 10/18/23   Billie Lade, MD  citalopram (CELEXA) 40 MG tablet Take 1 tablet by mouth once daily 12/04/23   Billie Lade, MD  fenofibrate (TRICOR) 145 MG tablet Take 1 tablet by mouth once daily 09/23/23   Billie Lade, MD  lisinopril (ZESTRIL) 5 MG tablet Take 1 tablet (5 mg total) by mouth daily. 04/27/23   Billie Lade, MD  lubiprostone (AMITIZA) 24 MCG capsule Take 1 capsule (24 mcg total) by mouth 2 (two) times daily with a meal. 06/24/23   Letta Median, PA-C  megestrol (MEGACE) 40 MG tablet Take 1 tablet by mouth once daily 12/04/23   Billie Lade, MD  metFORMIN (GLUCOPHAGE-XR) 500 MG 24 hr tablet Take 1 tablet (500 mg total) by mouth daily with breakfast. 10/16/23   Billie Lade, MD  norethindrone (MICRONOR) 0.35 MG tablet Take 1 tablet by mouth once daily 04/15/23   Lazaro Arms, MD  omeprazole (PRILOSEC) 40 MG capsule Take 1 capsule (40 mg total) by mouth 1-2 times daily before meals for heartburn/reflux. 06/24/23   Letta Median, PA-C  phentermine 37.5 MG capsule Take 1 capsule (37.5 mg total) by mouth every morning. 11/23/23   Billie Lade, MD  Vitamin D, Ergocalciferol, (DRISDOL) 1.25 MG (50000 UNIT) CAPS capsule Take 1 capsule (50,000 Units total) by mouth once a week for 12 doses. 10/18/23 01/04/24  Billie Lade, MD    Family History Family History  Problem Relation Age of Onset   Heart disease Mother    Diabetes Mother    Heart disease Father    Hypertension Father    Thyroid disease Father    Hyperlipidemia Father    Breast cancer Paternal Grandmother    Breast cancer Paternal Aunt    Cancer - Colon Paternal Uncle    Colon cancer Neg Hx     Social History Social History   Tobacco Use   Smoking status: Former    Current packs/day: 0.00    Average packs/day: 0.5 packs/day for 15.0  years (7.5 ttl pk-yrs)    Types: Cigarettes    Start date: 11/13/2004    Quit date: 11/14/2019    Years since quitting: 4.0   Smokeless tobacco: Never  Vaping Use   Vaping status: Never Used  Substance Use Topics   Alcohol use: No   Drug use: No     Allergies   Patient has no known allergies.   Review of Systems Review of Systems   Physical Exam Triage Vital Signs ED Triage Vitals  Encounter Vitals Group  BP 12/08/23 0814 112/73     Systolic BP Percentile --      Diastolic BP Percentile --      Pulse Rate 12/08/23 0814 93     Resp 12/08/23 0814 18     Temp 12/08/23 0814 98.3 F (36.8 C)     Temp Source 12/08/23 0814 Oral     SpO2 12/08/23 0814 98 %     Weight --      Height --      Head Circumference --      Peak Flow --      Pain Score 12/08/23 0815 9     Pain Loc --      Pain Education --      Exclude from Growth Chart --    No data found.  Updated Vital Signs BP 112/73 (BP Location: Right Arm)   Pulse 93   Temp 98.3 F (36.8 C) (Oral)   Resp 18   SpO2 98%   Visual Acuity Right Eye Distance:   Left Eye Distance:   Bilateral Distance:    Right Eye Near:   Left Eye Near:    Bilateral Near:     Physical Exam Vitals and nursing note reviewed.  Constitutional:      General: She is not in acute distress.    Appearance: Normal appearance.  HENT:     Head: Normocephalic.  Eyes:     Extraocular Movements: Extraocular movements intact.     Conjunctiva/sclera: Conjunctivae normal.     Pupils: Pupils are equal, round, and reactive to light.  Cardiovascular:     Rate and Rhythm: Normal rate and regular rhythm.     Pulses: Normal pulses.     Heart sounds: Normal heart sounds.  Abdominal:     General: Bowel sounds are normal.     Palpations: Abdomen is soft.     Tenderness: There is no abdominal tenderness.  Musculoskeletal:     Cervical back: Normal range of motion.     Lumbar back: Tenderness present. No swelling, edema, deformity or signs of  trauma. Decreased range of motion. Negative right straight leg raise test and negative left straight leg raise test.  Lymphadenopathy:     Cervical: No cervical adenopathy.  Skin:    General: Skin is warm and dry.  Neurological:     General: No focal deficit present.     Mental Status: She is alert and oriented to person, place, and time.  Psychiatric:        Mood and Affect: Mood normal.        Behavior: Behavior normal.      UC Treatments / Results  Labs (all labs ordered are listed, but only abnormal results are displayed) Labs Reviewed - No data to display  EKG   Radiology No results found.  Procedures Procedures (including critical care time)  Medications Ordered in UC Medications - No data to display  Initial Impression / Assessment and Plan / UC Course  I have reviewed the triage vital signs and the nursing notes.  Pertinent labs & imaging results that were available during my care of the patient were reviewed by me and considered in my medical decision making (see chart for details).  Low back pain most likely with musculoskeletal etiology.  On exam, there is no bruising, redness, or swelling present.  Decadron 10 mg IM and Toradol 15 mg IM administered (creatinine 1.05 on 10/16/2023).  Cyclobenzaprine 10 mg for back pain and spasm, will have  patient continue over-the-counter Tylenol given her slightly increased creatinine per lab review.  Supportive care recommendations were provided and discussed with the patient to include the use of ice or heat, staying active, and stretching exercises.  Patient advised to monitor blood glucose levels over the next several days, parameters were given for ER follow-up.  Discussed strict ER follow-up precautions with the patient.  Patient was advised to follow-up with orthopedics if symptoms fail to improve.  Patient was in agreement with this plan of care and verbalizes understanding.  All questions were answered.  Patient stable for  discharge.  Final Clinical Impressions(s) / UC Diagnoses   Final diagnoses:  None   Discharge Instructions   None    ED Prescriptions   None    PDMP not reviewed this encounter.   Abran Cantor, NP 12/08/23 [phone]

## 2023-12-12 ENCOUNTER — Other Ambulatory Visit: Payer: Self-pay | Admitting: Gastroenterology

## 2023-12-12 DIAGNOSIS — K5904 Chronic idiopathic constipation: Secondary | ICD-10-CM

## 2023-12-27 ENCOUNTER — Other Ambulatory Visit: Payer: Self-pay | Admitting: Internal Medicine

## 2024-01-02 ENCOUNTER — Other Ambulatory Visit: Payer: Self-pay | Admitting: Internal Medicine

## 2024-01-02 DIAGNOSIS — F419 Anxiety disorder, unspecified: Secondary | ICD-10-CM

## 2024-01-08 ENCOUNTER — Other Ambulatory Visit: Payer: Self-pay | Admitting: Internal Medicine

## 2024-01-08 DIAGNOSIS — E559 Vitamin D deficiency, unspecified: Secondary | ICD-10-CM

## 2024-01-12 ENCOUNTER — Encounter: Payer: Self-pay | Admitting: Internal Medicine

## 2024-01-12 ENCOUNTER — Ambulatory Visit (INDEPENDENT_AMBULATORY_CARE_PROVIDER_SITE_OTHER): Payer: 59 | Admitting: Internal Medicine

## 2024-01-12 VITALS — BP 129/78 | HR 84 | Ht 65.0 in | Wt 248.8 lb

## 2024-01-12 DIAGNOSIS — E119 Type 2 diabetes mellitus without complications: Secondary | ICD-10-CM

## 2024-01-12 DIAGNOSIS — F419 Anxiety disorder, unspecified: Secondary | ICD-10-CM | POA: Diagnosis not present

## 2024-01-12 DIAGNOSIS — Z7984 Long term (current) use of oral hypoglycemic drugs: Secondary | ICD-10-CM | POA: Diagnosis not present

## 2024-01-12 DIAGNOSIS — E1169 Type 2 diabetes mellitus with other specified complication: Secondary | ICD-10-CM

## 2024-01-12 DIAGNOSIS — E559 Vitamin D deficiency, unspecified: Secondary | ICD-10-CM | POA: Diagnosis not present

## 2024-01-12 DIAGNOSIS — E785 Hyperlipidemia, unspecified: Secondary | ICD-10-CM | POA: Diagnosis not present

## 2024-01-12 MED ORDER — HYDROXYZINE PAMOATE 25 MG PO CAPS: 25.0000 mg | ORAL_CAPSULE | Freq: Three times a day (TID) | ORAL | 2 refills | Status: AC | PRN

## 2024-01-12 NOTE — Assessment & Plan Note (Signed)
 Noted on labs from January.  She has completed high-dose, weekly vitamin D supplementation. -Repeat vitamin D level ordered today

## 2024-01-12 NOTE — Patient Instructions (Signed)
 It was a pleasure to see you today.  Thank you for giving [place] the opportunity to be involved in your care.  Below is a brief recap of your visit and next steps.  We will plan to see you again in 3 months.  Summary Resume hydroxyzine for as needed anxiety relief Repeat vitamin D level Follow up in 3 months Keep up the good work!

## 2024-01-12 NOTE — Assessment & Plan Note (Signed)
 Lipid panel updated in January. Total cholesterol 161 and LDL 97. Simvastatin was discontinued in favor of atorvastatin 40 mg daily. She has no experienced any adverse side effects with this change. -Repeat lipid panel at follow up in 3 months.

## 2024-01-12 NOTE — Progress Notes (Signed)
 Established Patient Office Visit  Subjective   Patient ID: [name], female    DOB: [birth year]  Age: 43 y.o. MRN: [id number]  Chief Complaint  Patient presents with   Anxiety    Anxiety Is getting worse    Diabetes    Follow up    Ms. Schraeder returns to care today for routine follow-up.  She was last evaluated by me on 1/3 at which time Qsymia was prescribed in the setting of morbid obesity.  Repeat labs were ordered.  Ultimately, simvastatin was discontinued in favor of atorvastatin.  Phentermine was also prescribed as Qsymia was not covered.  66-month follow-up was arranged for reassessment.  In the interim, she presented to urgent care on 2/25 endorsing right-sided low back pain.  There have otherwise been no acute interval events.  Today she reports feeling fairly well.  Her acute concern is worsening anxiety.  She is currently prescribed citalopram 40 mg daily.  She has previously been prescribed hydroxyzine for as needed anxiety relief but is currently out of medication.  Past Medical History:  Diagnosis Date   Anxiety    Arthritis    Depression    Diabetes (HCC)    GERD (gastroesophageal reflux disease)    Hypercholesteremia    Sinus infection 03/27/2021   Past Surgical History:  Procedure Laterality Date   BIOPSY  04/02/2023   Procedure: BIOPSY;  Surgeon: [name], DO;  [facility];  Service: Endoscopy;;   BREAST BIOPSY Left 04/03/2021   Procedure: BREAST BIOPSY; excision of duct lesion;  Surgeon: Lucretia Roers, MD;  Location: [facility];  Service: General;  Laterality: Left;   CHOLECYSTECTOMY N/A 07/13/2014   Procedure: LAPAROSCOPIC CHOLECYSTECTOMY;  Surgeon: Marlane Hatcher, MD;  Location: [facility];  Service: General;  Laterality: N/A;   COLONOSCOPY WITH PROPOFOL N/A 04/02/2023   Procedure: COLONOSCOPY WITH PROPOFOL;  Surgeon: [name], DO;  [facility];  Service: Endoscopy;  Laterality: N/A;  8:15 am, asa 3, pt knows to  arrive at 6:00   DENTAL SURGERY  2018   plate top & bottom   DILATATION AND CURETTAGE/HYSTEROSCOPY WITH MINERVA N/A 04/16/2022   Procedure: DILATATION AND CURETTAGE/HYSTEROSCOPY WITH MINERVA;  Surgeon: Lazaro Arms, MD;  Location: [facility];  Service: Gynecology;  Laterality: N/A;   ESOPHAGOGASTRODUODENOSCOPY (EGD) WITH PROPOFOL N/A 04/02/2023   Procedure: ESOPHAGOGASTRODUODENOSCOPY (EGD) WITH PROPOFOL;  Surgeon: [name], DO;  [facility];  Service: Endoscopy;  Laterality: N/A;   GIVENS CAPSULE STUDY N/A 07/08/2023   Normal gastric mucosa, slow gastric emptying likely secondary to Trulicity with incomplete study, few scattered areas of small bowel petechia, 1 possible small bowel erosion.   POLYPECTOMY  04/02/2023   Procedure: POLYPECTOMY;  Surgeon: [name], DO;  [facility];  Service: Endoscopy;;   TUBAL LIGATION     Social History   Tobacco Use   Smoking status: Former    Current packs/day: 0.00    Average packs/day: 0.5 packs/day for 15.0 years (7.5 ttl pk-yrs)    Types: Cigarettes    Start date: 11/13/2004    Quit date: 11/14/2019    Years since quitting: 4.1   Smokeless tobacco: Never  Vaping Use   Vaping status: Never Used  Substance Use Topics   Alcohol use: No   Drug use: No   Family History  Problem Relation Age of Onset   Heart disease Mother    Diabetes Mother  Heart disease Father    Hypertension Father    Thyroid disease Father    Hyperlipidemia Father    Breast cancer Paternal Grandmother    Breast cancer Paternal Aunt    Cancer - Colon Paternal Uncle    Colon cancer Neg Hx    No Known Allergies  Review of Systems  Constitutional:  Negative for chills and fever.  HENT:  Negative for sore throat.   Respiratory:  Negative for cough and shortness of breath.   Cardiovascular:  Negative for chest pain, palpitations and leg swelling.  Gastrointestinal:  Negative for abdominal pain, blood in stool, constipation,  diarrhea, nausea and vomiting.  Genitourinary:  Negative for dysuria and hematuria.  Musculoskeletal:  Negative for myalgias.  Skin:  Negative for itching and rash.  Neurological:  Negative for dizziness and headaches.  Psychiatric/Behavioral:  Negative for depression and suicidal ideas. The patient is nervous/anxious.      Objective:     BP 129/78 (BP Location: Left Arm, Patient Position: Sitting, Cuff Size: Large)   Pulse 84   Ht 5\' 5"  (1.651 m)   Wt 248 lb 12.8 oz (112.9 kg)   SpO2 97%   BMI 41.40 kg/m  BP Readings from Last 3 Encounters:  01/12/24 129/78  12/08/23 112/73  10/16/23 120/70   Physical Exam Vitals reviewed.  Constitutional:      General: She is not in acute distress.    Appearance: Normal appearance. She is obese. She is not toxic-appearing.  HENT:     Head: Normocephalic and atraumatic.     Right Ear: External ear normal.     Left Ear: External ear normal.     Nose: Nose normal. No congestion or rhinorrhea.     Mouth/Throat:     Mouth: Mucous membranes are moist.     Pharynx: Oropharynx is clear. No oropharyngeal exudate or posterior oropharyngeal erythema.  Eyes:     General: No scleral icterus.    Extraocular Movements: Extraocular movements intact.     Conjunctiva/sclera: Conjunctivae normal.     Pupils: Pupils are equal, round, and reactive to light.  Cardiovascular:     Rate and Rhythm: Normal rate and regular rhythm.     Pulses: Normal pulses.     Heart sounds: Normal heart sounds. No murmur heard.    No friction rub. No gallop.  Pulmonary:     Effort: Pulmonary effort is normal.     Breath sounds: Normal breath sounds. No wheezing, rhonchi or rales.  Abdominal:     General: Abdomen is flat. Bowel sounds are normal. There is no distension.     Palpations: Abdomen is soft.     Tenderness: There is no abdominal tenderness.  Musculoskeletal:        General: No swelling. Normal range of motion.     Cervical back: Normal range of motion.      Right lower leg: No edema.     Left lower leg: No edema.  Lymphadenopathy:     Cervical: No cervical adenopathy.  Skin:    General: Skin is warm and dry.     Capillary Refill: Capillary refill takes less than 2 seconds.     Coloration: Skin is not jaundiced.  Neurological:     General: No focal deficit present.     Mental Status: She is alert and oriented to person, place, and time.  Psychiatric:        Mood and Affect: Mood normal.        Behavior:  Behavior normal.   Diabetic foot exam was performed.  No deformities or other abnormal visual findings.  Posterior tibialis and dorsalis pulse intact bilaterally.  Intact to touch and monofilament testing bilaterally.   Last CBC Lab Results  Component Value Date   WBC 5.4 10/16/2023   HGB 14.2 10/16/2023   HCT 42.1 10/16/2023   MCV 95 10/16/2023   MCH 31.9 10/16/2023   RDW 12.5 10/16/2023   PLT 281 10/16/2023   Last metabolic panel Lab Results  Component Value Date   GLUCOSE 94 10/16/2023   NA 142 10/16/2023   K 4.6 10/16/2023   CL 107 (H) 10/16/2023   CO2 21 10/16/2023   BUN 15 10/16/2023   CREATININE 1.05 (H) 10/16/2023   EGFR 68 10/16/2023   CALCIUM 10.3 (H) 10/16/2023   PROT 7.3 10/16/2023   ALBUMIN 4.7 10/16/2023   LABGLOB 2.6 10/16/2023   AGRATIO 1.8 02/18/2023   BILITOT <0.2 10/16/2023   ALKPHOS 47 10/16/2023   AST 24 10/16/2023   ALT 30 10/16/2023   ANIONGAP 7 04/14/2022   Last lipids Lab Results  Component Value Date   CHOL 161 10/16/2023   HDL 37 (L) 10/16/2023   LDLCALC 97 10/16/2023   TRIG 155 (H) 10/16/2023   CHOLHDL 4.4 10/16/2023   Last hemoglobin A1c Lab Results  Component Value Date   HGBA1C 6.0 (H) 10/16/2023   Last thyroid functions Lab Results  Component Value Date   TSH 1.100 10/16/2023   Last vitamin D Lab Results  Component Value Date   VD25OH 15.1 (L) 10/16/2023   Last vitamin B12 and Folate Lab Results  Component Value Date   VITAMINB12 643 10/16/2023   FOLATE 4.3  10/16/2023   The 10-year ASCVD risk score (Arnett DK, et al., 2019) is: 1.8%    Assessment & Plan:   Problem List Items Addressed This Visit       Type 2 diabetes mellitus without complications (HCC)   A1c 6.0 on labs from January.  She is currently prescribed metformin XR 500 mg daily.  Trulicity is not covered by insurance. She has lost 24 pounds since her last appointment. -No medication changes today. Repeat A1c at follow up in 3 months. Can further increase metformin if needed. -Diabetic foot exam completed today        Hyperlipidemia   Lipid panel updated in January. Total cholesterol 161 and LDL 97. Simvastatin was discontinued in favor of atorvastatin 40 mg daily. She has no experienced any adverse side effects with this change. -Repeat lipid panel at follow up in 3 months.      Morbid obesity (HCC)   Current weight 248 pounds. BMI 41.4. She has lost 24 pounds since starting phentermine earlier this year. She has adhered to lifestyle changes aimed at weight loss as well.  -Patient was congratulated on her progress -Continue phentermine as currently prescribed      Anxiety   Previously well-controlled on Celexa. Today she endorses worsening episodes of anxiety, particularly with adverse weather.  -Add hydroxyzine for as needed anxiety relief      Vitamin D deficiency - Primary   Noted on labs from January. She has completed high-dose, weekly vitamin D supplementation. Repeat vitamin D level ordered today.        Return in about 3 months (around 04/12/2024).    Billie Lade, MD

## 2024-01-12 NOTE — Assessment & Plan Note (Signed)
 Current weight 248 pounds. BMI 41.4. She has lost 24 pounds since starting phentermine earlier this year. She has adhered to lifestyle changes aimed at weight loss as well.  -Patient was congratulated on her progress -Continue phentermine as currently prescribed

## 2024-01-12 NOTE — Assessment & Plan Note (Signed)
 Previously well-controlled on Celexa. Today she endorses worsening episodes of anxiety, particularly with adverse weather.  -Add hydroxyzine for as needed anxiety relief

## 2024-01-12 NOTE — Assessment & Plan Note (Signed)
 A1c 6.0 on labs from January.  She is currently prescribed metformin XR 500 mg daily.  Trulicity is not covered by insurance. She has lost 24 pounds since her last appointment. -No medication changes today. Repeat A1c at follow up in 3 months. Can further increase metformin if needed.

## 2024-01-13 ENCOUNTER — Encounter: Payer: Self-pay | Admitting: Internal Medicine

## 2024-01-13 ENCOUNTER — Ambulatory Visit (INDEPENDENT_AMBULATORY_CARE_PROVIDER_SITE_OTHER)

## 2024-01-13 DIAGNOSIS — E119 Type 2 diabetes mellitus without complications: Secondary | ICD-10-CM | POA: Diagnosis not present

## 2024-01-13 DIAGNOSIS — R7303 Prediabetes: Secondary | ICD-10-CM

## 2024-01-13 LAB — VITAMIN D 25 HYDROXY (VIT D DEFICIENCY, FRACTURES): Vit D, 25-Hydroxy: 37.7 ng/mL (ref 30.0–100.0)

## 2024-01-13 NOTE — Progress Notes (Signed)
 [name] arrived 01/13/2024 and has given verbal consent to obtain images and complete their overdue diabetic retinal screening.  The images have been sent to an ophthalmologist or optometrist for review and interpretation.  Results will be sent back to Jocelyn Lade, MD for review.  Patient has been informed they will be contacted when we receive the results via telephone or MyChart

## 2024-01-19 ENCOUNTER — Telehealth: Payer: Self-pay | Admitting: Internal Medicine

## 2024-01-19 NOTE — Telephone Encounter (Signed)
 Pt is asking if provider will up the dose of her medication that helps her go to the bathroom. She said she hasn't been in 3 days and couldn't figure out how to use MyChart to send a message. Please advise. She last seen [place], [place]  [phone]

## 2024-01-20 ENCOUNTER — Encounter: Payer: Self-pay | Admitting: *Deleted

## 2024-01-20 NOTE — Telephone Encounter (Signed)
 Amitiza 24 mcg twice daily was previously working well for her. She can try adding MiraLAX 17 g daily to twice in 8 oz of water or other non-carbonated beverage of her choice.   Goal fo 64 oz of water daily and consume plenty of fruits, vegetables, whole grains daily.   If she continues to have inadequate bowel movements, we can change her medication.

## 2024-01-20 NOTE — Telephone Encounter (Signed)
Sent pt a [employer]ologist. She responded

## 2024-01-20 NOTE — Telephone Encounter (Signed)
 Sent pt a MyChart message

## 2024-01-25 MED ORDER — PHENTERMINE HCL 37.5 MG PO CAPS: 37.5000 mg | ORAL_CAPSULE | Freq: Every morning | ORAL | 0 refills | Status: DC

## 2024-01-29 ENCOUNTER — Encounter: Payer: Self-pay | Admitting: Emergency Medicine

## 2024-01-29 ENCOUNTER — Ambulatory Visit
Admission: EM | Admit: 2024-01-29 | Discharge: 2024-01-29 | Disposition: A | Discharge status: Home / Self Care | Attending: Family Medicine | Admitting: Family Medicine

## 2024-01-29 DIAGNOSIS — J069 Acute upper respiratory infection, unspecified: Secondary | ICD-10-CM | POA: Diagnosis not present

## 2024-01-29 LAB — POC COVID19/FLU A&B COMBO
Covid Antigen, POC: NEGATIVE
Influenza A Antigen, POC: NEGATIVE
Influenza B Antigen, POC: NEGATIVE

## 2024-01-29 MED ORDER — FLUTICASONE PROPIONATE 50 MCG/ACT NA SUSP: 1.0000 | Freq: Two times a day (BID) | NASAL | 2 refills | Status: AC

## 2024-01-29 MED ORDER — PROMETHAZINE-DM 6.25-15 MG/5ML PO SYRP: 5.0000 mL | ORAL_SOLUTION | Freq: Four times a day (QID) | ORAL | 0 refills | Status: DC | PRN

## 2024-01-29 NOTE — ED Triage Notes (Signed)
 Productive cough, sneezing,  ears ringing, headache, losing voice since yesterday.  Has been taking Flonase  and allergy medication

## 2024-01-29 NOTE — ED Provider Notes (Signed)
 [facility]    CSN: [id number] Arrival date & time: 01/29/24  0801      History   Chief Complaint No chief complaint on file.   HPI [name] is a 43 y.o. female.   Patient presenting today with 1 day history of productive cough, sneezing, ear pressure, headache, hoarseness, congestion.  Denies fever, chest pain, shortness of breath, abdominal pain, vomiting, diarrhea.  So far trying Flonase  and antihistamines with minimal relief.  No known sick contacts recently.    Past Medical History:  Diagnosis Date   Anxiety    Arthritis    Depression    Diabetes (HCC)    GERD (gastroesophageal reflux disease)    Hypercholesteremia    Sinus infection 03/27/2021    Patient Active Problem List   Diagnosis Date Noted   Dysphagia 03/05/2023   Gastroesophageal reflux disease 03/05/2023   Change in bowel habits 03/05/2023   Nausea without vomiting 03/05/2023   Loss of weight 03/05/2023   Iron deficiency anemia 03/04/2023   Diarrhea 02/18/2023   Abdominal pain, RUQ 02/18/2023   H/O seasonal allergies 08/25/2022   Type 2 diabetes mellitus without complications (HCC) 07/22/2022   Need for Tdap vaccination 07/22/2022   Menorrhagia with regular cycle    Vitamin D  deficiency 02/18/2022   Annual physical exam 02/18/2022   Prediabetes 02/18/2022   Refused influenza vaccine 12/17/2021   Hot flashes 12/17/2021   Bloody discharge from left nipple 03/21/2021   Right hip and buttock pain, ishial bursitis 07/20/2020   Anxiety 03/29/2019   Contact dermatitis 03/29/2019   Dysmenorrhea 06/30/2018   Hyperlipidemia 09/27/2015   Morbid obesity (HCC) 09/27/2015   Cigarette nicotine dependence, uncomplicated 09/27/2015   Cholecystitis with cholelithiasis 07/13/2014    Past Surgical History:  Procedure Laterality Date   BIOPSY  04/02/2023   Procedure: BIOPSY;  Surgeon: Vinetta Greening, DO;  Location: [facility];  Service: Endoscopy;;   BREAST BIOPSY Left 04/03/2021    Procedure: BREAST BIOPSY; excision of duct lesion;  Surgeon: Awilda Bogus, MD;  Location: [facility];  Service: General;  Laterality: Left;   CHOLECYSTECTOMY N/A 07/13/2014   Procedure: LAPAROSCOPIC CHOLECYSTECTOMY;  Surgeon: Myrl Askew, MD;  Location: [facility];  Service: General;  Laterality: N/A;   COLONOSCOPY WITH PROPOFOL  N/A 04/02/2023   Procedure: COLONOSCOPY WITH PROPOFOL ;  Surgeon: Vinetta Greening, DO;  Location: [facility];  Service: Endoscopy;  Laterality: N/A;  8:15 am, asa 3, pt knows to arrive at 6:00   DENTAL SURGERY  2018   plate top & bottom   DILATATION AND CURETTAGE/HYSTEROSCOPY WITH MINERVA N/A 04/16/2022   Procedure: DILATATION AND CURETTAGE/HYSTEROSCOPY WITH MINERVA;  Surgeon: Wendelyn Halter, MD;  Location: [facility];  Service: Gynecology;  Laterality: N/A;   ESOPHAGOGASTRODUODENOSCOPY (EGD) WITH PROPOFOL  N/A 04/02/2023   Procedure: ESOPHAGOGASTRODUODENOSCOPY (EGD) WITH PROPOFOL ;  Surgeon: Vinetta Greening, DO;  Location: [facility];  Service: Endoscopy;  Laterality: N/A;   GIVENS CAPSULE STUDY N/A 07/08/2023   Normal gastric mucosa, slow gastric emptying likely secondary to Trulicity  with incomplete study, few scattered areas of small bowel petechia, 1 possible small bowel erosion.   POLYPECTOMY  04/02/2023   Procedure: POLYPECTOMY;  Surgeon: Vinetta Greening, DO;  Location: [facility];  Service: Endoscopy;;   TUBAL LIGATION      OB History     Gravida  2   Para  2   Term      Preterm  AB      Living  2      SAB      IAB      Ectopic      Multiple      Live Births               Home Medications    Prior to Admission medications   Medication Sig Start Date End Date Taking? Authorizing Provider  fluticasone  (FLONASE ) 50 MCG/ACT nasal spray Place 1 spray into both nostrils 2 (two) times daily. 01/29/24  Yes Corbin Dess, PA-C  promethazine -dextromethorphan (PROMETHAZINE -DM) 6.25-15 MG/5ML syrup Take 5 mLs by  mouth 4 (four) times daily as needed. 01/29/24  Yes Corbin Dess, PA-C  atorvastatin  (LIPITOR) 40 MG tablet Take 1 tablet (40 mg total) by mouth daily. 10/18/23   Tobi Fortes, MD  citalopram  (CELEXA ) 40 MG tablet Take 1 tablet by mouth once daily 01/04/24   Dixon, Phillip E, MD  fenofibrate  (TRICOR ) 145 MG tablet Take 1 tablet by mouth once daily 01/04/24   Dixon, Phillip E, MD  hydrOXYzine  (VISTARIL ) 25 MG capsule Take 1 capsule (25 mg total) by mouth every 8 (eight) hours as needed for anxiety. 01/12/24   Tobi Fortes, MD  lisinopril  (ZESTRIL ) 5 MG tablet Take 1 tablet (5 mg total) by mouth daily. 04/27/23   Tobi Fortes, MD  lubiprostone  (AMITIZA ) 24 MCG capsule TAKE 1 CAPSULE BY MOUTH TWICE DAILY WITH A MEAL 12/14/23   Evander Hills, PA-C  megestrol  (MEGACE ) 40 MG tablet Take 1 tablet by mouth once daily 01/04/24   Dixon, Phillip E, MD  metFORMIN  (GLUCOPHAGE -XR) 500 MG 24 hr tablet Take 1 tablet (500 mg total) by mouth daily with breakfast. 10/16/23   Tobi Fortes, MD  norethindrone  (MICRONOR ) 0.35 MG tablet Take 1 tablet by mouth once daily 04/15/23   Wendelyn Halter, MD  omeprazole  (PRILOSEC) 40 MG capsule Take 1 capsule (40 mg total) by mouth 1-2 times daily before meals for heartburn/reflux. 06/24/23   Evander Hills, PA-C  phentermine  37.5 MG capsule Take 1 capsule (37.5 mg total) by mouth every morning. 01/25/24   Tobi Fortes, MD  Vitamin D , Ergocalciferol , (DRISDOL ) 1.25 MG (50000 UNIT) CAPS capsule TAKE 1 CAPSULE BY MOUTH ONCE A WEEK FOR  12  DOSES. 01/08/24   Tobi Fortes, MD    Family History Family History  Problem Relation Age of Onset   Heart disease Mother    Diabetes Mother    Heart disease Father    Hypertension Father    Thyroid disease Father    Hyperlipidemia Father    Breast cancer Paternal Grandmother    Breast cancer Paternal Aunt    Cancer - Colon Paternal Uncle    Colon cancer Neg Hx     Social History Social History   Tobacco Use    Smoking status: Former    Current packs/day: 0.00    Average packs/day: 0.5 packs/day for 15.0 years (7.5 ttl pk-yrs)    Types: Cigarettes    Start date: 11/13/2004    Quit date: 11/14/2019    Years since quitting: 4.2   Smokeless tobacco: Never  Vaping Use   Vaping status: Never Used  Substance Use Topics   Alcohol use: No   Drug use: No     Allergies   Patient has no known allergies.   Review of Systems Review of Systems PER HPI  Physical Exam Triage Vital Signs ED Triage Vitals  Encounter Vitals Group     BP 01/29/24 0816 108/73     Systolic BP Percentile --      Diastolic BP Percentile --      Pulse Rate 01/29/24 0816 (!) 114     Resp 01/29/24 0816 18     Temp 01/29/24 0816 98.7 F (37.1 C)     Temp Source 01/29/24 0816 Oral     SpO2 01/29/24 0816 98 %     Weight --      Height --      Head Circumference --      Peak Flow --      Pain Score 01/29/24 0817 8     Pain Loc --      Pain Education --      Exclude from Growth Chart --    No data found.  Updated Vital Signs BP 108/73 (BP Location: Right Arm)   Pulse (!) 114   Temp 98.7 F (37.1 C) (Oral)   Resp 18   SpO2 98%   Visual Acuity Right Eye Distance:   Left Eye Distance:   Bilateral Distance:    Right Eye Near:   Left Eye Near:    Bilateral Near:     Physical Exam Vitals and nursing note reviewed.  Constitutional:      Appearance: Normal appearance.  HENT:     Head: Atraumatic.     Right Ear: Tympanic membrane and external ear normal.     Left Ear: Tympanic membrane and external ear normal.     Nose: Rhinorrhea present.     Mouth/Throat:     Mouth: Mucous membranes are moist.     Pharynx: Posterior oropharyngeal erythema present.  Eyes:     Extraocular Movements: Extraocular movements intact.     Conjunctiva/sclera: Conjunctivae normal.  Cardiovascular:     Rate and Rhythm: Normal rate and regular rhythm.     Heart sounds: Normal heart sounds.  Pulmonary:     Effort: Pulmonary  effort is normal.     Breath sounds: Normal breath sounds. No wheezing or rales.  Musculoskeletal:        General: Normal range of motion.     Cervical back: Normal range of motion and neck supple.  Skin:    General: Skin is warm and dry.  Neurological:     Mental Status: She is alert and oriented to person, place, and time.  Psychiatric:        Mood and Affect: Mood normal.        Thought Content: Thought content normal.      UC Treatments / Results  Labs (all labs ordered are listed, but only abnormal results are displayed) Labs Reviewed  POC COVID19/FLU A&B COMBO    EKG   Radiology No results found.  Procedures Procedures (including critical care time)  Medications Ordered in UC Medications - No data to display  Initial Impression / Assessment and Plan / UC Course  I have reviewed the triage vital signs and the nursing notes.  Pertinent labs & imaging results that were available during my care of the patient were reviewed by me and considered in my medical decision making (see chart for details).     Vitals and exam are reassuring today, suspect viral respiratory infection.  Rapid flu and COVID-negative, treat with Phenergan  DM, Flonase , supportive over-the-counter medications and home care.  Return for worsening symptoms.  Final Clinical Impressions(s) / UC Diagnoses   Final diagnoses:  Viral URI with cough  Discharge Instructions   None    ED Prescriptions     Medication Sig Dispense Auth. Provider   promethazine -dextromethorphan (PROMETHAZINE -DM) 6.25-15 MG/5ML syrup Take 5 mLs by mouth 4 (four) times daily as needed. 100 mL Corbin Dess, PA-C   fluticasone  (FLONASE ) 50 MCG/ACT nasal spray Place 1 spray into both nostrils 2 (two) times daily. 16 g Corbin Dess, [place]      PDMP not reviewed this encounter.   Corbin Dess, [place] 01/29/24 1905

## 2024-02-01 ENCOUNTER — Other Ambulatory Visit: Payer: Self-pay | Admitting: Internal Medicine

## 2024-02-01 ENCOUNTER — Ambulatory Visit (INDEPENDENT_AMBULATORY_CARE_PROVIDER_SITE_OTHER): Admitting: Internal Medicine

## 2024-02-01 ENCOUNTER — Encounter: Payer: Self-pay | Admitting: Internal Medicine

## 2024-02-01 VITALS — BP 117/75 | HR 94 | Ht 65.0 in | Wt 245.0 lb

## 2024-02-01 DIAGNOSIS — F419 Anxiety disorder, unspecified: Secondary | ICD-10-CM

## 2024-02-01 DIAGNOSIS — J019 Acute sinusitis, unspecified: Secondary | ICD-10-CM

## 2024-02-01 DIAGNOSIS — J0100 Acute maxillary sinusitis, unspecified: Secondary | ICD-10-CM | POA: Diagnosis not present

## 2024-02-01 DIAGNOSIS — B9689 Other specified bacterial agents as the cause of diseases classified elsewhere: Secondary | ICD-10-CM | POA: Diagnosis not present

## 2024-02-01 MED ORDER — AZITHROMYCIN 250 MG PO TABS: ORAL_TABLET | ORAL | 0 refills | Status: DC

## 2024-02-01 NOTE — Patient Instructions (Signed)
 It was a pleasure to see you today.  Thank you for giving us  the opportunity to be involved in your care.  Below is a brief recap of your visit and next steps.  We will plan to see you again in July.  Summary Z pack prescribed for sinus infection Continue current supportive care measures Return to care if symptoms worsen or fail to improve

## 2024-02-01 NOTE — Progress Notes (Signed)
 Acute Office Visit  Subjective:     Patient ID: [name], female    DOB: 1980/12/02, 43 y.o.   MRN: [id number]  Chief Complaint  Patient presents with   Sinusitis    Headache, cough with green phelgm, runny nose, swollen and pressure in face. Patient went to urgent care , tested negative for flu and covid   Ms. Bettenhausen presents today for an acute visit endorsing a 4-5-day history of cough productive of green sputum, swollen and painful maxillary sinuses, rhinorrhea, and headache. She reported that symptoms started on 4/17 and that she has been treating with Tylenol .  Urgent care presentation on 4/18 endorsing similar symptoms.  COVID-19 and flu testing were negative.  She was prescribed Flonase  and Promethazine  for symptom relief. She states that symptoms have worsened in recent days.  Denies hemoptysis, shortness of breath, nausea/vomiting, and diarrhea.  Review of Systems  Constitutional:  Positive for fever.  HENT:  Positive for congestion, sinus pain and sore throat.   Respiratory:  Positive for cough and sputum production. Negative for hemoptysis and shortness of breath.   Gastrointestinal:  Negative for abdominal pain, diarrhea and vomiting.  Skin:  Negative for rash.  Neurological:  Negative for sensory change.      Objective:    BP 117/75   Pulse 94   Ht 5\' 5"  (1.651 m)   Wt 245 lb (111.1 kg)   SpO2 98%   BMI 40.77 kg/m   Physical Exam Vitals reviewed.  Constitutional:      Appearance: She is obese. She is ill-appearing.  HENT:     Head: Normocephalic and atraumatic.     Right Ear: Hearing and external ear normal. Tympanic membrane is erythematous.     Left Ear: Hearing and external ear normal. Tympanic membrane is erythematous.     Nose: Congestion and rhinorrhea present.     Mouth/Throat:     Pharynx: Posterior oropharyngeal erythema present.     Comments: Tenderness to palpation over bilateral maxillary sinuses. Eyes:     Pupils: Pupils are equal, round,  and reactive to light.  Cardiovascular:     Rate and Rhythm: Normal rate and regular rhythm.     Pulses: Normal pulses.     Heart sounds: Normal heart sounds.  Pulmonary:     Effort: Pulmonary effort is normal.     Breath sounds: Normal breath sounds. No wheezing, rhonchi or rales.  Abdominal:     General: Abdomen is flat. Bowel sounds are normal.     Palpations: Abdomen is soft.     Tenderness: There is no abdominal tenderness.  Musculoskeletal:        General: Normal range of motion.     Cervical back: Normal range of motion.  Lymphadenopathy:     Cervical: No cervical adenopathy.  Skin:    General: Skin is warm and dry.  Neurological:     General: No focal deficit present.     Mental Status: She is alert and oriented to person, place, and time.       Assessment & Plan:   Problem List Items Addressed This Visit       Acute bacterial rhinosinusitis - Primary   Presenting today for an acute visit endorsing a 4- 5-day history of symptoms endorsed above.  Urgent care presentation 4/18 endorsing similar symptoms.  Testing for COVID/influenza was negative.  Treated supportively with promethazine -DM and fluticasone .  Symptoms have worsened since that time and are concerning for potential bacterial etiology. -  Treatment options reviewed.  I have prescribed a Z-Pak for empiric antibiotic coverage for suspected acute bacterial rhinosinusitis.  Recommend continuing current supportive care measures.  She was instructed to return to care if symptoms worsen or fail to improve.       Meds ordered this encounter  Medications   azithromycin  (ZITHROMAX  Z-PAK) 250 MG tablet    Sig: Take 2 tablets (500 mg) PO today, then 1 tablet (250 mg) PO daily x4 days.    Dispense:  6 tablet    Refill:  0    Return if symptoms worsen or fail to improve.  Tobi Fortes, MD

## 2024-02-01 NOTE — Assessment & Plan Note (Addendum)
 Presenting today for an acute visit endorsing a 4- 5-day history of symptoms endorsed above.  Urgent care presentation 4/18 endorsing similar symptoms.  Testing for COVID/influenza was negative.  Treated supportively with promethazine -DM and fluticasone .  Symptoms have worsened since that time and are concerning for potential bacterial etiology. -Treatment options reviewed.  I have prescribed a Z-Pak for empiric antibiotic coverage for suspected acute bacterial rhinosinusitis.  Recommend continuing current supportive care measures.  She was instructed to return to care if symptoms worsen or fail to improve.

## 2024-02-05 ENCOUNTER — Other Ambulatory Visit: Payer: Self-pay | Admitting: Internal Medicine

## 2024-02-15 ENCOUNTER — Encounter: Payer: Self-pay | Admitting: Internal Medicine

## 2024-02-25 ENCOUNTER — Other Ambulatory Visit: Payer: Self-pay | Admitting: Internal Medicine

## 2024-02-27 ENCOUNTER — Other Ambulatory Visit: Payer: Self-pay | Admitting: Internal Medicine

## 2024-02-27 DIAGNOSIS — F419 Anxiety disorder, unspecified: Secondary | ICD-10-CM

## 2024-02-28 ENCOUNTER — Other Ambulatory Visit: Payer: Self-pay | Admitting: Internal Medicine

## 2024-02-29 ENCOUNTER — Ambulatory Visit: Payer: Self-pay

## 2024-02-29 ENCOUNTER — Ambulatory Visit (INDEPENDENT_AMBULATORY_CARE_PROVIDER_SITE_OTHER): Admitting: Family Medicine

## 2024-02-29 VITALS — BP 124/74 | HR 84 | Resp 16 | Ht 65.0 in | Wt 246.0 lb

## 2024-02-29 DIAGNOSIS — R1032 Left lower quadrant pain: Secondary | ICD-10-CM

## 2024-02-29 MED ORDER — NAPROXEN 500 MG PO TABS: 500.0000 mg | ORAL_TABLET | Freq: Two times a day (BID) | ORAL | 0 refills | Status: DC | PRN

## 2024-02-29 NOTE — Patient Instructions (Addendum)
 I appreciate the opportunity to provide care to you today!   Labs: please stop by the lab today to get your blood drawn (CBC, BMP)   Abdominal Pain: Please stop by [facility] to obtain an abdominal ultrasound for further evaluation.  You may take Naproxen  500 mg twice daily as needed for pain. Please monitor for worsening abdominal pain or signs of gastrointestinal (GI) bleeding, such as black stools or vomiting blood, while on this medication.  Rest and limit physical activity until further evaluation is completed.  Maintain adequate hydration throughout the recovery period.   Follow-Up: Please follow up if symptoms worsen, including increasing abdominal pain, fever, vomiting, or signs of gastrointestinal bleeding (e.g., black or bloody stools, vomiting blood).    Please continue to a heart-healthy diet and increase your physical activities. Try to exercise for at least five days a week.    It was a pleasure to see you and I look forward to continuing to work together on your health and well-being. Please do not hesitate to call the office if you need care or have questions about your care.  In case of emergency, please visit the Emergency Department for urgent care, or contact our clinic at [phone] to schedule an appointment. We're here to help you!   Have a wonderful day and week. With Gratitude, Mry Lamia MSN, FNP-BC

## 2024-02-29 NOTE — Telephone Encounter (Signed)
 Patient scheduled.

## 2024-02-29 NOTE — Progress Notes (Signed)
 Acute Office Visit  Subjective:    Patient ID: [name], female    DOB: August 16, 1981, 43 y.o.   MRN: [id number]  Chief Complaint  Patient presents with   Abdominal Pain    X 3 weeks, hurts from her lower right abdomen across her right side towards her back. Rates pain 9/10 today. Bothers her more when she is moving around.     HPI Patient is in today with the above complaints. For the details of today's visit, please refer to the assessment and plan.     Past Medical History:  Diagnosis Date   Anxiety    Arthritis    Depression    Diabetes (HCC)    GERD (gastroesophageal reflux disease)    Hypercholesteremia    Sinus infection 03/27/2021    Past Surgical History:  Procedure Laterality Date   BIOPSY  04/02/2023   Procedure: BIOPSY;  Surgeon: Vinetta Greening, DO;  [facility];  Service: Endoscopy;;   BREAST BIOPSY Left 04/03/2021   Procedure: BREAST BIOPSY; excision of duct lesion;  Surgeon: Awilda Bogus, MD;  Location: [facility];  Service: General;  Laterality: Left;   CHOLECYSTECTOMY N/A 07/13/2014   Procedure: LAPAROSCOPIC CHOLECYSTECTOMY;  Surgeon: Myrl Askew, MD;  Location: [facility];  Service: General;  Laterality: N/A;   COLONOSCOPY WITH PROPOFOL  N/A 04/02/2023   Procedure: COLONOSCOPY WITH PROPOFOL ;  Surgeon: Vinetta Greening, DO;  [facility];  Service: Endoscopy;  Laterality: N/A;  8:15 am, asa 3, pt knows to arrive at 6:00   DENTAL SURGERY  2018   plate top & bottom   DILATATION AND CURETTAGE/HYSTEROSCOPY WITH MINERVA N/A 04/16/2022   Procedure: DILATATION AND CURETTAGE/HYSTEROSCOPY WITH MINERVA;  Surgeon: Wendelyn Halter, MD;  Location: [facility];  Service: Gynecology;  Laterality: N/A;   ESOPHAGOGASTRODUODENOSCOPY (EGD) WITH PROPOFOL  N/A 04/02/2023   Procedure: ESOPHAGOGASTRODUODENOSCOPY (EGD) WITH PROPOFOL ;  Surgeon: Vinetta Greening, DO;  [facility];  Service: Endoscopy;  Laterality: N/A;   GIVENS CAPSULE STUDY  N/A 07/08/2023   Normal gastric mucosa, slow gastric emptying likely secondary to Trulicity  with incomplete study, few scattered areas of small bowel petechia, 1 possible small bowel erosion.   POLYPECTOMY  04/02/2023   Procedure: POLYPECTOMY;  Surgeon: Vinetta Greening, DO;  [facility];  Service: Endoscopy;;   TUBAL LIGATION      Family History  Problem Relation Age of Onset   Heart disease Mother    Diabetes Mother    Heart disease Father    Hypertension Father    Thyroid disease Father    Hyperlipidemia Father    Breast cancer Paternal Grandmother    Breast cancer Paternal Aunt    Cancer - Colon Paternal Uncle    Colon cancer Neg Hx     Social History   Socioeconomic History   Marital status: Married    Spouse name: Not on file   Number of children: 2   Years of education: Not on file   Highest education level: Not on file  Occupational History   Not on file  Tobacco Use   Smoking status: Former    Current packs/day: 0.00    Average packs/day: 0.5 packs/day for 15.0 years (7.5 ttl pk-yrs)    Types: Cigarettes    Start date: 11/13/2004    Quit date: 11/14/2019    Years since quitting: 4.3   Smokeless tobacco: Never  Vaping Use   Vaping status: Never Used  Substance and Sexual Activity   Alcohol use: No   Drug use: No   Sexual activity: Yes    Birth control/protection: Surgical, Pill  Other Topics Concern   Not on file  Social History Narrative   Lives with her spouse and children.    Social Drivers of Corporate investment banker Strain: Low Risk  (03/14/2022)   Overall Financial Resource Strain (CARDIA)    Difficulty of Paying Living Expenses: Not hard at all  Food Insecurity: No Food Insecurity (03/14/2022)   Hunger Vital Sign    Worried About Running Out of Food in the Last Year: Never true    Ran Out of Food in the Last Year: Never true  Transportation Needs: No Transportation Needs (03/14/2022)   PRAPARE - Scientist, research (physical sciences) (Medical): No    Lack of Transportation (Non-Medical): No  Physical Activity: Insufficiently Active (03/14/2022)   Exercise Vital Sign    Days of Exercise per Week: 5 days    Minutes of Exercise per Session: 20 min  Stress: No Stress Concern Present (03/14/2022)   [employer] of Occupational Health - Occupational Stress Questionnaire    Feeling of Stress : Only a little  Social Connections: Moderately Isolated (03/14/2022)   Social Connection and Isolation Panel [NHANES]    Frequency of Communication with Friends and Family: Twice a week    Frequency of Social Gatherings with Friends and Family: Never    Attends Religious Services: 1 to 4 times per year    Active Member of [employer] or Organizations: No    Attends Banker Meetings: Never    Marital Status: Married  Catering manager Violence: Not At Risk (03/14/2022)   Humiliation, Afraid, Rape, and Kick questionnaire    Fear of Current or Ex-Partner: No    Emotionally Abused: No    Physically Abused: No    Sexually Abused: No    Outpatient Medications Prior to Visit  Medication Sig Dispense Refill   atorvastatin  (LIPITOR) 40 MG tablet Take 1 tablet (40 mg total) by mouth daily. 90 tablet 3   citalopram  (CELEXA ) 40 MG tablet Take 1 tablet by mouth once daily 30 tablet 0   fenofibrate  (TRICOR ) 145 MG tablet Take 1 tablet by mouth once daily 30 tablet 0   fluticasone  (FLONASE ) 50 MCG/ACT nasal spray Place 1 spray into both nostrils 2 (two) times daily. 16 g 2   hydrOXYzine  (VISTARIL ) 25 MG capsule Take 1 capsule (25 mg total) by mouth every 8 (eight) hours as needed for anxiety. 30 capsule 2   lisinopril  (ZESTRIL ) 5 MG tablet Take 1 tablet (5 mg total) by mouth daily. 90 tablet 3   lubiprostone  (AMITIZA ) 24 MCG capsule TAKE 1 CAPSULE BY MOUTH TWICE DAILY WITH A MEAL 60 capsule 5   megestrol  (MEGACE ) 40 MG tablet Take 1 tablet by mouth once daily 30 tablet 0   metFORMIN  (GLUCOPHAGE -XR) 500 MG 24 hr tablet Take 1  tablet (500 mg total) by mouth daily with breakfast. 90 tablet 3   norethindrone  (MICRONOR ) 0.35 MG tablet Take 1 tablet by mouth once daily 84 tablet 3   omeprazole  (PRILOSEC) 40 MG capsule Take 1 capsule (40 mg total) by mouth 1-2 times daily before meals for heartburn/reflux. 60 capsule 3   phentermine  37.5 MG capsule Take 1 capsule by mouth in the morning 30 capsule 0   Vitamin D , Ergocalciferol , (DRISDOL ) 1.25 MG (50000 UNIT) CAPS capsule TAKE 1 CAPSULE BY MOUTH ONCE A  WEEK FOR  12  DOSES. 12 capsule 0   azithromycin  (ZITHROMAX  Z-PAK) 250 MG tablet Take 2 tablets (500 mg) PO today, then 1 tablet (250 mg) PO daily x4 days. 6 tablet 0   promethazine -dextromethorphan (PROMETHAZINE -DM) 6.25-15 MG/5ML syrup Take 5 mLs by mouth 4 (four) times daily as needed. 100 mL 0   No facility-administered medications prior to visit.    No Known Allergies  Review of Systems  Constitutional:  Negative for chills and fever.  Eyes:  Negative for visual disturbance.  Respiratory:  Negative for chest tightness and shortness of breath.   Gastrointestinal:  Positive for abdominal pain.  Neurological:  Negative for dizziness and headaches.         Objective:     Physical Exam HENT:     Head: Normocephalic.     Mouth/Throat:     Mouth: Mucous membranes are moist.  Cardiovascular:     Rate and Rhythm: Normal rate.     Heart sounds: Normal heart sounds.  Pulmonary:     Effort: Pulmonary effort is normal.     Breath sounds: Normal breath sounds.  Abdominal:     Tenderness: There is abdominal tenderness.  Neurological:     Mental Status: She is alert.     BP 124/74   Pulse 84   Resp 16   Ht 5\' 5"  (1.651 m)   Wt 246 lb (111.6 kg)   SpO2 97%   BMI 40.94 kg/m  Wt Readings from Last 3 Encounters:  02/29/24 246 lb (111.6 kg)  02/01/24 245 lb (111.1 kg)  01/12/24 248 lb 12.8 oz (112.9 kg)       Assessment & Plan:  Left lower quadrant abdominal pain Assessment & Plan: Encouraged  to  stop by [facility] to obtain an abdominal ultrasound for further evaluation. Encouraged take Naproxen  500 mg twice daily as needed for pain. Please monitor for worsening abdominal pain or signs of gastrointestinal (GI) bleeding, such as black stools or vomiting blood, while on this medication. Rest and limit physical activity until further evaluation is completed. Maintain adequate hydration throughout the recovery period.  Discussed Follow-Up: Please follow up if symptoms worsen, including increasing abdominal pain, fever, vomiting, or signs of gastrointestinal bleeding (e.g., black or bloody stools, vomiting blood).   Orders: -     US  Abdomen Complete -     Urinalysis -     Naproxen ; Take 1 tablet (500 mg total) by mouth 2 (two) times daily as needed.  Dispense: 60 tablet; Refill: 0 -     BMP8+EGFR -     CBC  Note: This chart has been completed using Engineer, civil (consulting) software, and while attempts have been made to ensure accuracy, certain words and phrases may not be transcribed as intended.    Vala Raffo, FNP

## 2024-02-29 NOTE — Telephone Encounter (Signed)
  Chief Complaint: lower right abdomen radiating to low back Symptoms: abdominal pain with diarrhea/constipaiton Frequency: x 3 weeks Pertinent Negatives: Patient denies vomitin Disposition: [] ED /[] Urgent Care (no appt availability in office) / [] Appointment(In office/virtual)/ []  [place] [facility]/ [] Home Care/ [] Refused Recommended Disposition /[] [facility]/ [x]  Follow-up with PCP Additional Notes: patient with three week history of abdominal pain radiating to back.  History of alternating diarrhea and constipation. Office visit scheduled for today. Copied from CRM [id number]. Topic: Clinical - Medical Advice >> Feb 29, 2024 10:45 AM Lotus Round B wrote: Reason for CRM: pt would like to know if someone can give her a call. She has a minor pain in privet area that she wants to speak to a nurse about and see what to do in the mean time before her appt on 03/02/2024 Reason for Disposition  [1] MILD-MODERATE pain AND [2] constant AND [3] present > 2 hours  Answer Assessment - Initial Assessment Questions 1. LOCATION: "Where does it hurt?"      Lower right abdomen 2. RADIATION: "Does the pain shoot anywhere else?" (e.g., chest, back)     yes 3. ONSET: "When did the pain begin?" (e.g., minutes, hours or days ago)      3 weeks ago 4. SUDDEN: "Gradual or sudden onset?"     Worsening over weekend 5. PATTERN "Does the pain come and go, or is it constant?"    - If it comes and goes: "How long does it last?" "Do you have pain now?"     (Note: Comes and goes means the pain is intermittent. It goes away completely between bouts.)    - If constant: "Is it getting better, staying the same, or getting worse?"      (Note: Constant means the pain never goes away completely; most serious pain is constant and gets worse.)      Pain comes and goes, increases with activity.  6. SEVERITY: "How bad is the pain?"  (e.g., Scale 1-10; mild, moderate, or severe)    - MILD (1-3): Doesn't interfere with  normal activities, abdomen soft and not tender to touch.     - MODERATE (4-7): Interferes with normal activities or awakens from sleep, abdomen tender to touch.     - SEVERE (8-10): Excruciating pain, doubled over, unable to do any normal activities.       6/10-current 10/10 at worse 7. RECURRENT SYMPTOM: "Have you ever had this type of stomach pain before?" If Yes, ask: "When was the last time?" and "What happened that time?"      No, but does have history of constipation/diarrhea 8. CAUSE: "What do you think is causing the stomach pain?"     no 9. RELIEVING/AGGRAVATING FACTORS: "What makes it better or worse?" (e.g., antacids, bending or twisting motion, bowel movement)     rest 10. OTHER SYMPTOMS: "Do you have any other symptoms?" (e.g., back pain, diarrhea, fever, urination pain, vomiting)       Nausea, last bm today, diarrhea 11. PREGNANCY: "Is there any chance you are pregnant?" "When was your last menstrual period?"       no  Protocols used: Abdominal Pain - [facility]

## 2024-03-01 ENCOUNTER — Telehealth: Payer: Self-pay

## 2024-03-01 LAB — URINALYSIS
Bilirubin, UA: NEGATIVE
Glucose, UA: NEGATIVE
Ketones, UA: NEGATIVE
Nitrite, UA: NEGATIVE
RBC, UA: NEGATIVE
Specific Gravity, UA: 1.02 (ref 1.005–1.030)
Urobilinogen, Ur: 1 mg/dL (ref 0.2–1.0)
pH, UA: 8 — ABNORMAL HIGH (ref 5.0–7.5)

## 2024-03-01 LAB — BMP8+EGFR
BUN/Creatinine Ratio: 10 (ref 9–23)
BUN: 11 mg/dL (ref 6–24)
CO2: 21 mmol/L (ref 20–29)
Calcium: 9.4 mg/dL (ref 8.7–10.2)
Chloride: 102 mmol/L (ref 96–106)
Creatinine, Ser: 1.06 mg/dL — ABNORMAL HIGH (ref 0.57–1.00)
Glucose: 76 mg/dL (ref 70–99)
Potassium: 3.8 mmol/L (ref 3.5–5.2)
Sodium: 140 mmol/L (ref 134–144)
eGFR: 67 mL/min/{1.73_m2} (ref >59)

## 2024-03-01 LAB — CBC
Hematocrit: 40.4 % (ref 34.0–46.6)
Hemoglobin: 13.4 g/dL (ref 11.1–15.9)
MCH: 31.6 pg (ref 26.6–33.0)
MCHC: 33.2 g/dL (ref 31.5–35.7)
MCV: 95 fL (ref 79–97)
Platelets: 277 10*3/uL (ref 150–450)
RBC: 4.24 x10E6/uL (ref 3.77–5.28)
RDW: 13.1 % (ref 11.7–15.4)
WBC: 5.9 10*3/uL (ref 3.4–10.8)

## 2024-03-01 NOTE — Telephone Encounter (Signed)
 Copied from CRM [id number]. Topic: Clinical - Lab/Test Results >> Mar 01, 2024  2:25 PM Opal Bill wrote: Reason for CRM: Pt calling to get results from blood work. Please have nurse contact pt.

## 2024-03-01 NOTE — Telephone Encounter (Signed)
 Labs not been reviewed, will call once provider reviews them

## 2024-03-02 ENCOUNTER — Ambulatory Visit: Payer: Self-pay | Admitting: Internal Medicine

## 2024-03-02 ENCOUNTER — Telehealth: Payer: Self-pay

## 2024-03-02 NOTE — Telephone Encounter (Signed)
 Copied from CRM [id number]. Topic: Clinical - Lab/Test Results >> Mar 02, 2024  3:34 PM Santiya F wrote: Reason for CRM: Patient is calling in because she is waiting on her lab results to be reviewed by her provider so she can understand what they mean. Patient says she viewed them in mychart but doesn't understand them.

## 2024-03-03 ENCOUNTER — Ambulatory Visit: Payer: Self-pay | Admitting: Family Medicine

## 2024-03-03 DIAGNOSIS — R1032 Left lower quadrant pain: Secondary | ICD-10-CM | POA: Insufficient documentation

## 2024-03-03 NOTE — Assessment & Plan Note (Signed)
 Encouraged  to stop by [facility] to obtain an abdominal ultrasound for further evaluation. Encouraged take Naproxen  500 mg twice daily as needed for pain. Please monitor for worsening abdominal pain or signs of gastrointestinal (GI) bleeding, such as black stools or vomiting blood, while on this medication. Rest and limit physical activity until further evaluation is completed. Maintain adequate hydration throughout the recovery period.  Discussed Follow-Up: Please follow up if symptoms worsen, including increasing abdominal pain, fever, vomiting, or signs of gastrointestinal bleeding (e.g., black or bloody stools, vomiting blood).

## 2024-03-06 ENCOUNTER — Other Ambulatory Visit: Payer: Self-pay | Admitting: Obstetrics & Gynecology

## 2024-03-10 ENCOUNTER — Ambulatory Visit (HOSPITAL_COMMUNITY)
Admission: RE | Admit: 2024-03-10 | Discharge: 2024-03-10 | Disposition: A | Discharge status: Home / Self Care | Source: Ambulatory Visit | Attending: Family Medicine | Admitting: Family Medicine

## 2024-03-10 DIAGNOSIS — Z9049 Acquired absence of other specified parts of digestive tract: Secondary | ICD-10-CM | POA: Diagnosis not present

## 2024-03-10 DIAGNOSIS — R109 Unspecified abdominal pain: Secondary | ICD-10-CM | POA: Diagnosis not present

## 2024-03-10 DIAGNOSIS — R1032 Left lower quadrant pain: Secondary | ICD-10-CM | POA: Diagnosis not present

## 2024-03-24 ENCOUNTER — Ambulatory Visit
Admission: EM | Admit: 2024-03-24 | Discharge: 2024-03-24 | Disposition: A | Discharge status: Home / Self Care | Attending: Nurse Practitioner | Admitting: Nurse Practitioner

## 2024-03-24 DIAGNOSIS — Z8739 Personal history of other diseases of the musculoskeletal system and connective tissue: Secondary | ICD-10-CM | POA: Diagnosis not present

## 2024-03-24 DIAGNOSIS — M5431 Sciatica, right side: Secondary | ICD-10-CM | POA: Diagnosis not present

## 2024-03-24 MED ORDER — DEXAMETHASONE SODIUM PHOSPHATE 10 MG/ML IJ SOLN
10.0000 mg | INTRAMUSCULAR | Status: AC
  Administered 2024-03-24: 10 mg via INTRAMUSCULAR

## 2024-03-24 MED ORDER — KETOROLAC TROMETHAMINE 30 MG/ML IJ SOLN
30.0000 mg | Freq: Once | INTRAMUSCULAR | Status: AC
  Administered 2024-03-24: 30 mg via INTRAMUSCULAR

## 2024-03-24 MED ORDER — METHOCARBAMOL 500 MG PO TABS
500.0000 mg | ORAL_TABLET | Freq: Two times a day (BID) | ORAL | 0 refills | Status: AC
Start: 2024-03-24 — End: ?

## 2024-03-24 MED ORDER — PREDNISONE 20 MG PO TABS
40.0000 mg | ORAL_TABLET | Freq: Every day | ORAL | 0 refills | Status: AC
Start: 2024-03-24 — End: 2024-03-29

## 2024-03-24 NOTE — ED Provider Notes (Signed)
 [facility]    CSN: [id number] Arrival date & time: 03/24/24  1855      History   Chief Complaint No chief complaint on file.   HPI [name] is a 43 y.o. female.   The history is provided by the patient.   Patient presents for complaints of ongoing right-sided low back pain that has been present for the past several months.  Patient was seen in this clinic in February for the same or similar symptoms.  Patient denies any injury or trauma.  Today, she states that the pain moves from time to time, radiating down her leg.  She has since been seen by her PCP who ordered an ultrasound which was negative.  Patient states she has not followed up with her PCP since the ultrasound was completed.  Denies loss of bowel or bladder function, inability to ambulate, numbness, or tingling. Patient states pain worsens when she is sitting down and trying to stand up.  States she has been taking Tylenol  for her symptoms with minimal relief.  Past Medical History:  Diagnosis Date   Anxiety    Arthritis    Depression    Diabetes (HCC)    GERD (gastroesophageal reflux disease)    Hypercholesteremia    Sinus infection 03/27/2021    Patient Active Problem List   Diagnosis Date Noted   Left lower quadrant abdominal pain 03/03/2024   Acute bacterial rhinosinusitis 02/01/2024   Dysphagia 03/05/2023   Gastroesophageal reflux disease 03/05/2023   Change in bowel habits 03/05/2023   Nausea without vomiting 03/05/2023   Loss of weight 03/05/2023   Iron deficiency anemia 03/04/2023   Diarrhea 02/18/2023   Abdominal pain, RUQ 02/18/2023   H/O seasonal allergies 08/25/2022   Type 2 diabetes mellitus without complications (HCC) 07/22/2022   Need for Tdap vaccination 07/22/2022   Menorrhagia with regular cycle    Vitamin D  deficiency 02/18/2022   Annual physical exam 02/18/2022   Prediabetes 02/18/2022   Refused influenza vaccine 12/17/2021   Hot flashes 12/17/2021   Bloody  discharge from left nipple 03/21/2021   Right hip and buttock pain, ishial bursitis 07/20/2020   Anxiety 03/29/2019   Contact dermatitis 03/29/2019   Dysmenorrhea 06/30/2018   Hyperlipidemia 09/27/2015   Morbid obesity (HCC) 09/27/2015   Cigarette nicotine dependence, uncomplicated 09/27/2015   Cholecystitis with cholelithiasis 07/13/2014    Past Surgical History:  Procedure Laterality Date   BIOPSY  04/02/2023   Procedure: BIOPSY;  Surgeon: Vinetta Greening, DO;  [facility];  Service: Endoscopy;;   BREAST BIOPSY Left 04/03/2021   Procedure: BREAST BIOPSY; excision of duct lesion;  Surgeon: Awilda Bogus, MD;  Location: [facility];  Service: General;  Laterality: Left;   CHOLECYSTECTOMY N/A 07/13/2014   Procedure: LAPAROSCOPIC CHOLECYSTECTOMY;  Surgeon: Myrl Askew, MD;  Location: [facility];  Service: General;  Laterality: N/A;   COLONOSCOPY WITH PROPOFOL  N/A 04/02/2023   Procedure: COLONOSCOPY WITH PROPOFOL ;  Surgeon: Vinetta Greening, DO;  [facility];  Service: Endoscopy;  Laterality: N/A;  8:15 am, asa 3, pt knows to arrive at 6:00   DENTAL SURGERY  2018   plate top & bottom   DILATATION AND CURETTAGE/HYSTEROSCOPY WITH MINERVA N/A 04/16/2022   Procedure: DILATATION AND CURETTAGE/HYSTEROSCOPY WITH MINERVA;  Surgeon: Wendelyn Halter, MD;  Location[facility];  Service: Gynecology;  Laterality: N/A;   ESOPHAGOGASTRODUODENOSCOPY (EGD) WITH PROPOFOL  N/A 04/02/2023   Procedure: ESOPHAGOGASTRODUODENOSCOPY (EGD) WITH PROPOFOL ;  Surgeon: Goble Last  K, DO;  [facility];  Service: Endoscopy;  Laterality: N/A;   GIVENS CAPSULE STUDY N/A 07/08/2023   Normal gastric mucosa, slow gastric emptying likely secondary to Trulicity  with incomplete study, few scattered areas of small bowel petechia, 1 possible small bowel erosion.   POLYPECTOMY  04/02/2023   Procedure: POLYPECTOMY;  Surgeon: Vinetta Greening, DO;  [facility];  Service:  Endoscopy;;   TUBAL LIGATION      OB History     Gravida  2   Para  2   Term      Preterm      AB      Living  2      SAB      IAB      Ectopic      Multiple      Live Births               Home Medications    Prior to Admission medications   Medication Sig Start Date End Date Taking? Authorizing Provider  atorvastatin  (LIPITOR) 40 MG tablet Take 1 tablet (40 mg total) by mouth daily. 10/18/23   Tobi Fortes, MD  citalopram  (CELEXA ) 40 MG tablet Take 1 tablet by mouth once daily 02/29/24   Dixon, Phillip E, MD  fenofibrate  (TRICOR ) 145 MG tablet Take 1 tablet by mouth once daily 02/29/24   Dixon, Phillip E, MD  fluticasone  (FLONASE ) 50 MCG/ACT nasal spray Place 1 spray into both nostrils 2 (two) times daily. 01/29/24   Corbin Dess, PA-C  hydrOXYzine  (VISTARIL ) 25 MG capsule Take 1 capsule (25 mg total) by mouth every 8 (eight) hours as needed for anxiety. 01/12/24   Tobi Fortes, MD  lisinopril  (ZESTRIL ) 5 MG tablet Take 1 tablet (5 mg total) by mouth daily. 04/27/23   Tobi Fortes, MD  lubiprostone  (AMITIZA ) 24 MCG capsule TAKE 1 CAPSULE BY MOUTH TWICE DAILY WITH A MEAL 12/14/23   Evander Hills, PA-C  megestrol  (MEGACE ) 40 MG tablet Take 1 tablet by mouth once daily 02/01/24   Dixon, Phillip E, MD  metFORMIN  (GLUCOPHAGE -XR) 500 MG 24 hr tablet Take 1 tablet (500 mg total) by mouth daily with breakfast. 10/16/23   Tobi Fortes, MD  naproxen  (NAPROSYN ) 500 MG tablet Take 1 tablet (500 mg total) by mouth 2 (two) times daily as needed. 02/29/24   Zarwolo, Gloria, FNP  norethindrone  (MICRONOR ) 0.35 MG tablet Take 1 tablet by mouth once daily 03/07/24   Wendelyn Halter, MD  omeprazole  (PRILOSEC) 40 MG capsule Take 1 capsule (40 mg total) by mouth 1-2 times daily before meals for heartburn/reflux. 06/24/23   Evander Hills, PA-C  phentermine  37.5 MG capsule Take 1 capsule by mouth in the morning 02/29/24   Dixon, Phillip E, MD  Vitamin D , Ergocalciferol ,  (DRISDOL ) 1.25 MG (50000 UNIT) CAPS capsule TAKE 1 CAPSULE BY MOUTH ONCE A WEEK FOR  12  DOSES. 01/08/24   Tobi Fortes, MD    Family History Family History  Problem Relation Age of Onset   Heart disease Mother    Diabetes Mother    Heart disease Father    Hypertension Father    Thyroid disease Father    Hyperlipidemia Father    Breast cancer Paternal Grandmother    Breast cancer Paternal Aunt    Cancer - Colon Paternal Uncle    Colon cancer Neg Hx     Social History Social History   Tobacco Use  Smoking status: Former    Current packs/day: 0.00    Average packs/day: 0.5 packs/day for 15.0 years (7.5 ttl pk-yrs)    Types: Cigarettes    Start date: 11/13/2004    Quit date: 11/14/2019    Years since quitting: 4.3   Smokeless tobacco: Never  Vaping Use   Vaping status: Never Used  Substance Use Topics   Alcohol use: No   Drug use: No     Allergies   Patient has no known allergies.   Review of Systems Review of Systems Per HPI  Physical Exam Triage Vital Signs ED Triage Vitals  Encounter Vitals Group     BP 03/24/24 1911 131/76     Girls Systolic BP Percentile --      Girls Diastolic BP Percentile --      Boys Systolic BP Percentile --      Boys Diastolic BP Percentile --      Pulse Rate 03/24/24 1911 87     Resp 03/24/24 1911 20     Temp 03/24/24 1911 98.7 F (37.1 C)     Temp Source 03/24/24 1911 Oral     SpO2 03/24/24 1911 97 %     Weight --      Height --      Head Circumference --      Peak Flow --      Pain Score 03/24/24 1913 9     Pain Loc --      Pain Education --      Exclude from Growth Chart --    No data found.  Updated Vital Signs BP 131/76 (BP Location: Right Arm)   Pulse 87   Temp 98.7 F (37.1 C) (Oral)   Resp 20   SpO2 97%   Visual Acuity Right Eye Distance:   Left Eye Distance:   Bilateral Distance:    Right Eye Near:   Left Eye Near:    Bilateral Near:     Physical Exam Vitals and nursing note reviewed.   Constitutional:      Appearance: Normal appearance.  HENT:     Head: Normocephalic.   Eyes:     Extraocular Movements: Extraocular movements intact.     Pupils: Pupils are equal, round, and reactive to light.    Cardiovascular:     Rate and Rhythm: Normal rate and regular rhythm.     Pulses: Normal pulses.     Heart sounds: Normal heart sounds.  Pulmonary:     Effort: Pulmonary effort is normal.     Breath sounds: Normal breath sounds.  Abdominal:     Palpations: Abdomen is soft.     Tenderness: There is no abdominal tenderness. There is no right CVA tenderness or left CVA tenderness.   Musculoskeletal:     Cervical back: Normal range of motion.       Legs:   Skin:    General: Skin is warm and dry.   Neurological:     General: No focal deficit present.     Mental Status: She is alert and oriented to person, place, and time.   Psychiatric:        Mood and Affect: Mood normal.        Behavior: Behavior normal.      UC Treatments / Results  Labs (all labs ordered are listed, but only abnormal results are displayed) Labs Reviewed - No data to display  EKG   Radiology No results found.  Procedures Procedures (including critical care  time)  Medications Ordered in UC Medications  ketorolac  (TORADOL ) 30 MG/ML injection 30 mg (has no administration in time range)  dexamethasone  (DECADRON ) injection 10 mg (has no administration in time range)    Initial Impression / Assessment and Plan / UC Course  I have reviewed the triage vital signs and the nursing notes.  Pertinent labs & imaging results that were available during my care of the patient were reviewed by me and considered in my medical decision making (see chart for details).  Patient presents for complaints of right sided sciatic nerve pain that is been present for the past several months.  Patient was seen in this clinic for the same or similar symptoms, states symptoms did improve, but is since  returned.  On exam, she does have tenderness to the right sciatic nerve, there is no obvious bruising, redness, or swelling present.  Decadron  10 mg IM and Toradol  30 mg IM administered (creatinine 1.06 on 02/29/2024, GFR: 67).  Start prednisone  40 mg for the next 5 days along with methocarbamol 500 mg for back pain and spasm.  Supportive care recommendations were provided and discussed with the patient to include the use of ice or heat, over-the-counter analgesics, and stretching exercises.  Patient was advised that she should follow-up with her PCP to discuss options for referral since symptoms of fail to improve.  Patient was given strict ER follow-up precautions.  Patient was in agreement with this plan of care and verbalizes understanding.  All questions were answered.  Patient stable for discharge.   Final Clinical Impressions(s) / UC Diagnoses   Final diagnoses:  None   Discharge Instructions   None    ED Prescriptions   None    PDMP not reviewed this encounter.   Hardy Lia, NP 03/24/24 1945

## 2024-03-24 NOTE — ED Triage Notes (Signed)
 Pt reports pain in the lower back buttocks and hip, pain has been ongoing since February.

## 2024-03-24 NOTE — Discharge Instructions (Addendum)
 You were given injections of Decadron  10 mg and Toradol  30 mg.  Do not take any additional NSAIDs over the next 24 hours.  You may take Tylenol  as needed for breakthrough pain or discomfort. Take medication as prescribed. Try to remain as active as possible. Gentle range of motion and stretching exercises to help with back spasm and pain. May apply ice or heat as needed.  Ice is recommended for pain or swelling, heat for spasm or stiffness.  Apply for 20 minutes, remove for 1 hour, then repeat. Go to the emergency department immediately if you develop weakness in your legs or feet, inability to walk, loss of bowel or bladder function, difficulty urinating or passing a bowel movement, or other concerns. Follow-up with your primary care physician if your symptoms do not improve.

## 2024-03-27 ENCOUNTER — Other Ambulatory Visit: Payer: Self-pay | Admitting: Internal Medicine

## 2024-03-27 DIAGNOSIS — F419 Anxiety disorder, unspecified: Secondary | ICD-10-CM

## 2024-04-01 ENCOUNTER — Other Ambulatory Visit: Payer: Self-pay | Admitting: Internal Medicine

## 2024-04-01 DIAGNOSIS — E559 Vitamin D deficiency, unspecified: Secondary | ICD-10-CM

## 2024-04-02 NOTE — Progress Notes (Unsigned)
 Referring Provider: Melvenia Manus BRAVO, MD Primary Care Physician:  No primary care provider on file. Primary GI Physician: Dr. Cindie  No chief complaint on file.   HPI:   [name] is a 43 y.o. female with history of GERD, constipation, IDA, presenting today for follow-up of IDA and constipation***  Colonoscopy 04/02/23: Nonbleeding internal hemorrhoids diverticulosis in the sigmoid and transverse colon, three 4-6 mm polyps resected and retrieved.  Recommended repeat colonoscopy in 5 years.  Pathology with tubular adenoma   EGD: 04/02/23 Normal esophagus, gastritis biopsied, normal examined duodenum.  Recommended PPI twice daily. Pathology was benign, no H. pylori.  Givens 07/08/23:  - Normal gastric mucosa. ' - Slow gastric emptying, likely secondary to Trulicity .  - Few scattered areas of small bowel petechiations.  - One possible small, small bowel erosion.  - Overall, reassuring study, but unfortunately incomplete. Suspect petechiations/small erosion likely secondary to history of meloxicam  use. This will likely continue to improve with NSAID avoidance.  Recommendations:  Abdominal x ray to verify capsule has passed.  Considered repeating capsule endoscopy holding Trulicity  providing that capsule has passed vs stopping iron and monitoring for recurrent IDA. Spoke with patient who prefers to stop iron and monitor.     X-ray was completed showing capsule in area of the cecum.   Last seen in the office 09/23/2023.  She had no overt GI bleeding.  Denied NSAIDs.  Had stopped taking meloxicam .  Stopped iron in September as planned.  Bowels moving well with Amitiza  24 mcg twice daily.  GERD controlled on omeprazole  twice daily.  Plan to repeat labs and continue current medications.  Labs showed hemoglobin of 12.8 and iron panel within normal limits.   Patient called the office 01/19/2024 with concerns for constipation.  She was advised to add MiraLAX twice a day to  Amitiza .  Today:   Past Medical History:  Diagnosis Date   Anxiety    Arthritis    Depression    Diabetes (HCC)    GERD (gastroesophageal reflux disease)    Hypercholesteremia    Sinus infection 03/27/2021    Past Surgical History:  Procedure Laterality Date   BIOPSY  04/02/2023   Procedure: BIOPSY;  Surgeon: Cindie Carlin POUR, DO;  Location: [facility];  Service: Endoscopy;;   BREAST BIOPSY Left 04/03/2021   Procedure: BREAST BIOPSY; excision of duct lesion;  Surgeon: Kallie Manuelita BROCKS, MD;  Location: [facility];  Service: General;  Laterality: Left;   CHOLECYSTECTOMY N/A 07/13/2014   Procedure: LAPAROSCOPIC CHOLECYSTECTOMY;  Surgeon: Elsie GORMAN Holland, MD;  Location: [facility];  Service: General;  Laterality: N/A;   COLONOSCOPY WITH PROPOFOL  N/A 04/02/2023   Procedure: COLONOSCOPY WITH PROPOFOL ;  Surgeon: Cindie Carlin POUR, DO;  Location: [facility];  Service: Endoscopy;  Laterality: N/A;  8:15 am, asa 3, pt knows to arrive at 6:00   DENTAL SURGERY  2018   plate top & bottom   DILATATION AND CURETTAGE/HYSTEROSCOPY WITH MINERVA N/A 04/16/2022   Procedure: DILATATION AND CURETTAGE/HYSTEROSCOPY WITH MINERVA;  Surgeon: Jayne Vonn DEL, MD;  Location: [facility];  Service: Gynecology;  Laterality: N/A;   ESOPHAGOGASTRODUODENOSCOPY (EGD) WITH PROPOFOL  N/A 04/02/2023   Procedure: ESOPHAGOGASTRODUODENOSCOPY (EGD) WITH PROPOFOL ;  Surgeon: Cindie Carlin POUR, DO;  Location: [facility];  Service: Endoscopy;  Laterality: N/A;   GIVENS CAPSULE STUDY N/A 07/08/2023   Normal gastric mucosa, slow gastric emptying likely secondary to Trulicity  with incomplete study, few scattered areas of small bowel petechia, 1 possible  small bowel erosion.   POLYPECTOMY  04/02/2023   Procedure: POLYPECTOMY;  Surgeon: Cindie Carlin POUR, DO;  Location: [facility];  Service: Endoscopy;;   TUBAL LIGATION      Current Outpatient Medications  Medication Sig Dispense Refill   atorvastatin  (LIPITOR) 40 MG tablet  Take 1 tablet (40 mg total) by mouth daily. 90 tablet 3   citalopram  (CELEXA ) 40 MG tablet Take 1 tablet by mouth once daily 30 tablet 0   fenofibrate  (TRICOR ) 145 MG tablet Take 1 tablet by mouth once daily 30 tablet 0   fluticasone  (FLONASE ) 50 MCG/ACT nasal spray Place 1 spray into both nostrils 2 (two) times daily. 16 g 2   hydrOXYzine  (VISTARIL ) 25 MG capsule Take 1 capsule (25 mg total) by mouth every 8 (eight) hours as needed for anxiety. 30 capsule 2   lisinopril  (ZESTRIL ) 5 MG tablet Take 1 tablet (5 mg total) by mouth daily. 90 tablet 3   lubiprostone  (AMITIZA ) 24 MCG capsule TAKE 1 CAPSULE BY MOUTH TWICE DAILY WITH A MEAL 60 capsule 5   megestrol  (MEGACE ) 40 MG tablet Take 1 tablet by mouth once daily 30 tablet 0   metFORMIN  (GLUCOPHAGE -XR) 500 MG 24 hr tablet Take 1 tablet (500 mg total) by mouth daily with breakfast. 90 tablet 3   methocarbamol  (ROBAXIN ) 500 MG tablet Take 1 tablet (500 mg total) by mouth 2 (two) times daily. 20 tablet 0   naproxen  (NAPROSYN ) 500 MG tablet Take 1 tablet (500 mg total) by mouth 2 (two) times daily as needed. 60 tablet 0   norethindrone  (MICRONOR ) 0.35 MG tablet Take 1 tablet by mouth once daily 84 tablet 3   omeprazole  (PRILOSEC) 40 MG capsule Take 1 capsule (40 mg total) by mouth 1-2 times daily before meals for heartburn/reflux. 60 capsule 3   phentermine  37.5 MG capsule Take 1 capsule by mouth in the morning 30 capsule 0   Vitamin D , Ergocalciferol , (DRISDOL ) 1.25 MG (50000 UNIT) CAPS capsule Take 1 capsule by mouth once a week 12 capsule 0   No current facility-administered medications for this visit.    Allergies as of 04/04/2024   (No Known Allergies)    Family History  Problem Relation Age of Onset   Heart disease Mother    Diabetes Mother    Heart disease Father    Hypertension Father    Thyroid disease Father    Hyperlipidemia Father    Breast cancer Paternal Grandmother    Breast cancer Paternal Aunt    Cancer - Colon Paternal  Uncle    Colon cancer Neg Hx     Social History   Socioeconomic History   Marital status: Married    Spouse name: Not on file   Number of children: 2   Years of education: Not on file   Highest education level: Not on file  Occupational History   Not on file  Tobacco Use   Smoking status: Former    Current packs/day: 0.00    Average packs/day: 0.5 packs/day for 15.0 years (7.5 ttl pk-yrs)    Types: Cigarettes    Start date: 11/13/2004    Quit date: 11/14/2019    Years since quitting: 4.3   Smokeless tobacco: Never  Vaping Use   Vaping status: Never Used  Substance and Sexual Activity   Alcohol use: No   Drug use: No   Sexual activity: Yes    Birth control/protection: Surgical, Pill  Other Topics Concern   Not on file  Social History Narrative   Lives with her spouse and children.    Social Drivers of Corporate investment banker Strain: Low Risk  (03/14/2022)   Overall Financial Resource Strain (CARDIA)    Difficulty of Paying Living Expenses: Not hard at all  Food Insecurity: No Food Insecurity (03/14/2022)   Hunger Vital Sign    Worried About Running Out of Food in the Last Year: Never true    Ran Out of Food in the Last Year: Never true  Transportation Needs: No Transportation Needs (03/14/2022)   PRAPARE - Administrator, Civil Service (Medical): No    Lack of Transportation (Non-Medical): No  Physical Activity: Insufficiently Active (03/14/2022)   Exercise Vital Sign    Days of Exercise per Week: 5 days    Minutes of Exercise per Session: 20 min  Stress: No Stress Concern Present (03/14/2022)   [employer] of Occupational Health - Occupational Stress Questionnaire    Feeling of Stress : Only a little  Social Connections: Moderately Isolated (03/14/2022)   Social Connection and Isolation Panel    Frequency of Communication with Friends and Family: Twice a week    Frequency of Social Gatherings with Friends and Family: Never    Attends Religious Services:  1 to 4 times per year    Active Member of [employer] or Organizations: No    Attends Banker Meetings: Never    Marital Status: Married    Review of Systems: Gen: Denies fever, chills, anorexia. Denies fatigue, weakness, weight loss.  CV: Denies chest pain, palpitations, syncope, peripheral edema, and claudication. Resp: Denies dyspnea at rest, cough, wheezing, coughing up blood, and pleurisy. GI: Denies vomiting blood, jaundice, and fecal incontinence.   Denies dysphagia or odynophagia. Derm: Denies rash, itching, dry skin Psych: Denies depression, anxiety, memory loss, confusion. No homicidal or suicidal ideation.  Heme: Denies bruising, bleeding, and enlarged lymph nodes.  Physical Exam: There were no vitals taken for this visit. General:   Alert and oriented. No distress noted. Pleasant and cooperative.  Head:  Normocephalic and atraumatic. Eyes:  Conjuctiva clear without scleral icterus. Heart:  S1, S2 present without murmurs appreciated. Lungs:  Clear to auscultation bilaterally. No wheezes, rales, or rhonchi. No distress.  Abdomen:  +BS, soft, non-tender and non-distended. No rebound or guarding. No HSM or masses noted. Msk:  Symmetrical without gross deformities. Normal posture. Extremities:  Without edema. Neurologic:  Alert and  oriented x4 Psych:  Normal mood and affect.    Assessment:     Plan:  ***   Josette Centers, PA-C [facility] 04/04/2024

## 2024-04-03 ENCOUNTER — Other Ambulatory Visit: Payer: Self-pay | Admitting: Internal Medicine

## 2024-04-04 ENCOUNTER — Other Ambulatory Visit: Payer: Self-pay | Admitting: Internal Medicine

## 2024-04-04 ENCOUNTER — Ambulatory Visit: Admitting: Gastroenterology

## 2024-04-04 ENCOUNTER — Encounter: Payer: Self-pay | Admitting: Gastroenterology

## 2024-04-04 VITALS — BP 132/83 | HR 90 | Temp 98.1°F | Ht 65.0 in | Wt 242.2 lb

## 2024-04-04 DIAGNOSIS — K5909 Other constipation: Secondary | ICD-10-CM

## 2024-04-04 DIAGNOSIS — R1031 Right lower quadrant pain: Secondary | ICD-10-CM

## 2024-04-04 DIAGNOSIS — D509 Iron deficiency anemia, unspecified: Secondary | ICD-10-CM

## 2024-04-04 DIAGNOSIS — K5904 Chronic idiopathic constipation: Secondary | ICD-10-CM

## 2024-04-04 NOTE — Patient Instructions (Addendum)
 Stop Amitiza .  Start Linzess  290 mcg daily.  Take this medication first thing in the morning, 30 minutes before breakfast.  We are providing you with samples.  Call or send a MyChart message with a progress report in 1 week.  If this works well, I will send in a prescription for you to see if we can get it covered.  You may need to talk with your primary care doctor to see about holding phentermine  as this may be contributing to constipation.  I also encourage you to follow-up with your orthopedic as your right sided low abdominal pain may be related to hip pain.   Continue to avoid all NSAID products.   I will see back in 8 weeks or sooner if needed.   Josette Centers, PA-C [facility]

## 2024-04-13 ENCOUNTER — Ambulatory Visit (INDEPENDENT_AMBULATORY_CARE_PROVIDER_SITE_OTHER)

## 2024-04-13 VITALS — BP 136/83 | HR 91 | Ht 65.0 in | Wt 264.0 lb

## 2024-04-13 DIAGNOSIS — E785 Hyperlipidemia, unspecified: Secondary | ICD-10-CM | POA: Diagnosis not present

## 2024-04-13 DIAGNOSIS — Z7984 Long term (current) use of oral hypoglycemic drugs: Secondary | ICD-10-CM | POA: Diagnosis not present

## 2024-04-13 DIAGNOSIS — E119 Type 2 diabetes mellitus without complications: Secondary | ICD-10-CM | POA: Diagnosis not present

## 2024-04-13 DIAGNOSIS — E559 Vitamin D deficiency, unspecified: Secondary | ICD-10-CM | POA: Diagnosis not present

## 2024-04-13 NOTE — Progress Notes (Signed)
 Established Patient Office Visit  Subjective   Patient ID: [name], female    DOB: 12-Jul-1981  Age: 43 y.o. MRN: [id number]  Chief Complaint  Patient presents with   Medical Management of Chronic Issues    3 month follow up     HPI  Patient Active Problem List   Diagnosis Date Noted   Left lower quadrant abdominal pain 03/03/2024   Acute bacterial rhinosinusitis 02/01/2024   Dysphagia 03/05/2023   Gastroesophageal reflux disease 03/05/2023   Change in bowel habits 03/05/2023   Nausea without vomiting 03/05/2023   Loss of weight 03/05/2023   Iron deficiency anemia 03/04/2023   Diarrhea 02/18/2023   Abdominal pain, RUQ 02/18/2023   H/O seasonal allergies 08/25/2022   Type 2 diabetes mellitus without complications (HCC) 07/22/2022   Need for Tdap vaccination 07/22/2022   Menorrhagia with regular cycle    Vitamin D  deficiency 02/18/2022   Annual physical exam 02/18/2022   Prediabetes 02/18/2022   Refused influenza vaccine 12/17/2021   Hot flashes 12/17/2021   Bloody discharge from left nipple 03/21/2021   Right hip and buttock pain, ishial bursitis 07/20/2020   Anxiety 03/29/2019   Contact dermatitis 03/29/2019   Dysmenorrhea 06/30/2018   Hyperlipidemia 09/27/2015   Morbid obesity (HCC) 09/27/2015   Cigarette nicotine dependence, uncomplicated 09/27/2015   Cholecystitis with cholelithiasis 07/13/2014      ROS    Objective:     BP 136/83   Pulse 91   Ht 5' 5 (1.651 m)   Wt 264 lb (119.7 kg)   LMP  (LMP Unknown)   SpO2 97%   BMI 43.93 kg/m  BP Readings from Last 3 Encounters:  04/13/24 136/83  04/04/24 132/83  03/24/24 131/76   Wt Readings from Last 3 Encounters:  04/13/24 264 lb (119.7 kg)  04/04/24 242 lb 3.2 oz (109.9 kg)  02/29/24 246 lb (111.6 kg)     Physical Exam Vitals and nursing note reviewed.  Constitutional:      Appearance: She is obese.  HENT:     Head: Normocephalic.     Right Ear: Tympanic membrane, ear canal and  external ear normal.     Left Ear: Tympanic membrane, ear canal and external ear normal.     Nose: Nose normal.     Mouth/Throat:     Mouth: Mucous membranes are moist.     Pharynx: Oropharynx is clear.  Cardiovascular:     Rate and Rhythm: Normal rate and regular rhythm.  Pulmonary:     Effort: Pulmonary effort is normal.     Breath sounds: Normal breath sounds.  Musculoskeletal:     Cervical back: Normal range of motion and neck supple.  Skin:    General: Skin is warm and dry.  Neurological:     Mental Status: She is alert and oriented to person, place, and time.  Psychiatric:        Mood and Affect: Mood normal.        Thought Content: Thought content normal.     The 10-year ASCVD risk score (Arnett DK, et al., 2019) is: 1.8%    Assessment & Plan:   Problem List Items Addressed This Visit       Endocrine   Type 2 diabetes mellitus without complications (HCC) - Primary   A1c 6.0 on labs from January.  She is currently prescribed metformin  XR 500 mg daily.  Trulicity  is not covered by insurance. She has lost 24 pounds since her last appointment, but  is discouraged by lack of continued weight loss.  We discussed dietary changes and need to increase physical activity and possible adding weights to exercise regimen.  Advised to cut out/down on sodas and energy drinks, even diet formulations and to increase water  intake.  We also discussed increasing protein and calories in the am, as apposed to just eating dinner as her only meal.    -No medication changes today. Repeat A1c at follow up in 3 months. Can further increase metformin  if needed.        Relevant Orders   CMP14+EGFR (Completed)   HgB A1c (Completed)     Other   Hyperlipidemia   Lipid panel updated in January. Total cholesterol 161 and LDL 97. Simvastatin  was discontinued in favor of atorvastatin  40 mg daily. She has no experienced any adverse side effects with this change. -Repeat lipid panel at follow up in 3  months.      Relevant Orders   CMP14+EGFR (Completed)   Lipid Profile (Completed)   Vitamin D  deficiency   Noted on labs from January. She has completed high-dose, weekly vitamin D  supplementation. Repeat vitamin D  level ordered today.       Relevant Orders   Vitamin D  (25 hydroxy) (Completed)    No follow-ups on file.    Leita Longs, FNP

## 2024-04-14 LAB — LIPID PANEL
Chol/HDL Ratio: 4 ratio (ref 0.0–4.4)
Cholesterol, Total: 153 mg/dL (ref 100–199)
HDL: 38 mg/dL — ABNORMAL LOW (ref >39)
LDL Chol Calc (NIH): 97 mg/dL (ref 0–99)
Triglycerides: 93 mg/dL (ref 0–149)
VLDL Cholesterol Cal: 18 mg/dL (ref 5–40)

## 2024-04-14 LAB — CMP14+EGFR
ALT: 32 IU/L (ref 0–32)
AST: 22 IU/L (ref 0–40)
Albumin: 4.7 g/dL (ref 3.9–4.9)
Alkaline Phosphatase: 44 IU/L (ref 44–121)
BUN/Creatinine Ratio: 15 (ref 9–23)
BUN: 15 mg/dL (ref 6–24)
Bilirubin Total: 0.2 mg/dL (ref 0.0–1.2)
CO2: 16 mmol/L — ABNORMAL LOW (ref 20–29)
Calcium: 10.1 mg/dL (ref 8.7–10.2)
Chloride: 109 mmol/L — ABNORMAL HIGH (ref 96–106)
Creatinine, Ser: 1 mg/dL (ref 0.57–1.00)
Globulin, Total: 2.5 g/dL (ref 1.5–4.5)
Glucose: 81 mg/dL (ref 70–99)
Potassium: 4.2 mmol/L (ref 3.5–5.2)
Sodium: 141 mmol/L (ref 134–144)
Total Protein: 7.2 g/dL (ref 6.0–8.5)
eGFR: 72 mL/min/{1.73_m2} (ref >59)

## 2024-04-14 LAB — HEMOGLOBIN A1C
Est. average glucose Bld gHb Est-mCnc: 120 mg/dL
Hgb A1c MFr Bld: 5.8 % — ABNORMAL HIGH (ref 4.8–5.6)

## 2024-04-14 LAB — VITAMIN D 25 HYDROXY (VIT D DEFICIENCY, FRACTURES): Vit D, 25-Hydroxy: 46.7 ng/mL (ref 30.0–100.0)

## 2024-04-17 ENCOUNTER — Ambulatory Visit: Payer: Self-pay

## 2024-04-17 NOTE — Assessment & Plan Note (Signed)
 Noted on labs from January.  She has completed high-dose, weekly vitamin D supplementation. -Repeat vitamin D level ordered today

## 2024-04-17 NOTE — Assessment & Plan Note (Signed)
 Lipid panel updated in January. Total cholesterol 161 and LDL 97. Simvastatin was discontinued in favor of atorvastatin 40 mg daily. She has no experienced any adverse side effects with this change. -Repeat lipid panel at follow up in 3 months.

## 2024-04-17 NOTE — Assessment & Plan Note (Addendum)
 A1c 6.0 on labs from January.  She is currently prescribed metformin  XR 500 mg daily.  Trulicity  is not covered by insurance. She has lost 24 pounds since her last appointment, but is discouraged by lack of continued weight loss.  We discussed dietary changes and need to increase physical activity and possible adding weights to exercise regimen.  Advised to cut out/down on sodas and energy drinks, even diet formulations and to increase water  intake.  We also discussed increasing protein and calories in the am, as apposed to just eating dinner as her only meal.    -No medication changes today. Repeat A1c at follow up in 3 months. Can further increase metformin  if needed.

## 2024-04-18 ENCOUNTER — Other Ambulatory Visit: Payer: Self-pay

## 2024-04-18 ENCOUNTER — Other Ambulatory Visit: Payer: Self-pay | Admitting: Internal Medicine

## 2024-04-19 ENCOUNTER — Other Ambulatory Visit: Payer: Self-pay | Admitting: Gastroenterology

## 2024-04-19 DIAGNOSIS — K219 Gastro-esophageal reflux disease without esophagitis: Secondary | ICD-10-CM

## 2024-04-27 ENCOUNTER — Other Ambulatory Visit (INDEPENDENT_AMBULATORY_CARE_PROVIDER_SITE_OTHER): Payer: Self-pay

## 2024-04-27 ENCOUNTER — Encounter: Payer: Self-pay | Admitting: Orthopaedic Surgery

## 2024-04-27 ENCOUNTER — Ambulatory Visit: Admitting: Orthopaedic Surgery

## 2024-04-27 VITALS — BP 119/76 | HR 80 | Ht 65.0 in

## 2024-04-27 DIAGNOSIS — M5441 Lumbago with sciatica, right side: Secondary | ICD-10-CM | POA: Diagnosis not present

## 2024-04-27 DIAGNOSIS — G8929 Other chronic pain: Secondary | ICD-10-CM | POA: Diagnosis not present

## 2024-04-27 DIAGNOSIS — M25551 Pain in right hip: Secondary | ICD-10-CM

## 2024-04-27 MED ORDER — HYDROCODONE-ACETAMINOPHEN 5-325 MG PO TABS: 1.0000 | ORAL_TABLET | ORAL | 0 refills | Status: AC | PRN

## 2024-04-27 MED ORDER — PREDNISONE 5 MG (21) PO TBPK: ORAL_TABLET | ORAL | 0 refills | Status: DC

## 2024-04-27 NOTE — Progress Notes (Signed)
 Subjective:    Patient ID: [name], female    DOB: [birth year], 43 y.o.   MRN: [id number]  HPI She has had lower back pain radiating to the right side and leg and thigh since January.  It has gotten worse over the last few weeks.  She has no trauma.  She has tried Advil , Aleve , Tylenol , heat, ice with no help.  She has problems sleeping on the right side.  She is tried of hurting all the time.  She cannot walk distances now.  She has no redness, no swelling but has numbness to the right leg.   Review of Systems  Constitutional:  Positive for activity change.  Musculoskeletal:  Positive for arthralgias, back pain and gait problem.  All other systems reviewed and are negative. For Review of Systems, all other systems reviewed and are negative.  The following is a summary of the past history medically, past history surgically, known current medicines, social history and family history.  This information is gathered electronically by the computer from prior information and documentation.  I review this each visit and have found including this information at this point in the chart is beneficial and informative.   Past Medical History:  Diagnosis Date   Anxiety    Arthritis    Depression    Diabetes (HCC)    GERD (gastroesophageal reflux disease)    Hypercholesteremia    Sinus infection 03/27/2021    Past Surgical History:  Procedure Laterality Date   BIOPSY  04/02/2023   Procedure: BIOPSY;  Surgeon: Cindie Carlin POUR, DO;  [facility];  Service: Endoscopy;;   BREAST BIOPSY Left 04/03/2021   Procedure: BREAST BIOPSY; excision of duct lesion;  Surgeon: Kallie Manuelita BROCKS, MD;  Location: [facility];  Service: General;  Laterality: Left;   CHOLECYSTECTOMY N/A 07/13/2014   Procedure: LAPAROSCOPIC CHOLECYSTECTOMY;  Surgeon: Elsie GORMAN Holland, MD;  Location: [facility];  Service: General;  Laterality: N/A;   COLONOSCOPY WITH PROPOFOL  N/A 04/02/2023   Procedure: COLONOSCOPY WITH  PROPOFOL ;  Surgeon: Cindie Carlin POUR, DO;  [facility];  Service: Endoscopy;  Laterality: N/A;  8:15 am, asa 3, pt knows to arrive at 6:00   DENTAL SURGERY  2018   plate top & bottom   DILATATION AND CURETTAGE/HYSTEROSCOPY WITH MINERVA N/A 04/16/2022   Procedure: DILATATION AND CURETTAGE/HYSTEROSCOPY WITH MINERVA;  Surgeon: Jayne Vonn DEL, MD;  Location: [facility];  Service: Gynecology;  Laterality: N/A;   ESOPHAGOGASTRODUODENOSCOPY (EGD) WITH PROPOFOL  N/A 04/02/2023   Procedure: ESOPHAGOGASTRODUODENOSCOPY (EGD) WITH PROPOFOL ;  Surgeon: Cindie Carlin POUR, DO;  [facility];  Service: Endoscopy;  Laterality: N/A;   GIVENS CAPSULE STUDY N/A 07/08/2023   Normal gastric mucosa, slow gastric emptying likely secondary to Trulicity  with incomplete study, few scattered areas of small bowel petechia, 1 possible small bowel erosion.   POLYPECTOMY  04/02/2023   Procedure: POLYPECTOMY;  Surgeon: Cindie Carlin POUR, DO;  [facility];  Service: Endoscopy;;   TUBAL LIGATION      Current Outpatient Medications on File Prior to Visit  Medication Sig Dispense Refill   atorvastatin  (LIPITOR) 40 MG tablet Take 1 tablet (40 mg total) by mouth daily. 90 tablet 3   citalopram  (CELEXA ) 40 MG tablet Take 1 tablet by mouth once daily 30 tablet 0   fenofibrate  (TRICOR ) 145 MG tablet Take 1 tablet by mouth once daily 30 tablet 0   fluticasone  (FLONASE ) 50 MCG/ACT nasal spray Place 1 spray into  both nostrils 2 (two) times daily. 16 g 2   hydrOXYzine  (VISTARIL ) 25 MG capsule Take 1 capsule (25 mg total) by mouth every 8 (eight) hours as needed for anxiety. 30 capsule 2   lisinopril  (ZESTRIL ) 5 MG tablet Take 1 tablet (5 mg total) by mouth daily. 90 tablet 3   lubiprostone  (AMITIZA ) 24 MCG capsule TAKE 1 CAPSULE BY MOUTH TWICE DAILY WITH A MEAL 60 capsule 5   megestrol  (MEGACE ) 40 MG tablet Take 1 tablet by mouth once daily 30 tablet 0   metFORMIN  (GLUCOPHAGE -XR) 500 MG 24 hr tablet Take 1  tablet (500 mg total) by mouth daily with breakfast. 90 tablet 3   methocarbamol  (ROBAXIN ) 500 MG tablet Take 1 tablet (500 mg total) by mouth 2 (two) times daily. 20 tablet 0   norethindrone  (MICRONOR ) 0.35 MG tablet Take 1 tablet by mouth once daily 84 tablet 3   omeprazole  (PRILOSEC) 40 MG capsule TAKE 1 CAPSULE BY MOUTH 1-2 TIMES DAILY BEFORE MEALS FOR HEARTBURN/REFLUX. 60 capsule 3   phentermine  37.5 MG capsule Take 1 capsule by mouth in the morning 30 capsule 0   Vitamin D , Ergocalciferol , (DRISDOL ) 1.25 MG (50000 UNIT) CAPS capsule Take 1 capsule by mouth once a week 12 capsule 0   No current facility-administered medications on file prior to visit.    Social History   Socioeconomic History   Marital status: Married    Spouse name: Not on file   Number of children: 2   Years of education: Not on file   Highest education level: Not on file  Occupational History   Not on file  Tobacco Use   Smoking status: Former    Current packs/day: 0.00    Average packs/day: 0.5 packs/day for 15.0 years (7.5 ttl pk-yrs)    Types: Cigarettes    Start date: 11/13/2004    Quit date: 11/14/2019    Years since quitting: 4.4   Smokeless tobacco: Never  Vaping Use   Vaping status: Never Used  Substance and Sexual Activity   Alcohol use: No   Drug use: No   Sexual activity: Yes    Birth control/protection: Surgical, Pill  Other Topics Concern   Not on file  Social History Narrative   Lives with her spouse and children.    Social Drivers of Corporate investment banker Strain: Low Risk  (03/14/2022)   Overall Financial Resource Strain (CARDIA)    Difficulty of Paying Living Expenses: Not hard at all  Food Insecurity: No Food Insecurity (03/14/2022)   Hunger Vital Sign    Worried About Running Out of Food in the Last Year: Never true    Ran Out of Food in the Last Year: Never true  Transportation Needs: No Transportation Needs (03/14/2022)   PRAPARE - Administrator, Civil Service  (Medical): No    Lack of Transportation (Non-Medical): No  Physical Activity: Insufficiently Active (03/14/2022)   Exercise Vital Sign    Days of Exercise per Week: 5 days    Minutes of Exercise per Session: 20 min  Stress: No Stress Concern Present (03/14/2022)   [employer] of Occupational Health - Occupational Stress Questionnaire    Feeling of Stress : Only a little  Social Connections: Moderately Isolated (03/14/2022)   Social Connection and Isolation Panel    Frequency of Communication with Friends and Family: Twice a week    Frequency of Social Gatherings with Friends and Family: Never    Attends Religious Services: 1  to 4 times per year    Active Member of Clubs or Organizations: No    Attends Banker Meetings: Never    Marital Status: Married  Catering manager Violence: Not At Risk (03/14/2022)   Humiliation, Afraid, Rape, and Kick questionnaire    Fear of Current or Ex-Partner: No    Emotionally Abused: No    Physically Abused: No    Sexually Abused: No    Family History  Problem Relation Age of Onset   Heart disease Mother    Diabetes Mother    Heart disease Father    Hypertension Father    Thyroid disease Father    Hyperlipidemia Father    Breast cancer Paternal Grandmother    Breast cancer Paternal Aunt    Cancer - Colon Paternal Uncle    Colon cancer Neg Hx     BP 119/76   Pulse 80   Ht 5' 5 (1.651 m)   LMP  (LMP Unknown)   BMI 43.93 kg/m   Body mass index is 43.93 kg/m.      Objective:   Physical Exam Vitals and nursing note reviewed. Exam conducted with a chaperone present.  Constitutional:      Appearance: She is well-developed.  HENT:     Head: Normocephalic and atraumatic.  Eyes:     Conjunctiva/sclera: Conjunctivae normal.     Pupils: Pupils are equal, round, and reactive to light.  Cardiovascular:     Rate and Rhythm: Normal rate and regular rhythm.  Pulmonary:     Effort: Pulmonary effort is normal.  Abdominal:      Palpations: Abdomen is soft.  Musculoskeletal:       Arms:     Cervical back: Normal range of motion and neck supple.  Skin:    General: Skin is warm and dry.  Neurological:     Mental Status: She is alert and oriented to person, place, and time.     Cranial Nerves: No cranial nerve deficit.     Motor: No abnormal muscle tone.     Coordination: Coordination normal.     Deep Tendon Reflexes: Reflexes are normal and symmetric. Reflexes normal.  Psychiatric:        Behavior: Behavior normal.        Thought Content: Thought content normal.        Judgment: Judgment normal.   X-rays were done of the right hip and the lumbar spine, reported separately.  There is a compression fracture of L1, age indeterminate.  I would like to get a MRI.        Assessment & Plan:   Encounter Diagnoses  Name Primary?   Chronic right-sided low back pain with right-sided sciatica Yes   Pain in right hip    I am concerned about a compression fracture of L1.  Her insurance requires PT first before MRI.  I will get PT and then see her in two weeks and order MRI then.  I have reviewed the [place]  Controlled Substance Reporting System web site prior to prescribing narcotic medicine for this patient.  I will also give prednisone  dose pack.  I have shown her her X-rays.  She took a picture with her phone.  Return in two weeks.  Call if any problem.  Precautions discussed.  Electronically Signed Lemond Stable, MD 7/16/202510:52 AM

## 2024-04-29 ENCOUNTER — Other Ambulatory Visit: Payer: Self-pay

## 2024-04-29 DIAGNOSIS — F419 Anxiety disorder, unspecified: Secondary | ICD-10-CM

## 2024-05-03 ENCOUNTER — Ambulatory Visit: Admitting: Orthopedic Surgery

## 2024-05-09 ENCOUNTER — Other Ambulatory Visit: Payer: Self-pay

## 2024-05-11 ENCOUNTER — Ambulatory Visit: Admitting: Orthopaedic Surgery

## 2024-05-11 ENCOUNTER — Encounter: Payer: Self-pay | Admitting: Orthopaedic Surgery

## 2024-05-11 DIAGNOSIS — G8929 Other chronic pain: Secondary | ICD-10-CM | POA: Diagnosis not present

## 2024-05-11 DIAGNOSIS — M5441 Lumbago with sciatica, right side: Secondary | ICD-10-CM

## 2024-05-11 MED ORDER — HYDROCODONE-ACETAMINOPHEN 7.5-325 MG PO TABS: ORAL_TABLET | ORAL | 0 refills | Status: DC

## 2024-05-11 NOTE — Addendum Note (Signed)
 Addended byBETHA JENEAN GREIG LELON on: 05/11/2024 01:07 PM   Modules accepted: Orders

## 2024-05-11 NOTE — Progress Notes (Signed)
 My back is worse.  She did not get PT as they are full here in [place] and appointment is August 28.  That is too long to wait.  I will try to get her into [facility].  They just opened up new office there.  She needs PT before MRI can be done.  Lower back is tender diffusely, ROM decreased, muscle tone and strength normal, NV intact.  Encounter Diagnosis  Name Primary?   Chronic right-sided low back pain with right-sided sciatica Yes   I have reviewed the [place]  Controlled Substance Reporting System web site prior to prescribing narcotic medicine for this patient.  Go to PT.  Return in two weeks.  Consider MRI if not improved.  Call if any problem.  Precautions discussed.  Electronically Signed Lemond Stable, MD 7/30/202510:29 AM

## 2024-05-11 NOTE — Addendum Note (Signed)
 Addended by: DARRA MILLING R on: 05/11/2024 02:38 PM   Modules accepted: Orders

## 2024-05-12 ENCOUNTER — Other Ambulatory Visit: Payer: Self-pay

## 2024-05-12 MED ORDER — LINACLOTIDE 290 MCG PO CAPS: 290.0000 ug | ORAL_CAPSULE | Freq: Every day | ORAL | 3 refills | Status: DC

## 2024-05-17 NOTE — Therapy (Signed)
 OUTPATIENT PHYSICAL THERAPY EVALUATION   Patient Name: [name] MRN: [id number] DOB:[birth year], 43 y.o., female Today's Date: 05/18/2024  END OF SESSION:  PT End of Session - 05/18/24 0917     Visit Number 1    Number of Visits 3    Date for PT Re-Evaluation 06/08/24    PT Start Time 0835    PT Stop Time 0917    PT Time Calculation (min) 42 min    Activity Tolerance Patient tolerated treatment well    Behavior During Therapy [facility] for tasks assessed/performed          Past Medical History:  Diagnosis Date   Anxiety    Arthritis    Depression    Diabetes (HCC)    GERD (gastroesophageal reflux disease)    Hypercholesteremia    Sinus infection 03/27/2021   Past Surgical History:  Procedure Laterality Date   BIOPSY  04/02/2023   Procedure: BIOPSY;  Surgeon: Cindie Carlin POUR, DO;  [facility];  Service: Endoscopy;;   BREAST BIOPSY Left 04/03/2021   Procedure: BREAST BIOPSY; excision of duct lesion;  Surgeon: Kallie Manuelita BROCKS, MD;  Location: [facility];  Service: General;  Laterality: Left;   CHOLECYSTECTOMY N/A 07/13/2014   Procedure: LAPAROSCOPIC CHOLECYSTECTOMY;  Surgeon: Elsie GORMAN Holland, MD;  Location: [facility];  Service: General;  Laterality: N/A;   COLONOSCOPY WITH PROPOFOL  N/A 04/02/2023   Procedure: COLONOSCOPY WITH PROPOFOL ;  Surgeon: Cindie Carlin POUR, DO;  [facility];  Service: Endoscopy;  Laterality: N/A;  8:15 am, asa 3, pt knows to arrive at 6:00   DENTAL SURGERY  2018   plate top & bottom   DILATATION AND CURETTAGE/HYSTEROSCOPY WITH MINERVA N/A 04/16/2022   Procedure: DILATATION AND CURETTAGE/HYSTEROSCOPY WITH MINERVA;  Surgeon: Jayne Vonn DEL, MD;  Location[facility];  Service: Gynecology;  Laterality: N/A;   ESOPHAGOGASTRODUODENOSCOPY (EGD) WITH PROPOFOL  N/A 04/02/2023   Procedure: ESOPHAGOGASTRODUODENOSCOPY (EGD) WITH PROPOFOL ;  Surgeon: Cindie Carlin POUR, DO;  [facility];  Service: Endoscopy;  Laterality: N/A;    GIVENS CAPSULE STUDY N/A 07/08/2023   Normal gastric mucosa, slow gastric emptying likely secondary to Trulicity  with incomplete study, few scattered areas of small bowel petechia, 1 possible small bowel erosion.   POLYPECTOMY  04/02/2023   Procedure: POLYPECTOMY;  Surgeon: Cindie Carlin POUR, DO;  [facility];  Service: Endoscopy;;   TUBAL LIGATION     Patient Active Problem List   Diagnosis Date Noted   Left lower quadrant abdominal pain 03/03/2024   Acute bacterial rhinosinusitis 02/01/2024   Dysphagia 03/05/2023   Gastroesophageal reflux disease 03/05/2023   Change in bowel habits 03/05/2023   Nausea without vomiting 03/05/2023   Loss of weight 03/05/2023   Iron deficiency anemia 03/04/2023   Diarrhea 02/18/2023   Abdominal pain, RUQ 02/18/2023   H/O seasonal allergies 08/25/2022   Type 2 diabetes mellitus without complications (HCC) 07/22/2022   Need for Tdap vaccination 07/22/2022   Menorrhagia with regular cycle    Vitamin D  deficiency 02/18/2022   Annual physical exam 02/18/2022   Prediabetes 02/18/2022   Refused influenza vaccine 12/17/2021   Hot flashes 12/17/2021   Bloody discharge from left nipple 03/21/2021   Right hip and buttock pain, ishial bursitis 07/20/2020   Anxiety 03/29/2019   Contact dermatitis 03/29/2019   Dysmenorrhea 06/30/2018   Hyperlipidemia 09/27/2015   Morbid obesity (HCC) 09/27/2015   Cigarette nicotine dependence, uncomplicated 09/27/2015   Cholecystitis with cholelithiasis 07/13/2014    PCP:  Bevely Doffing, FNP   REFERRING PROVIDER: Brenna Lin, MD   REFERRING DIAG:  [phone] (ICD-10-CM) - Chronic right-sided low back pain with right-sided sciatica  M25.551 (ICD-10-CM) - Pain in right hip    Rationale for Evaluation and Treatment:  Rehabiliation  THERAPY DIAG:  Other low back pain  Pain in right leg  Muscle weakness (generalized)  ONSET DATE: Pain since January 2025   SUBJECTIVE:                                                                                                                                                                                            SUBJECTIVE STATEMENT: She has had lower back pain radiating to the right side and leg and thigh since January. It has gotten worse over the last few weeks. She has no trauma, it just came on gradually and now it is really bad.   PERTINENT HISTORY:  See above PMH  PAIN:  NPRS scale:7.5 /10 upon arrival Pain location:low back and right leg Pain description: constant, throbbing Aggravating factors: sweeping, vacuum, sitting Relieving factors: standing. Had no relief with ice, heat, muscle rub   PRECAUTIONS: ,  None  RED FLAGS: None   WEIGHT BEARING RESTRICTIONS:  No  FALLS:  Has patient fallen in last 6 months? No   OCCUPATION:  none  PLOF:  Independent  PATIENT GOALS:  Reduce pain  OBJECTIVE:  Note: Objective measures were completed at Evaluation unless otherwise noted.  DIAGNOSTIC FINDINGS:  XR show There is a compression fracture of L1, age indeterminate. MD would like to get a MRI but insurance requires PT first.  PATIENT SURVEYS:  Patient-Specific Activity Scoring Scheme  0 represents "unable to perform." 10 represents "able to perform at prior level. 0 1 2 3 4 5 6 7 8 9  10 (Date and Score)   Activity Eval     1. Sweeping/house cleaning 5    2.       3.     4.    5.    Score 5/10    Total score = sum of the activity scores/number of activities Minimum detectable change (90%CI) for average score = 2 points Minimum detectable change (90%CI) for single activity score = 3 points  GAIT: Assistive device utilized: None Level of assistance: Complete Independence Comments: antalgic gait on right with possible right leg shorter as she dips down on that side.    LUMBAR ROM:   Active  AROM  eval  Flexion 75%  Extension 50%  Right lateral flexion 25%  Left lateral flexion 25%  Right  rotation 50%  Left rotation 50%   (  Blank rows = not tested)   LOWER EXTREMITY MMT:    MMT Right eval Left eval  Hip flexion 4+ 5  Hip extension    Hip abduction 4+ 5  Hip adduction    Hip internal rotation    Hip external rotation    Knee flexion 4+ 5  Knee extension 4+ 5  Ankle dorsiflexion    Ankle plantarflexion    Ankle inversion    Ankle eversion     (Blank rows = not tested)    SPECIAL TESTS:  Lumbar Straight leg raise test: Positive and Slump test: Positive for both of these tests on                                                                                                                              TREATMENT DATE:  Eval HEP creation and review with demonstration and education see below for details Selfcare: recommended shoe insert for right shoe due to possible 1/4 inch leg length desrepency. Recommended home TENS unit and provided information and instructions for one.    PATIENT EDUCATION: Education details: HEP, PT plan of care, selfcare Person educated: Patient Education method: Explanation, Demonstration, Verbal cues, and Handouts Education comprehension: verbalized understanding, further education recommended   HOME EXERCISE PROGRAM: Access Code: [email or web address] URL: [email or web address] Date: 05/18/2024 Prepared by: Redell Moose  Exercises - Slump Stretch (Mirrored)  - 2 x daily - 6 x weekly - 1 sets - 10 reps - 3 hold - Seated Piriformis Stretch  - 2 x daily - 6 x weekly - 1 sets - 3 reps - 30 hold - Standing Lumbar Extension at Wall - Forearms  - 2 x daily - 6 x weekly - 1 sets - 10 reps - Standing Hip Extension  - 2 x daily - 6 x weekly - 1 sets - 10 reps - 5 sec hold - Sidelying Thoracic Rotation with Open Book  - 2 x daily - 6 x weekly - 1 sets - 10 reps - 5 hold  ASSESSMENT:  CLINICAL IMPRESSION: Patient referred to PT for chronic right sided low back pain with right leg radiculopathy. She may have a 1/4 inch leg length  descrepency on right leg so did recommend shoe insert for her to even this up. I did try TENS therapy today to try to help with pain control.  Patient will benefit from skilled PT to improve overall function and to address impairments and limitations listed below.  OBJECTIVE IMPAIRMENTS: decreased activity tolerance for ADL's, difficulty walking,  decreased endurance, decreased mobility, decreased ROM, decreased strength, impaired flexibility, impaired LE use, and pain.  ACTIVITY LIMITATIONS: bending, liftting, walking, standing, cleaning, community activity,  PERSONAL FACTORS: see above PMH  also affecting patient's functional outcome.  REHAB POTENTIAL: Good  CLINICAL DECISION MAKING: Stable/uncomplicated  EVALUATION COMPLEXITY: Low    GOALS: Short term PT Goals Target date: 06/15/2024    Long term PT goals Target date:06/15/2024   Pt  will improve lumbar ROM to [facility] (at least 75%) to improve functional mobility Baseline: Goal status: New Pt will improve Rt leg strength to at least 5/5 MMT to improve functional strength Baseline:4+ Goal status: New Pt will improve Patient specific functional scale (PSFS) to at least 7/10 to show improved function level Baseline:5 Goal status: New Pt will reduce pain to overall less than 3/10 with usual activity and work activity. Baseline:7.5 Goal status: New Pt will be I and compliant with HEP.  Goal status: New   PLAN: PT FREQUENCY: 1-2 times per week   PT DURATION: 4 weeks  PLANNED INTERVENTIONS  97110-Therapeutic exercises, 97530- Therapeutic activity, [id number]- Neuromuscular re-education, 97535- Self Care, [id number]- Manual therapy, [id number]- Gait training, [phone]- Electrical stimulation (unattended), [id number]- Ultrasound, [id number]- Traction (mechanical), and 97033- Ionotophoresis 4mg /ml Dexamethasone   PLAN FOR NEXT SESSION: review HEP, how was TENS   Redell JONELLE Moose, PT,DPT 05/18/2024, 9:18 AM

## 2024-05-18 ENCOUNTER — Ambulatory Visit: Attending: Orthopaedic Surgery | Admitting: Physical Therapy

## 2024-05-18 ENCOUNTER — Encounter: Payer: Self-pay | Admitting: Physical Therapy

## 2024-05-18 ENCOUNTER — Other Ambulatory Visit: Payer: Self-pay | Admitting: Internal Medicine

## 2024-05-18 DIAGNOSIS — M5459 Other low back pain: Secondary | ICD-10-CM | POA: Diagnosis not present

## 2024-05-18 DIAGNOSIS — M79604 Pain in right leg: Secondary | ICD-10-CM | POA: Diagnosis not present

## 2024-05-18 DIAGNOSIS — M5441 Lumbago with sciatica, right side: Secondary | ICD-10-CM | POA: Diagnosis not present

## 2024-05-18 DIAGNOSIS — M25551 Pain in right hip: Secondary | ICD-10-CM | POA: Insufficient documentation

## 2024-05-18 DIAGNOSIS — G8929 Other chronic pain: Secondary | ICD-10-CM | POA: Diagnosis not present

## 2024-05-18 DIAGNOSIS — E119 Type 2 diabetes mellitus without complications: Secondary | ICD-10-CM

## 2024-05-18 DIAGNOSIS — M6281 Muscle weakness (generalized): Secondary | ICD-10-CM | POA: Insufficient documentation

## 2024-05-25 ENCOUNTER — Ambulatory Visit: Admitting: Orthopaedic Surgery

## 2024-05-25 ENCOUNTER — Ambulatory Visit: Admitting: Physical Therapy

## 2024-05-25 ENCOUNTER — Encounter: Payer: Self-pay | Admitting: Orthopaedic Surgery

## 2024-05-25 DIAGNOSIS — M545 Low back pain, unspecified: Secondary | ICD-10-CM | POA: Diagnosis not present

## 2024-05-25 MED ORDER — HYDROCODONE-ACETAMINOPHEN 7.5-325 MG PO TABS: ORAL_TABLET | ORAL | 0 refills | Status: DC

## 2024-05-25 NOTE — Progress Notes (Signed)
 I am no better.  She went to PT for her back in [place]. She still has right sided pain and weakness.  She is not sleeping well.  She has no new trauma, no bowel or bladder problems.  I have reviewed the PT notes.  Lower back is tender, decreased ROM, muscle tone and strength normal, gait is limp to the right, NV intact.  I will get MRI of the back as she is not any better and there is a compression fracture of L1 age indeterminate.  She has been to PT with no relief.  I have reviewed the [place]  Controlled Substance Reporting System web site prior to prescribing narcotic medicine for this patient.  Encounter Diagnosis  Name Primary?   Lumbar pain Yes   Return in two weeks.  Call if any problem.  Precautions discussed.  Electronically Signed Lemond Stable, MD 8/13/202511:04 AM

## 2024-05-27 ENCOUNTER — Ambulatory Visit: Admitting: Physical Therapy

## 2024-05-27 ENCOUNTER — Other Ambulatory Visit: Payer: Self-pay

## 2024-05-27 ENCOUNTER — Encounter: Payer: Self-pay | Admitting: Physical Therapy

## 2024-05-27 DIAGNOSIS — M79604 Pain in right leg: Secondary | ICD-10-CM

## 2024-05-27 DIAGNOSIS — M5441 Lumbago with sciatica, right side: Secondary | ICD-10-CM | POA: Diagnosis not present

## 2024-05-27 DIAGNOSIS — M6281 Muscle weakness (generalized): Secondary | ICD-10-CM

## 2024-05-27 DIAGNOSIS — G8929 Other chronic pain: Secondary | ICD-10-CM | POA: Diagnosis not present

## 2024-05-27 DIAGNOSIS — M25551 Pain in right hip: Secondary | ICD-10-CM | POA: Diagnosis not present

## 2024-05-27 DIAGNOSIS — M5459 Other low back pain: Secondary | ICD-10-CM | POA: Diagnosis not present

## 2024-05-27 NOTE — Therapy (Signed)
 OUTPATIENT PHYSICAL THERAPY TREATMENT   Patient Name: [name] MRN: [id number] DOB:[birth year], 43 y.o., female Today's Date: 05/27/2024  END OF SESSION:  PT End of Session - 05/27/24 0916     Visit Number 2    Number of Visits 3    Date for PT Re-Evaluation 06/08/24    PT Start Time 0835    PT Stop Time 0913    PT Time Calculation (min) 38 min    Activity Tolerance Patient tolerated treatment well    Behavior During Therapy [place] for tasks assessed/performed           Past Medical History:  Diagnosis Date   Anxiety    Arthritis    Depression    Diabetes (HCC)    GERD (gastroesophageal reflux disease)    Hypercholesteremia    Sinus infection 03/27/2021   Past Surgical History:  Procedure Laterality Date   BIOPSY  04/02/2023   Procedure: BIOPSY;  Surgeon: Cindie Carlin POUR, DO;  [facility];  Service: Endoscopy;;   BREAST BIOPSY Left 04/03/2021   Procedure: BREAST BIOPSY; excision of duct lesion;  Surgeon: Kallie Manuelita BROCKS, MD;  Location: [facility];  Service: General;  Laterality: Left;   CHOLECYSTECTOMY N/A 07/13/2014   Procedure: LAPAROSCOPIC CHOLECYSTECTOMY;  Surgeon: Elsie GORMAN Holland, MD;  Location: [facility];  Service: General;  Laterality: N/A;   COLONOSCOPY WITH PROPOFOL  N/A 04/02/2023   Procedure: COLONOSCOPY WITH PROPOFOL ;  Surgeon: Cindie Carlin POUR, DO;  [facility];  Service: Endoscopy;  Laterality: N/A;  8:15 am, asa 3, pt knows to arrive at 6:00   DENTAL SURGERY  2018   plate top & bottom   DILATATION AND CURETTAGE/HYSTEROSCOPY WITH MINERVA N/A 04/16/2022   Procedure: DILATATION AND CURETTAGE/HYSTEROSCOPY WITH MINERVA;  Surgeon: Jayne Vonn DEL, MD;  Location: [facility];  Service: Gynecology;  Laterality: N/A;   ESOPHAGOGASTRODUODENOSCOPY (EGD) WITH PROPOFOL  N/A 04/02/2023   Procedure: ESOPHAGOGASTRODUODENOSCOPY (EGD) WITH PROPOFOL ;  Surgeon: Cindie Carlin POUR, DO;  [facility];  Service: Endoscopy;  Laterality: N/A;    GIVENS CAPSULE STUDY N/A 07/08/2023   Normal gastric mucosa, slow gastric emptying likely secondary to Trulicity  with incomplete study, few scattered areas of small bowel petechia, 1 possible small bowel erosion.   POLYPECTOMY  04/02/2023   Procedure: POLYPECTOMY;  Surgeon: Cindie Carlin POUR, DO;  [facility];  Service: Endoscopy;;   TUBAL LIGATION     Patient Active Problem List   Diagnosis Date Noted   Left lower quadrant abdominal pain 03/03/2024   Acute bacterial rhinosinusitis 02/01/2024   Dysphagia 03/05/2023   Gastroesophageal reflux disease 03/05/2023   Change in bowel habits 03/05/2023   Nausea without vomiting 03/05/2023   Loss of weight 03/05/2023   Iron deficiency anemia 03/04/2023   Diarrhea 02/18/2023   Abdominal pain, RUQ 02/18/2023   H/O seasonal allergies 08/25/2022   Type 2 diabetes mellitus without complications (HCC) 07/22/2022   Need for Tdap vaccination 07/22/2022   Menorrhagia with regular cycle    Vitamin D  deficiency 02/18/2022   Annual physical exam 02/18/2022   Prediabetes 02/18/2022   Refused influenza vaccine 12/17/2021   Hot flashes 12/17/2021   Bloody discharge from left nipple 03/21/2021   Right hip and buttock pain, ishial bursitis 07/20/2020   Anxiety 03/29/2019   Contact dermatitis 03/29/2019   Dysmenorrhea 06/30/2018   Hyperlipidemia 09/27/2015   Morbid obesity (HCC) 09/27/2015   Cigarette nicotine dependence, uncomplicated 09/27/2015   Cholecystitis with cholelithiasis 07/13/2014  PCP: Bevely Doffing, FNP   REFERRING PROVIDER: Brenna Lin, MD   REFERRING DIAG:  [id number] (ICD-10-CM) - Chronic right-sided low back pain with right-sided sciatica  M25.551 (ICD-10-CM) - Pain in right hip    Rationale for Evaluation and Treatment:  Rehabiliation  THERAPY DIAG:  Other low back pain  Pain in right leg  Muscle weakness (generalized)  ONSET DATE: Pain since January 2025   SUBJECTIVE:                                                                                                                                                                                            SUBJECTIVE STATEMENT: She says pain is no better. She got MRI approved and is scheduled for 06/01/24  PERTINENT HISTORY:  See above PMH  PAIN:  NPRS scale:7.5 /10 upon arrival Pain location:low back and right leg Pain description: constant, throbbing Aggravating factors: sweeping, vacuum, sitting Relieving factors: standing. Had no relief with ice, heat, muscle rub   PRECAUTIONS: ,  None  RED FLAGS: None   WEIGHT BEARING RESTRICTIONS:  No  FALLS:  Has patient fallen in last 6 months? No   OCCUPATION:  none  PLOF:  Independent  PATIENT GOALS:  Reduce pain  OBJECTIVE:  Note: Objective measures were completed at Evaluation unless otherwise noted.  DIAGNOSTIC FINDINGS:  XR show There is a compression fracture of L1, age indeterminate. MD would like to get a MRI but insurance requires PT first.  PATIENT SURVEYS:  Patient-Specific Activity Scoring Scheme  0 represents "unable to perform." 10 represents "able to perform at prior level. 0 1 2 3 4 5 6 7 8 9  10 (Date and Score)   Activity Eval     1. Sweeping/house cleaning 5    2.       3.     4.    5.    Score 5/10    Total score = sum of the activity scores/number of activities Minimum detectable change (90%CI) for average score = 2 points Minimum detectable change (90%CI) for single activity score = 3 points  GAIT: Assistive device utilized: None Level of assistance: Complete Independence Comments: antalgic gait on right with possible right leg shorter as she dips down on that side.    LUMBAR ROM:   Active  AROM  eval  Flexion 75%  Extension 50%  Right lateral flexion 25%  Left lateral flexion 25%  Right rotation 50%  Left rotation 50%   (Blank rows = not tested)   LOWER EXTREMITY MMT:    MMT Right eval Left eval  Hip  flexion 4+ 5  Hip extension  Hip abduction 4+ 5  Hip adduction    Hip internal rotation    Hip external rotation    Knee flexion 4+ 5  Knee extension 4+ 5  Ankle dorsiflexion    Ankle plantarflexion    Ankle inversion    Ankle eversion     (Blank rows = not tested)    SPECIAL TESTS:  Lumbar Straight leg raise test: Positive and Slump test: Positive for both of these tests on                                                                                                                              TREATMENT DATE:  05/27/24 Therex Nu step L4 LE/UE X 2 min then needed to stop due to pain Seated Pball roll outs for lumbar flexion stretch X 10 Seated ab set with pball isometric shoulder ext 5 sec X 10 and isometric hip flexion 5 sec X 10 each side Seated slump stretch X 10 bilat Seated piriformis stretch 30 sec X 3 bilat Standing hip extensions X 10 bilat Standing lumbar extensions X 10   PATIENT EDUCATION: Education details: HEP, PT plan of care, selfcare Person educated: Patient Education method: Explanation, Demonstration, Verbal cues, and Handouts Education comprehension: verbalized understanding, further education recommended   HOME EXERCISE PROGRAM: Access Code: [email or web address] URL: [email or web address] Date: 05/18/2024 Prepared by: Redell Moose  Exercises - Slump Stretch (Mirrored)  - 2 x daily - 6 x weekly - 1 sets - 10 reps - 3 hold - Seated Piriformis Stretch  - 2 x daily - 6 x weekly - 1 sets - 3 reps - 30 hold - Standing Lumbar Extension at Wall - Forearms  - 2 x daily - 6 x weekly - 1 sets - 10 reps - Standing Hip Extension  - 2 x daily - 6 x weekly - 1 sets - 10 reps - 5 sec hold - Sidelying Thoracic Rotation with Open Book  - 2 x daily - 6 x weekly - 1 sets - 10 reps - 5 hold  ASSESSMENT:  CLINICAL IMPRESSION: She continues to be in a lot of pain. She will have MRI scheduled 06/01/24. PT recommends to cancel her visit on 06/01/19 so we can have  MRI results to help guide treatment better as she has not improved with PT thus far.   OBJECTIVE IMPAIRMENTS: decreased activity tolerance for ADL's, difficulty walking,  decreased endurance, decreased mobility, decreased ROM, decreased strength, impaired flexibility, impaired LE use, and pain.  ACTIVITY LIMITATIONS: bending, liftting, walking, standing, cleaning, community activity,  PERSONAL FACTORS: see above PMH  also affecting patient's functional outcome.  REHAB POTENTIAL: Good  CLINICAL DECISION MAKING: Stable/uncomplicated  EVALUATION COMPLEXITY: Low    GOALS:   Long term PT goals Target date:06/15/2024   Pt will improve lumbar ROM to [facility] (at least 75%) to improve functional mobility Baseline: Goal status: New Pt will improve Rt leg strength to at least 5/5 MMT to improve  functional strength Baseline:4+ Goal status: New Pt will improve Patient specific functional scale (PSFS) to at least 7/10 to show improved function level Baseline:5 Goal status: New Pt will reduce pain to overall less than 3/10 with usual activity and work activity. Baseline:7.5 Goal status: New Pt will be I and compliant with HEP.  Goal status: New   PLAN: PT FREQUENCY: 1-2 times per week   PT DURATION: 4 weeks  PLANNED INTERVENTIONS  97110-Therapeutic exercises, 97530- Therapeutic activity, [id number]- Neuromuscular re-education, 97535- Self Care, [id number]- Manual therapy, [phone]- Gait training, [phone]- Electrical stimulation (unattended), [id number]- Ultrasound, 02987- Traction (mechanical), and 97033- Ionotophoresis 4mg /ml Dexamethasone   PLAN FOR NEXT SESSION: Look for MRI results   Redell JONELLE Moose, PT,DPT 05/27/2024, 9:17 AM

## 2024-05-28 ENCOUNTER — Ambulatory Visit (HOSPITAL_COMMUNITY)

## 2024-05-29 ENCOUNTER — Other Ambulatory Visit: Payer: Self-pay

## 2024-05-31 ENCOUNTER — Ambulatory Visit: Admitting: Physical Therapy

## 2024-06-01 ENCOUNTER — Ambulatory Visit
Admission: RE | Admit: 2024-06-01 | Discharge: 2024-06-01 | Disposition: A | Discharge status: Home / Self Care | Source: Ambulatory Visit | Attending: Orthopaedic Surgery | Admitting: Orthopaedic Surgery

## 2024-06-01 DIAGNOSIS — M5125 Other intervertebral disc displacement, thoracolumbar region: Secondary | ICD-10-CM | POA: Diagnosis not present

## 2024-06-01 DIAGNOSIS — M545 Low back pain, unspecified: Secondary | ICD-10-CM

## 2024-06-03 ENCOUNTER — Encounter: Payer: Self-pay | Admitting: Gastroenterology

## 2024-06-03 ENCOUNTER — Encounter: Payer: Self-pay | Admitting: Radiology

## 2024-06-03 NOTE — Progress Notes (Deleted)
 Referring Provider: No ref. provider found Primary Care Physician:  Bevely Doffing, FNP Primary GI Physician: Dr. PIERRETTE  No chief complaint on file.   HPI:   [name] is a 43 y.o. female  with history of GERD, constipation, IDA likely secondary to occult GI blood loss in the setting of chronic NSAID use.  She previously completed EGD, colonoscopy, givens capsule in 2024 as detailed in procedure history below.  She is presenting today for follow-up of constipation and RLQ abdominal/hip pain.  Last seen in the office 04/04/2024.  She was skipping 2 to 3 days between bowel movements.  Intermittent RLQ abdominal pain, going on a daily basis that she noticed when being more active or when straining to have a bowel movement.  Noted chronic right hip issues, previously receiving hip injections, but had not received one in about 1 year.  She was taking Amitiza  24 mcg twice daily and MiraLAX twice daily. Queried whether phentermine  was contributing to her constipation.  Recommended discussing other weight management options with PCP and plan to try her on Linzess  290 mcg.  Samples were provided.  Regarding her RLQ abdominal pain, she was found to have mild pain primarily along the iliac crest.  Suspected this was likely primarily driven by arthritis in her right hip and possibly compounded by constipation.  Would consider CT if no improvement with improvement in bowel function.  Also recommended following up with orthopedics.   Today: Constipation:   RLQ pain    Past Medical History:  Diagnosis Date   Anxiety    Arthritis    Depression    Diabetes (HCC)    GERD (gastroesophageal reflux disease)    Hypercholesteremia    Sinus infection 03/27/2021    Past Surgical History:  Procedure Laterality Date   BIOPSY  04/02/2023   Procedure: BIOPSY;  Surgeon: Cindie Carlin POUR, DO;  [facility];  Service: Endoscopy;;   BREAST BIOPSY Left 04/03/2021   Procedure: BREAST BIOPSY;  excision of duct lesion;  Surgeon: Kallie Manuelita BROCKS, MD;  Location: [facility];  Service: General;  Laterality: Left;   CHOLECYSTECTOMY N/A 07/13/2014   Procedure: LAPAROSCOPIC CHOLECYSTECTOMY;  Surgeon: Elsie GORMAN Holland, MD;  Location: [facility];  Service: General;  Laterality: N/A;   COLONOSCOPY WITH PROPOFOL  N/A 04/02/2023   Surgeon: Cindie Carlin POUR, DO; Nonbleeding internal hemorrhoids diverticulosis in the sigmoid and transverse colon, three 4-6 mm polyps resected and retrieved.  Recommended repeat colonoscopy in 5 years.  Pathology with tubular adenoma   DENTAL SURGERY  2018   plate top & bottom   DILATATION AND CURETTAGE/HYSTEROSCOPY WITH MINERVA N/A 04/16/2022   Procedure: DILATATION AND CURETTAGE/HYSTEROSCOPY WITH MINERVA;  Surgeon: Jayne Vonn DEL, MD;  Location: [facility];  Service: Gynecology;  Laterality: N/A;   ESOPHAGOGASTRODUODENOSCOPY (EGD) WITH PROPOFOL  N/A 04/02/2023   Surgeon: Cindie Carlin POUR, DO;  Normal esophagus, gastritis biopsied, normal examined duodenum.  Recommended PPI twice daily. Pathology was benign, no H. pylori.   GIVENS CAPSULE STUDY N/A 07/08/2023   Normal gastric mucosa, slow gastric emptying likely secondary to Trulicity  with incomplete study, few scattered areas of small bowel petechia, 1 possible small bowel erosion.  Patient preferred not to repeat study.   POLYPECTOMY  04/02/2023   Procedure: POLYPECTOMY;  Surgeon: Cindie Carlin POUR, DO;  [facility];  Service: Endoscopy;;   TUBAL LIGATION      Current Outpatient Medications  Medication Sig Dispense Refill   atorvastatin  (LIPITOR) 40 MG tablet  Take 1 tablet (40 mg total) by mouth daily. 90 tablet 3   citalopram  (CELEXA ) 40 MG tablet Take 1 tablet by mouth once daily 90 tablet 1   fenofibrate  (TRICOR ) 145 MG tablet Take 1 tablet by mouth once daily 90 tablet 1   fluticasone  (FLONASE ) 50 MCG/ACT nasal spray Place 1 spray into both nostrils 2 (two) times daily. 16 g 2   HYDROcodone -acetaminophen   (NORCO) 7.5-325 MG tablet One tablet every four hours as needed for pain.  Five day supply for acute pain per [facility]. 30 tablet 0   hydrOXYzine  (VISTARIL ) 25 MG capsule Take 1 capsule (25 mg total) by mouth every 8 (eight) hours as needed for anxiety. 30 capsule 2   linaclotide  (LINZESS ) 290 MCG CAPS capsule Take 1 capsule (290 mcg total) by mouth daily before breakfast. 90 capsule 3   lisinopril  (ZESTRIL ) 5 MG tablet Take 1 tablet by mouth once daily 30 tablet 0   megestrol  (MEGACE ) 40 MG tablet Take 1 tablet by mouth once daily 30 tablet 0   metFORMIN  (GLUCOPHAGE -XR) 500 MG 24 hr tablet Take 1 tablet (500 mg total) by mouth daily with breakfast. 90 tablet 3   methocarbamol  (ROBAXIN ) 500 MG tablet Take 1 tablet (500 mg total) by mouth 2 (two) times daily. 20 tablet 0   norethindrone  (MICRONOR ) 0.35 MG tablet Take 1 tablet by mouth once daily 84 tablet 3   omeprazole  (PRILOSEC) 40 MG capsule TAKE 1 CAPSULE BY MOUTH 1-2 TIMES DAILY BEFORE MEALS FOR HEARTBURN/REFLUX. 60 capsule 3   phentermine  37.5 MG capsule Take 1 capsule by mouth in the morning 30 capsule 0   predniSONE  (STERAPRED UNI-PAK 21 TAB) 5 MG (21) TBPK tablet Take 6 pills first day; 5 pills second day; 4 pills third day; 3 pills fourth day; 2 pills next day and 1 pill last day. 21 tablet 0   Vitamin D , Ergocalciferol , (DRISDOL ) 1.25 MG (50000 UNIT) CAPS capsule Take 1 capsule by mouth once a week 12 capsule 0   No current facility-administered medications for this visit.    Allergies as of 06/06/2024   (No Known Allergies)    Family History  Problem Relation Age of Onset   Heart disease Mother    Diabetes Mother    Heart disease Father    Hypertension Father    Thyroid disease Father    Hyperlipidemia Father    Breast cancer Paternal Grandmother    Breast cancer Paternal Aunt    Cancer - Colon Paternal Uncle    Colon cancer Neg Hx     Social History   Socioeconomic History   Marital status: Married    Spouse  name: Not on file   Number of children: 2   Years of education: Not on file   Highest education level: Not on file  Occupational History   Not on file  Tobacco Use   Smoking status: Former    Current packs/day: 0.00    Average packs/day: 0.5 packs/day for 15.0 years (7.5 ttl pk-yrs)    Types: Cigarettes    Start date: 11/13/2004    Quit date: 11/14/2019    Years since quitting: 4.5   Smokeless tobacco: Never  Vaping Use   Vaping status: Never Used  Substance and Sexual Activity   Alcohol use: No   Drug use: No   Sexual activity: Yes    Birth control/protection: Surgical, Pill  Other Topics Concern   Not on file  Social History Narrative   Lives  with her spouse and children.    Social Drivers of Corporate investment banker Strain: Low Risk  (03/14/2022)   Overall Financial Resource Strain (CARDIA)    Difficulty of Paying Living Expenses: Not hard at all  Food Insecurity: No Food Insecurity (03/14/2022)   Hunger Vital Sign    Worried About Running Out of Food in the Last Year: Never true    Ran Out of Food in the Last Year: Never true  Transportation Needs: No Transportation Needs (03/14/2022)   PRAPARE - Administrator, Civil Service (Medical): No    Lack of Transportation (Non-Medical): No  Physical Activity: Insufficiently Active (03/14/2022)   Exercise Vital Sign    Days of Exercise per Week: 5 days    Minutes of Exercise per Session: 20 min  Stress: No Stress Concern Present (03/14/2022)   [employer] of Occupational Health - Occupational Stress Questionnaire    Feeling of Stress : Only a little  Social Connections: Moderately Isolated (03/14/2022)   Social Connection and Isolation Panel    Frequency of Communication with Friends and Family: Twice a week    Frequency of Social Gatherings with Friends and Family: Never    Attends Religious Services: 1 to 4 times per year    Active Member of [employer] or Organizations: No    Attends Banker Meetings:  Never    Marital Status: Married    Review of Systems: Gen: Denies fever, chills, anorexia. Denies fatigue, weakness, weight loss.  CV: Denies chest pain, palpitations, syncope, peripheral edema, and claudication. Resp: Denies dyspnea at rest, cough, wheezing, coughing up blood, and pleurisy. GI: Denies vomiting blood, jaundice, and fecal incontinence.   Denies dysphagia or odynophagia. Derm: Denies rash, itching, dry skin Psych: Denies depression, anxiety, memory loss, confusion. No homicidal or suicidal ideation.  Heme: Denies bruising, bleeding, and enlarged lymph nodes.  Physical Exam: There were no vitals taken for this visit. General:   Alert and oriented. No distress noted. Pleasant and cooperative.  Head:  Normocephalic and atraumatic. Eyes:  Conjuctiva clear without scleral icterus. Heart:  S1, S2 present without murmurs appreciated. Lungs:  Clear to auscultation bilaterally. No wheezes, rales, or rhonchi. No distress.  Abdomen:  +BS, soft, non-tender and non-distended. No rebound or guarding. No HSM or masses noted. Msk:  Symmetrical without gross deformities. Normal posture. Extremities:  Without edema. Neurologic:  Alert and  oriented x4 Psych:  Normal mood and affect.    Assessment:     Plan:  ***   Josette Centers, PA-C [facility] 06/06/2024

## 2024-06-06 ENCOUNTER — Ambulatory Visit: Admitting: Gastroenterology

## 2024-06-07 ENCOUNTER — Emergency Department (HOSPITAL_COMMUNITY): Admission: EM | Admit: 2024-06-07 | Discharge: 2024-06-07 | Disposition: A | Discharge status: Home / Self Care

## 2024-06-07 ENCOUNTER — Emergency Department (HOSPITAL_COMMUNITY)

## 2024-06-07 ENCOUNTER — Other Ambulatory Visit: Payer: Self-pay

## 2024-06-07 ENCOUNTER — Encounter (HOSPITAL_COMMUNITY): Payer: Self-pay | Admitting: Emergency Medicine

## 2024-06-07 DIAGNOSIS — R1030 Lower abdominal pain, unspecified: Secondary | ICD-10-CM | POA: Diagnosis not present

## 2024-06-07 DIAGNOSIS — N134 Hydroureter: Secondary | ICD-10-CM | POA: Diagnosis not present

## 2024-06-07 DIAGNOSIS — Z7984 Long term (current) use of oral hypoglycemic drugs: Secondary | ICD-10-CM | POA: Insufficient documentation

## 2024-06-07 DIAGNOSIS — K5900 Constipation, unspecified: Secondary | ICD-10-CM | POA: Diagnosis not present

## 2024-06-07 DIAGNOSIS — E119 Type 2 diabetes mellitus without complications: Secondary | ICD-10-CM | POA: Insufficient documentation

## 2024-06-07 DIAGNOSIS — R1031 Right lower quadrant pain: Secondary | ICD-10-CM | POA: Diagnosis not present

## 2024-06-07 DIAGNOSIS — N132 Hydronephrosis with renal and ureteral calculous obstruction: Secondary | ICD-10-CM | POA: Diagnosis not present

## 2024-06-07 DIAGNOSIS — N202 Calculus of kidney with calculus of ureter: Secondary | ICD-10-CM

## 2024-06-07 LAB — URINALYSIS, ROUTINE W REFLEX MICROSCOPIC
Bilirubin Urine: NEGATIVE
Glucose, UA: NEGATIVE mg/dL
Ketones, ur: NEGATIVE mg/dL
Leukocytes,Ua: NEGATIVE
Nitrite: NEGATIVE
Protein, ur: NEGATIVE mg/dL
RBC / HPF: >50 RBC/hpf (ref 0–5)
Specific Gravity, Urine: >1.046 — ABNORMAL HIGH (ref 1.005–1.030)
pH: 6 (ref 5.0–8.0)

## 2024-06-07 LAB — CBC WITH DIFFERENTIAL/PLATELET
Abs Immature Granulocytes: 0.01 K/uL (ref 0.00–0.07)
Basophils Absolute: 0.1 K/uL (ref 0.0–0.1)
Basophils Relative: 1 %
Eosinophils Absolute: 0.1 K/uL (ref 0.0–0.5)
Eosinophils Relative: 2 %
HCT: 38.9 % (ref 36.0–46.0)
Hemoglobin: 13.4 g/dL (ref 12.0–15.0)
Immature Granulocytes: 0 %
Lymphocytes Relative: 29 %
Lymphs Abs: 1.6 K/uL (ref 0.7–4.0)
MCH: 31.8 pg (ref 26.0–34.0)
MCHC: 34.4 g/dL (ref 30.0–36.0)
MCV: 92.4 fL (ref 80.0–100.0)
Monocytes Absolute: 0.5 K/uL (ref 0.1–1.0)
Monocytes Relative: 9 %
Neutro Abs: 3 K/uL (ref 1.7–7.7)
Neutrophils Relative %: 59 %
Platelets: 306 K/uL (ref 150–400)
RBC: 4.21 MIL/uL (ref 3.87–5.11)
RDW: 13.4 % (ref 11.5–15.5)
WBC: 5.3 K/uL (ref 4.0–10.5)
nRBC: 0 % (ref 0.0–0.2)

## 2024-06-07 LAB — COMPREHENSIVE METABOLIC PANEL WITH GFR
ALT: 35 U/L (ref 0–44)
AST: 28 U/L (ref 15–41)
Albumin: 4.1 g/dL (ref 3.5–5.0)
Alkaline Phosphatase: 33 U/L — ABNORMAL LOW (ref 38–126)
Anion gap: 12 (ref 5–15)
BUN: 15 mg/dL (ref 6–20)
CO2: 18 mmol/L — ABNORMAL LOW (ref 22–32)
Calcium: 9.9 mg/dL (ref 8.9–10.3)
Chloride: 110 mmol/L (ref 98–111)
Creatinine, Ser: 1.08 mg/dL — ABNORMAL HIGH (ref 0.44–1.00)
GFR, Estimated: >60 mL/min (ref >60)
Glucose, Bld: 144 mg/dL — ABNORMAL HIGH (ref 70–99)
Potassium: 3.4 mmol/L — ABNORMAL LOW (ref 3.5–5.1)
Sodium: 140 mmol/L (ref 135–145)
Total Bilirubin: 0.6 mg/dL (ref 0.0–1.2)
Total Protein: 7.3 g/dL (ref 6.5–8.1)

## 2024-06-07 LAB — HCG, SERUM, QUALITATIVE: Preg, Serum: NEGATIVE

## 2024-06-07 LAB — LIPASE, BLOOD: Lipase: 34 U/L (ref 11–51)

## 2024-06-07 MED ORDER — OXYCODONE-ACETAMINOPHEN 5-325 MG PO TABS: 1.0000 | ORAL_TABLET | Freq: Four times a day (QID) | ORAL | 0 refills | Status: DC | PRN

## 2024-06-07 MED ORDER — ONDANSETRON HCL 4 MG/2ML IJ SOLN
4.0000 mg | Freq: Once | INTRAMUSCULAR | Status: AC
  Administered 2024-06-07: 4 mg via INTRAVENOUS
  Filled 2024-06-07: qty 2

## 2024-06-07 MED ORDER — ONDANSETRON HCL 4 MG PO TABS: 4.0000 mg | ORAL_TABLET | Freq: Four times a day (QID) | ORAL | 0 refills | Status: AC

## 2024-06-07 MED ORDER — MORPHINE SULFATE (PF) 4 MG/ML IV SOLN
4.0000 mg | Freq: Once | INTRAVENOUS | Status: AC
  Administered 2024-06-07: 4 mg via INTRAVENOUS
  Filled 2024-06-07: qty 1

## 2024-06-07 MED ORDER — HYDROMORPHONE HCL 1 MG/ML IJ SOLN
1.0000 mg | Freq: Once | INTRAMUSCULAR | Status: AC
  Administered 2024-06-07: 1 mg via INTRAVENOUS
  Filled 2024-06-07: qty 1

## 2024-06-07 MED ORDER — NAPROXEN 500 MG PO TABS: 500.0000 mg | ORAL_TABLET | Freq: Two times a day (BID) | ORAL | 0 refills | Status: DC

## 2024-06-07 MED ORDER — IOHEXOL 300 MG/ML  SOLN
100.0000 mL | Freq: Once | INTRAMUSCULAR | Status: AC | PRN
  Administered 2024-06-07: 100 mL via INTRAVENOUS

## 2024-06-07 MED ORDER — LACTATED RINGERS IV BOLUS
1000.0000 mL | Freq: Once | INTRAVENOUS | Status: AC
  Administered 2024-06-07: 1000 mL via INTRAVENOUS

## 2024-06-07 MED ORDER — TAMSULOSIN HCL 0.4 MG PO CAPS: 0.4000 mg | ORAL_CAPSULE | Freq: Every day | ORAL | 0 refills | Status: AC

## 2024-06-07 NOTE — ED Provider Notes (Signed)
 [facility] DEPARTMENT AT [facility] Provider Note   CSN: [id number] Arrival date & time: 06/07/24  9149     Patient presents with: Abdominal Pain   [name] is a 43 y.o. female.   Abdominal Pain Associated symptoms: nausea   Patient is a 43 year old female senting today with acute onset right lower quadrant abdominal pain that began suddenly this a.m. accompanied with nausea and 1 episode of diarrhea.  Patient in obvious discomfort and is having difficulty answering questions secondary to distracting pain.  Pres medical history of HLD, diabetes, anxiety, GERD, arthritis, status post cholecystectomy and endometrial ablation.  Denies alcohol use, drug use, marijuana use.  Denies fever, headache, vision changes, chest pain, shortness of breath, cough, congestion, vomiting, dysuria, hematuria, vaginal bleeding, vaginal discharge, vaginal pain, hematochezia, melena, lower leg swelling.     Prior to Admission medications   Medication Sig Start Date End Date Taking? Authorizing Provider  naproxen  (NAPROSYN ) 500 MG tablet Take 1 tablet (500 mg total) by mouth 2 (two) times daily. 06/07/24  Yes Cynethia Schindler S, PA-C  ondansetron  (ZOFRAN ) 4 MG tablet Take 1 tablet (4 mg total) by mouth every 6 (six) hours. 06/07/24  Yes Eniola Cerullo S, PA-C  oxyCODONE -acetaminophen  (PERCOCET/ROXICET) 5-325 MG tablet Take 1 tablet by mouth every 6 (six) hours as needed for severe pain (pain score 7-10). 06/07/24  Yes Beola Terrall RAMAN, PA-C  tamsulosin  (FLOMAX ) 0.4 MG CAPS capsule Take 1 capsule (0.4 mg total) by mouth daily. 06/07/24  Yes Beola Terrall RAMAN, PA-C  atorvastatin  (LIPITOR) 40 MG tablet Take 1 tablet (40 mg total) by mouth daily. 10/18/23   Melvenia Manus BRAVO, MD  citalopram  (CELEXA ) 40 MG tablet Take 1 tablet by mouth once daily 04/29/24   Bevely Doffing, FNP  fenofibrate  (TRICOR ) 145 MG tablet Take 1 tablet by mouth once daily 04/29/24   Bevely Doffing, FNP  fluticasone  (FLONASE )  50 MCG/ACT nasal spray Place 1 spray into both nostrils 2 (two) times daily. 01/29/24   Stuart Vernell Norris, PA-C  HYDROcodone -acetaminophen  (NORCO) 7.5-325 MG tablet One tablet every four hours as needed for pain.  Five day supply for acute pain per [facility]. 05/25/24   Brenna Lin, MD  hydrOXYzine  (VISTARIL ) 25 MG capsule Take 1 capsule (25 mg total) by mouth every 8 (eight) hours as needed for anxiety. 01/12/24   Melvenia Manus BRAVO, MD  linaclotide  (LINZESS ) 290 MCG CAPS capsule Take 1 capsule (290 mcg total) by mouth daily before breakfast. 05/12/24   Harper, Kristen S, PA-C  lisinopril  (ZESTRIL ) 5 MG tablet Take 1 tablet by mouth once daily 05/18/24   Bevely Doffing, FNP  megestrol  (MEGACE ) 40 MG tablet Take 1 tablet by mouth once daily 05/30/24   Bevely Doffing, FNP  metFORMIN  (GLUCOPHAGE -XR) 500 MG 24 hr tablet Take 1 tablet (500 mg total) by mouth daily with breakfast. 10/16/23   Melvenia Manus BRAVO, MD  methocarbamol  (ROBAXIN ) 500 MG tablet Take 1 tablet (500 mg total) by mouth 2 (two) times daily. 03/24/24   Leath-Warren, Etta PARAS, NP  norethindrone  (MICRONOR ) 0.35 MG tablet Take 1 tablet by mouth once daily 03/07/24   Jayne Vonn DEL, MD  omeprazole  (PRILOSEC) 40 MG capsule TAKE 1 CAPSULE BY MOUTH 1-2 TIMES DAILY BEFORE MEALS FOR HEARTBURN/REFLUX. 04/19/24   Rudy Josette RAMAN, PA-C  phentermine  37.5 MG capsule Take 1 capsule by mouth in the morning 05/27/24   Bevely Doffing, FNP  predniSONE  (STERAPRED UNI-PAK 21 TAB) 5 MG (21) TBPK tablet  Take 6 pills first day; 5 pills second day; 4 pills third day; 3 pills fourth day; 2 pills next day and 1 pill last day. 04/27/24   Brenna Lin, MD  Vitamin D , Ergocalciferol , (DRISDOL ) 1.25 MG (50000 UNIT) CAPS capsule Take 1 capsule by mouth once a week 04/01/24   Dixon, Phillip E, MD    Allergies: Patient has no known allergies.    Review of Systems  Gastrointestinal:  Positive for abdominal pain and nausea.  All other systems reviewed and are  negative.   Updated Vital Signs BP (!) 146/86   Pulse (!) 58   Temp (!) 97.5 F (36.4 C) (Oral)   Resp 16   Ht 5' 5 (1.651 m)   Wt 108.9 kg   SpO2 99%   BMI 39.94 kg/m   Physical Exam Vitals and nursing note reviewed.  Constitutional:      General: She is not in acute distress.    Appearance: Normal appearance. She is not ill-appearing or diaphoretic.  HENT:     Head: Normocephalic and atraumatic.  Eyes:     General: No scleral icterus.       Right eye: No discharge.        Left eye: No discharge.     Extraocular Movements: Extraocular movements intact.     Conjunctiva/sclera: Conjunctivae normal.  Cardiovascular:     Rate and Rhythm: Normal rate and regular rhythm.     Pulses: Normal pulses.     Heart sounds: Normal heart sounds. No murmur heard.    No friction rub. No gallop.  Pulmonary:     Effort: Pulmonary effort is normal. No respiratory distress.     Breath sounds: No stridor. No wheezing, rhonchi or rales.  Chest:     Chest wall: No tenderness.  Abdominal:     General: Abdomen is flat. There is no distension.     Palpations: Abdomen is soft.     Tenderness: There is abdominal tenderness in the right lower quadrant. There is guarding. There is no right CVA tenderness, left CVA tenderness or rebound. Positive signs include McBurney's sign.     Hernia: No hernia is present.  Musculoskeletal:        General: No swelling, deformity or signs of injury.     Cervical back: Normal range of motion. No rigidity.     Right lower leg: No edema.     Left lower leg: No edema.  Skin:    General: Skin is warm and dry.     Findings: No bruising, erythema or lesion.  Neurological:     General: No focal deficit present.     Mental Status: She is alert and oriented to person, place, and time. Mental status is at baseline.     Sensory: No sensory deficit.     Motor: No weakness.  Psychiatric:        Mood and Affect: Mood normal.     (all labs ordered are listed, but  only abnormal results are displayed) Labs Reviewed  COMPREHENSIVE METABOLIC PANEL WITH GFR - Abnormal; Notable for the following components:      Result Value   Potassium 3.4 (*)    CO2 18 (*)    Glucose, Bld 144 (*)    Creatinine, Ser 1.08 (*)    Alkaline Phosphatase 33 (*)    All other components within normal limits  URINALYSIS, ROUTINE W REFLEX MICROSCOPIC - Abnormal; Notable for the following components:   APPearance HAZY (*)  Specific Gravity, Urine >1.046 (*)    Hgb urine dipstick LARGE (*)    Bacteria, UA RARE (*)    All other components within normal limits  URINE CULTURE  CBC WITH DIFFERENTIAL/PLATELET  LIPASE, BLOOD  HCG, SERUM, QUALITATIVE    EKG: EKG Interpretation Date/Time:  Tuesday June 07 2024 09:15:24 EDT Ventricular Rate:  62 PR Interval:  112 QRS Duration:  105 QT Interval:  475 QTC Calculation: 483 R Axis:   44  Text Interpretation: Sinus rhythm Borderline short PR interval Confirmed by Ula Barter [phone]) on 06/07/2024 9:19:53 AM  Radiology: CT ABDOMEN PELVIS W CONTRAST Result Date: 06/07/2024 CLINICAL DATA:  Right lower quadrant abdominal pain for 2 hours, history of constipation EXAM: CT ABDOMEN AND PELVIS WITH CONTRAST TECHNIQUE: Multidetector CT imaging of the abdomen and pelvis was performed using the standard protocol following bolus administration of intravenous contrast. RADIATION DOSE REDUCTION: This exam was performed according to the departmental dose-optimization program which includes automated exposure control, adjustment of the mA and/or kV according to patient size and/or use of iterative reconstruction technique. CONTRAST:  OMNIPAQUE  IOHEXOL  300 MG/ML  SOLN COMPARISON:  None Available. FINDINGS: Lower chest: No acute abnormality.  Coronary artery calcifications. Hepatobiliary: No focal liver abnormality is seen. Status post cholecystectomy. No biliary dilatation. Pancreas: Unremarkable. No pancreatic ductal dilatation or surrounding  inflammatory changes. Spleen: Normal in size without significant abnormality. Adrenals/Urinary Tract: Adrenal glands are unremarkable. There is a 0.2 cm calculus at the right ureterovesicular junction with mild right hydronephrosis and hydroureter with a mildly delayed right nephrogram (series 2, image 77). No left-sided calculi, ureteral calculi, or hydronephrosis. Bladder is unremarkable. Stomach/Bowel: Stomach is within normal limits. Appendix appears normal. No evidence of bowel wall thickening, distention, or inflammatory changes. Vascular/Lymphatic: Aortic atherosclerosis. No enlarged abdominal or pelvic lymph nodes. Reproductive: No mass or other significant abnormality. Other: No abdominal wall hernia or abnormality. No ascites. Musculoskeletal: No acute or significant osseous findings. IMPRESSION: 1. There is a 0.2 cm calculus at the right ureterovesicular junction with mild right hydronephrosis and hydroureter with a mildly delayed right nephrogram. 2. No left-sided calculi, ureteral calculi, or hydronephrosis. 3. Status post cholecystectomy. 4. Coronary artery disease, advanced for patient age. 5. Aortic atherosclerosis, advanced for patient. Aortic Atherosclerosis (ICD10-I70.0). Electronically Signed   By: Marolyn JONETTA Jaksch M.D.   On: 06/07/2024 11:38   Procedures   Medications Ordered in the ED  morphine  (PF) 4 MG/ML injection 4 mg (4 mg Intravenous Given 06/07/24 0928)  ondansetron  (ZOFRAN ) injection 4 mg (4 mg Intravenous Given 06/07/24 0928)  HYDROmorphone  (DILAUDID ) injection 1 mg (1 mg Intravenous Given 06/07/24 1015)  iohexol  (OMNIPAQUE ) 300 MG/ML solution 100 mL (100 mLs Intravenous Contrast Given 06/07/24 1037)  lactated ringers  bolus 1,000 mL (0 mLs Intravenous Stopped 06/07/24 1352)                                Medical Decision Making Amount and/or Complexity of Data Reviewed Labs: ordered. Radiology: ordered.  Risk Prescription drug management.   This patient is a 43 year old  female who presents to the ED for concern of right lower quad abdominal pain starting acutely this a.m. and accompanied with emesis secondary to pain per patient.  Noted to have episode of diarrhea with no other symptoms otherwise.  On physical exam, patient is afebrile, alert and orient x 4, speaking in full sentences, nontachypneic, nontachycardic.  Notably distressed secondary to pain.  Right lower  quadrant abdominal tenderness noted to palpation, with McBurney point tenderness.  No CVA tenderness.  With exam otherwise unremarkable.  Abdominal labs and CT were ordered.  CT scan revealed a 2 mm stone at the UVJ with mild hydroureter and hydronephrosis.  Lab results were otherwise unremarkable outside of what appears to be dehydration with patient endorsing low fluid intake, elevated physical gravity and mild elevated creatinine.  Provided 1 L of fluid.  Pain control.  On reevaluation, patient's pain is controlled.  UA does not show any signs of UTI.  Will have her continue to follow-up with urology as needed and we will treat with nausea, pain medication and Flomax  to help facilitate stone excretion.  Patient is wishing to go at this time.  Patient vital signs have remained stable throughout the course of patient's time in the ED. Low suspicion for any other emergent pathology at this time. I believe this patient is safe to be discharged. Provided strict return to ER precautions. Patient expressed agreement and understanding of plan. All questions were answered.  Differential diagnoses prior to evaluation: The emergent differential diagnosis includes, but is not limited to, appendicitis, Diverticulitis, Gastritis, Enteritis, Incarcerated or Strangulated Hernia, Nephrolithiasis , Pyelonephritis, Inflammatory Bowel Disease, Mesenteric Adenitis, Gastroenteritis, Constipation,  Endometriosis, Ectopic Pregnancy, Ovarian Torsion, Ruptured Ovarian Cyst, Pelvic Inflammatory Disease (PID), Tubo-Ovarian Abscess,  Mittelschmerz. This is not an exhaustive differential.   Past Medical History / Co-morbidities / Social History: HLD, arthritis, diabetes, anxiety, status post cholecystectomy, status post endometrial ablation   Additional history: Chart reviewed. Pertinent results include:   Last seen by orthopedic surgery on 05/25/2024 for chronic lumbar pain, patient still undergoing PT.  Lab Tests/Imaging studies: I personally interpreted labs/imaging and the pertinent results include:  .  CT noted a 2 mm calculus at the right UVJ as well as noting mild right hydronephrosis and mild hydroureter with no other acute findings.. CBC unremarkable CMP shows a mildly elevated creatinine but otherwise no acute findings Lipase markable Serum pregnancy is negative UA shows elevated specific gravity likely secondary to dehydration accompanied with large hemoglobin and no signs of UTI   I agree with the radiologist interpretation.  Cardiac monitoring: EKG obtained and interpreted by myself and attending physician which shows: Sinus rhythm with borderline Short PR interval  EKG Interpretation Date/Time:  Tuesday June 07 2024 09:15:24 EDT Ventricular Rate:  62 PR Interval:  112 QRS Duration:  105 QT Interval:  475 QTC Calculation: 483 R Axis:   44  Text Interpretation: Sinus rhythm Borderline short PR interval Confirmed by Ula Barter [phone]) on 06/07/2024 9:19:53 AM          Medications: I ordered medication including morphine , Zofran , Dilaudid , LR.  I have reviewed the patients home medicines and have made adjustments as needed.  Critical Interventions: None  Social Determinants of Health: None  Disposition: After consideration of the diagnostic results and the patients response to treatment, I feel that the patient would benefit from discharge and treatment as above.   emergency department workup does not suggest an emergent condition requiring admission or immediate intervention beyond  what has been performed at this time. The plan is: Follow-up with urology as needed, pain control nausea control at home, return for new or worsening symptoms. The patient is safe for discharge and has been instructed to return immediately for worsening symptoms, change in symptoms or any other concerns.  Final diagnoses:  Calculus of kidney with calculus of ureter  Hydronephrosis with urinary obstruction due to renal  calculus    ED Discharge Orders          Ordered    oxyCODONE -acetaminophen  (PERCOCET/ROXICET) 5-325 MG tablet  Every 6 hours PRN        06/07/24 1506    naproxen  (NAPROSYN ) 500 MG tablet  2 times daily        06/07/24 1506    tamsulosin  (FLOMAX ) 0.4 MG CAPS capsule  Daily        06/07/24 1506    ondansetron  (ZOFRAN ) 4 MG tablet  Every 6 hours        06/07/24 1506               Beola Terrall RAMAN, [place] 06/07/24 1510    Ula Prentice SAUNDERS, MD 06/07/24 [phone]

## 2024-06-07 NOTE — ED Triage Notes (Signed)
 Pt bib pov w/ c/o of right lower abd pain X 2 hours. Pt reports a hx of constipation.

## 2024-06-07 NOTE — ED Notes (Signed)
 Pt given diet soda on request and encouraged to obtain urine specimen

## 2024-06-07 NOTE — Discharge Instructions (Signed)
 You are seen today for kidney stones.  You have a 2 mm stone that is passing, and is the cause of your pain today.  I am sending in some nausea medication, 2 forms of pain medication.  Will recommend that you continue to try to control pain with Tylenol  and naproxen , however if pain is uncontrolled, use the Percocet I am providing.  Please use this sparingly as this can cause constipation, bad dreams as well as risk for addiction.  I have sent out a urine culture, they will reach out to you if anything comes back positive.  Otherwise we will have you follow-up with urology, with information provided.  Additionally him also providing some indication to help with passage of the stone.  Return to ED though for any uncontrolled pain, fever, or worsening symptoms.

## 2024-06-08 ENCOUNTER — Ambulatory Visit (HOSPITAL_COMMUNITY)

## 2024-06-08 ENCOUNTER — Telehealth: Payer: Self-pay | Admitting: Orthopaedic Surgery

## 2024-06-08 MED ORDER — HYDROCODONE-ACETAMINOPHEN 7.5-325 MG PO TABS: 1.0000 | ORAL_TABLET | Freq: Four times a day (QID) | ORAL | 0 refills | Status: AC | PRN

## 2024-06-08 NOTE — Telephone Encounter (Signed)
 Dr. Janae pt - spoke w/the pt, she had her MRI done 06/01/24, it has not finalized yet.  She scheduled for 06/15/24 to review.  She is requesting a refill for Hydrocodone  7.5-325 to be sent to [facility]

## 2024-06-08 NOTE — Telephone Encounter (Signed)
 Called to get the MRI read

## 2024-06-09 NOTE — Progress Notes (Unsigned)
 Referring Provider: Bevely Doffing, FNP Primary Care Physician:  Bevely Doffing, FNP Primary GI Physician: Dr. Cindie  Chief Complaint  Patient presents with   Follow-up    Pt feels somewhat better she states    HPI:   [name] is a 43 y.o. female  with history of GERD, constipation, IDA likely secondary to occult GI blood loss in the setting of chronic NSAID use, now resolved.  She previously completed EGD, colonoscopy, givens capsule in 2024 as detailed in procedure history below.  She is presenting today for follow-up of constipation and RLQ abdominal/hip pain.   Last seen in the office 04/04/2024.  She was skipping 2 to 3 days between bowel movements.  Intermittent RLQ abdominal pain, going on a daily basis that she noticed when being more active or when straining to have a bowel movement.  Noted chronic right hip issues, previously receiving hip injections, but had not received one in about 1 year.  She was taking Amitiza  24 mcg twice daily and MiraLAX twice daily. Queried whether phentermine  was contributing to her constipation.  Recommended discussing other weight management options with PCP and plan to try her on Linzess  290 mcg.  Samples were provided.  Regarding her RLQ abdominal pain, she was found to have mild pain primarily along the iliac crest.  Suspected this was likely primarily driven by arthritis in her right hip and possibly compounded by constipation.  Would consider CT if no improvement with improvement in bowel function.  Also recommended following up with orthopedics.     Today: Constipation: Well controlled with Linzess  290 mcg daily. No brbpr or melena.    RLQ pain: Intermittent. States she found out that it is secondary to her back. She had follow-up with ortho and has since had an MRI of her back as detailed below.  She is waiting to her back from her doctor on results and plan.  She also recently had CT abdomen pelvis 06/07/2024 showing a small right  ureterovesicular junction stone with moderate hydronephrosis and hydroureter.   MRI lumbar spine without contrast 06/01/2024: 1. L4-5: Bilateral facet osteoarthritis with edematous change. This could be a cause of back pain or referred facet syndrome pain. No compressive stenosis. 2. L2-3: Shallow protrusion of the disc. Mild foraminal encroachment on the right, but no definite neural compression. 3. Chronic endplate Schmorl's nodes at T11-12, T12-L1 and L1-2 but without any edema to suggest that any are recent or active.   No other GI concerns.   Past Medical History:  Diagnosis Date   Anxiety    Arthritis    Depression    Diabetes (HCC)    GERD (gastroesophageal reflux disease)    Hypercholesteremia    Sinus infection 03/27/2021    Past Surgical History:  Procedure Laterality Date   BIOPSY  04/02/2023   Procedure: BIOPSY;  Surgeon: Cindie Carlin POUR, DO;  [facility]: [facility];  Service: Endoscopy;;   BREAST BIOPSY Left 04/03/2021   Procedure: BREAST BIOPSY; excision of duct lesion;  Surgeon: Kallie Manuelita BROCKS, MD;  Location: [facility];  Service: General;  Laterality: Left;   CHOLECYSTECTOMY N/A 07/13/2014   Procedure: LAPAROSCOPIC CHOLECYSTECTOMY;  Surgeon: Elsie GORMAN Holland, MD;  Location: [facility];  Service: General;  Laterality: N/A;   COLONOSCOPY WITH PROPOFOL  N/A 04/02/2023   Surgeon: Cindie Carlin POUR, DO; Nonbleeding internal hemorrhoids diverticulosis in the sigmoid and transverse colon, three 4-6 mm polyps resected and retrieved.  Recommended repeat colonoscopy in 5 years.  Pathology with tubular adenoma   DENTAL SURGERY  2018   plate top & bottom   DILATATION AND CURETTAGE/HYSTEROSCOPY WITH MINERVA N/A 04/16/2022   Procedure: DILATATION AND CURETTAGE/HYSTEROSCOPY WITH MINERVA;  Surgeon: Jayne Vonn DEL, MD;  Location: [facility];  Service: Gynecology;  Laterality: N/A;   ESOPHAGOGASTRODUODENOSCOPY (EGD) WITH PROPOFOL  N/A 04/02/2023   Surgeon: Cindie Carlin POUR, DO;   Normal esophagus, gastritis biopsied, normal examined duodenum.  Recommended PPI twice daily. Pathology was benign, no H. pylori.   GIVENS CAPSULE STUDY N/A 07/08/2023   Normal gastric mucosa, slow gastric emptying likely secondary to Trulicity  with incomplete study, few scattered areas of small bowel petechia, 1 possible small bowel erosion.  Patient preferred not to repeat study.   POLYPECTOMY  04/02/2023   Procedure: POLYPECTOMY;  Surgeon: Cindie Carlin POUR, DO;  [facility]: [facility];  Service: Endoscopy;;   TUBAL LIGATION      Current Outpatient Medications  Medication Sig Dispense Refill   atorvastatin  (LIPITOR) 40 MG tablet Take 1 tablet (40 mg total) by mouth daily. 90 tablet 3   citalopram  (CELEXA ) 40 MG tablet Take 1 tablet by mouth once daily 90 tablet 1   fenofibrate  (TRICOR ) 145 MG tablet Take 1 tablet by mouth once daily 90 tablet 1   fluticasone  (FLONASE ) 50 MCG/ACT nasal spray Place 1 spray into both nostrils 2 (two) times daily. 16 g 2   HYDROcodone -acetaminophen  (NORCO) 7.5-325 MG tablet One tablet every four hours as needed for pain.  Five day supply for acute pain per [facility]. 30 tablet 0   HYDROcodone -acetaminophen  (NORCO) 7.5-325 MG tablet Take 1 tablet by mouth every 6 (six) hours as needed for up to 7 days. One every six hours as needed for pain.  Seven day limit 28 tablet 0   hydrOXYzine  (VISTARIL ) 25 MG capsule Take 1 capsule (25 mg total) by mouth every 8 (eight) hours as needed for anxiety. 30 capsule 2   lisinopril  (ZESTRIL ) 5 MG tablet Take 1 tablet by mouth once daily 30 tablet 0   megestrol  (MEGACE ) 40 MG tablet Take 1 tablet by mouth once daily 30 tablet 0   metFORMIN  (GLUCOPHAGE -XR) 500 MG 24 hr tablet Take 1 tablet (500 mg total) by mouth daily with breakfast. 90 tablet 3   methocarbamol  (ROBAXIN ) 500 MG tablet Take 1 tablet (500 mg total) by mouth 2 (two) times daily. 20 tablet 0   norethindrone  (MICRONOR ) 0.35 MG tablet Take 1 tablet by mouth once  daily 84 tablet 3   omeprazole  (PRILOSEC) 40 MG capsule TAKE 1 CAPSULE BY MOUTH 1-2 TIMES DAILY BEFORE MEALS FOR HEARTBURN/REFLUX. 60 capsule 3   ondansetron  (ZOFRAN ) 4 MG tablet Take 1 tablet (4 mg total) by mouth every 6 (six) hours. 12 tablet 0   oxyCODONE -acetaminophen  (PERCOCET/ROXICET) 5-325 MG tablet Take 1 tablet by mouth every 6 (six) hours as needed for severe pain (pain score 7-10). 15 tablet 0   phentermine  37.5 MG capsule Take 1 capsule by mouth in the morning 30 capsule 0   tamsulosin  (FLOMAX ) 0.4 MG CAPS capsule Take 1 capsule (0.4 mg total) by mouth daily. 30 capsule 0   Vitamin D , Ergocalciferol , (DRISDOL ) 1.25 MG (50000 UNIT) CAPS capsule Take 1 capsule by mouth once a week 12 capsule 0   linaclotide  (LINZESS ) 290 MCG CAPS capsule Take 1 capsule (290 mcg total) by mouth daily before breakfast. 90 capsule 3   naproxen  (NAPROSYN ) 500 MG tablet Take 1 tablet (500 mg total) by mouth 2 (two)  times daily. (Patient not taking: Reported on 06/10/2024) 30 tablet 0   predniSONE  (STERAPRED UNI-PAK 21 TAB) 5 MG (21) TBPK tablet Take 6 pills first day; 5 pills second day; 4 pills third day; 3 pills fourth day; 2 pills next day and 1 pill last day. (Patient not taking: Reported on 06/10/2024) 21 tablet 0   No current facility-administered medications for this visit.    Allergies as of 06/10/2024   (No Known Allergies)    Family History  Problem Relation Age of Onset   Heart disease Mother    Diabetes Mother    Heart disease Father    Hypertension Father    Thyroid disease Father    Hyperlipidemia Father    Breast cancer Paternal Grandmother    Breast cancer Paternal Aunt    Cancer - Colon Paternal Uncle    Colon cancer Neg Hx     Social History   Socioeconomic History   Marital status: Married    Spouse name: Not on file   Number of children: 2   Years of education: Not on file   Highest education level: Not on file  Occupational History   Not on file  Tobacco Use    Smoking status: Former    Current packs/day: 0.00    Average packs/day: 0.5 packs/day for 15.0 years (7.5 ttl pk-yrs)    Types: Cigarettes    Start date: 11/13/2004    Quit date: 11/14/2019    Years since quitting: 4.5   Smokeless tobacco: Never  Vaping Use   Vaping status: Never Used  Substance and Sexual Activity   Alcohol use: No   Drug use: No   Sexual activity: Yes    Birth control/protection: Surgical, Pill  Other Topics Concern   Not on file  Social History Narrative   Lives with her spouse and children.    Social Drivers of Corporate investment banker Strain: Low Risk  (03/14/2022)   Overall Financial Resource Strain (CARDIA)    Difficulty of Paying Living Expenses: Not hard at all  Food Insecurity: No Food Insecurity (03/14/2022)   Hunger Vital Sign    Worried About Running Out of Food in the Last Year: Never true    Ran Out of Food in the Last Year: Never true  Transportation Needs: No Transportation Needs (03/14/2022)   PRAPARE - Administrator, Civil Service (Medical): No    Lack of Transportation (Non-Medical): No  Physical Activity: Insufficiently Active (03/14/2022)   Exercise Vital Sign    Days of Exercise per Week: 5 days    Minutes of Exercise per Session: 20 min  Stress: No Stress Concern Present (03/14/2022)   [employer] of Occupational Health - Occupational Stress Questionnaire    Feeling of Stress : Only a little  Social Connections: Moderately Isolated (03/14/2022)   Social Connection and Isolation Panel    Frequency of Communication with Friends and Family: Twice a week    Frequency of Social Gatherings with Friends and Family: Never    Attends Religious Services: 1 to 4 times per year    Active Member of [employer] or Organizations: No    Attends Engineer, structural: Never    Marital Status: Married    Review of Systems: Gen: Denies fever, chills, cold or flulike symptoms, presyncope, syncope. CV: Denies chest pain,  palpitation. Resp: Denies dyspnea, cough.  GI: See HPI Heme: See HPI  Physical Exam: BP 126/78   Pulse 76   Temp  98.6 F (37 C)   Ht 5' 5 (1.651 m)   Wt 253 lb 9.6 oz (115 kg)   BMI 42.20 kg/m  General:   Alert and oriented. No distress noted. Pleasant and cooperative.  Head:  Normocephalic and atraumatic. Eyes:  Conjuctiva clear without scleral icterus. Heart:  S1, S2 present without murmurs appreciated. Lungs:  Clear to auscultation bilaterally. No wheezes, rales, or rhonchi. No distress.  Abdomen:  +BS, soft, non-tender and non-distended. No rebound or guarding. No HSM or masses noted. Msk:  Symmetrical without gross deformities. Normal posture. Extremities:  Without edema. Neurologic:  Alert and  oriented x4 Psych:  Normal mood and affect.    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  Chronic constipation: Well-controlled with Linzess  290 mcg daily.  Previously on Amitiza  24 mcg twice daily and MiraLAX twice daily without adequate management.  No alarm symptoms.  Colonoscopy up-to-date in June 2024, due for surveillance in 2029.  RLQ pain/hip pain: Likely multifactorial secondary to MSK etiology with radicular pain coming from her lumbar spine as her pain occurs primarily when being more active as well as recent finding of right ureterovesicular calculus.  Regarding her lumbar spine issues, she is following with Ortho and recently had MRI of her lumbar spine on 06/01/24 showing bilateral facet osteoarthritis with edematous change, she had a protrusion of the disc at L2-L3 with mild foraminal encroachment on the right but no definite neural compression, chronic endplate Schmorl's nodes at T11-12, T12-L1 and L1-2 but without any edema.  She is waiting to hear back from Ortho regarding  results and plan.   Plan:  Continue Linzess  290 mcg daily. Continue following with orthopedics. Follow-up in 6 months or sooner if needed.   Josette Centers, PA-C [facility] 06/10/2024

## 2024-06-10 ENCOUNTER — Ambulatory Visit: Admitting: Gastroenterology

## 2024-06-10 ENCOUNTER — Encounter: Payer: Self-pay | Admitting: Gastroenterology

## 2024-06-10 VITALS — BP 126/78 | HR 76 | Temp 98.6°F | Ht 65.0 in | Wt 253.6 lb

## 2024-06-10 DIAGNOSIS — K5909 Other constipation: Secondary | ICD-10-CM | POA: Diagnosis not present

## 2024-06-10 DIAGNOSIS — R1031 Right lower quadrant pain: Secondary | ICD-10-CM

## 2024-06-10 DIAGNOSIS — K5904 Chronic idiopathic constipation: Secondary | ICD-10-CM

## 2024-06-10 LAB — URINE CULTURE: Culture: 20000 — AB

## 2024-06-10 MED ORDER — LINACLOTIDE 290 MCG PO CAPS: 290.0000 ug | ORAL_CAPSULE | Freq: Every day | ORAL | 3 refills | Status: AC

## 2024-06-10 NOTE — Patient Instructions (Signed)
 Continue taking Linzess  290 mcg daily, 30 minutes before breakfast.   We will see you back in 6 months or sooner if needed.   It has been great working with you!   Josette Centers, PA-C [facility]

## 2024-06-11 ENCOUNTER — Telehealth (HOSPITAL_BASED_OUTPATIENT_CLINIC_OR_DEPARTMENT_OTHER): Payer: Self-pay | Admitting: *Deleted

## 2024-06-11 NOTE — Telephone Encounter (Signed)
 Post ED Visit - Positive Culture Follow-up: Successful Patient Follow-Up  Culture assessed and recommendations reviewed by:  []  Rankin Dee, Pharm.D. []  Venetia Gully, Pharm.D., BCPS AQ-ID []  Garrel Crews, Pharm.D., BCPS []  Almarie Lunger, Pharm.D., BCPS []  [place], [address].D., BCPS, AAHIVP []  Rosaline Bihari, Pharm.D., BCPS, AAHIVP []  Vernell Meier, PharmD, BCPS []  Latanya Hint, PharmD, BCPS []  Donald Medley, PharmD, BCPS [x]  Dorn Buttner, PharmD  Positive urine culture  [x]  Patient discharged without antimicrobial prescription and treatment is now indicated []  Organism is resistant to prescribed ED discharge antimicrobial []  Patient with positive blood cultures  Changes discussed with ED provider: [name], [place] New antibiotic prescription Augmentin  875/125 mg BID x 10 days (qty 20. 0 refills)  Pt is not having any fevers Called to [name], [name], [name]  Contacted patient, date 06/11/24, time 1323   [name] 06/11/2024, 1:25 PM

## 2024-06-11 NOTE — Progress Notes (Signed)
 ED Antimicrobial Stewardship Positive Culture Follow Up   [name] is an 43 y.o. female who presented to [facility] on @ADMITDT @ with a chief complaint of  Chief Complaint  Patient presents with   Abdominal Pain    Recent Results (from the past 720 hours)  Urine Culture     Status: Abnormal   Collection Time: 06/07/24  2:31 PM   Specimen: Urine, Clean Catch  Result Value Ref Range Status   Specimen Description   Final    URINE, CLEAN CATCH Performed at [facility], [address], [place], [place] [address]    Special Requests   Final    NONE Performed at [facility], [address] [place], [place] [address]    Culture 20,000 COLONIES/mL ENTEROCOCCUS FAECALIS (A)  Final   Report Status 06/10/2024 FINAL  Final   Organism ID, Bacteria ENTEROCOCCUS FAECALIS (A)  Final      Susceptibility   Enterococcus faecalis - MIC*    AMPICILLIN <=2 SENSITIVE Sensitive     NITROFURANTOIN <=16 SENSITIVE Sensitive     VANCOMYCIN 2 SENSITIVE Sensitive     * 20,000 COLONIES/mL ENTEROCOCCUS FAECALIS    [x]  Patient discharged originally without antimicrobial agent and treatment is now indicated  If fevers present, return to the ED  If no fevers, begin abx below New antibiotic prescription: Augmentin  875/125 mg tablets take 1 tablet BID x 10 days(Qty 20; Refills 0)  ED Provider: [facility] and MD Francesca Dorn Buttner, PharmD, BCPS 06/11/2024 11:55 AM ED Clinical Pharmacist -  [phone]

## 2024-06-15 ENCOUNTER — Encounter: Payer: Self-pay | Admitting: Orthopaedic Surgery

## 2024-06-15 ENCOUNTER — Ambulatory Visit: Admitting: Orthopaedic Surgery

## 2024-06-15 DIAGNOSIS — M545 Low back pain, unspecified: Secondary | ICD-10-CM

## 2024-06-15 DIAGNOSIS — M5441 Lumbago with sciatica, right side: Secondary | ICD-10-CM

## 2024-06-15 DIAGNOSIS — G8929 Other chronic pain: Secondary | ICD-10-CM

## 2024-06-15 NOTE — Progress Notes (Signed)
 I still hurt  She had MRI of the lumbar spine showing: IMPRESSION: 1. L4-5: Bilateral facet osteoarthritis with edematous change. This could be a cause of back pain or referred facet syndrome pain. No compressive stenosis. 2. L2-3: Shallow protrusion of the disc. Mild foraminal encroachment on the right, but no definite neural compression. 3. Chronic endplate Schmorl's nodes at T11-12, T12-L1 and L1-2 but without any edema to suggest that any are recent or active.  I have explained the findings to her.  I will have her see Dr. Eldonna for injection.  She is agreeable.  Lower back is tender but no spasm, ROM is good, muscle tone and strength normal.  NV intact.  Encounter Diagnoses  Name Primary?   Lumbar pain Yes   Chronic right-sided low back pain with right-sided sciatica    Return in one month.  Call if any problem.  Precautions discussed.  Electronically Signed Lemond Stable, MD 9/3/20251:35 PM

## 2024-06-15 NOTE — Addendum Note (Signed)
 Addended by: DARRA MILLING R on: 06/15/2024 01:39 PM   Modules accepted: Orders

## 2024-06-17 ENCOUNTER — Telehealth: Payer: Self-pay

## 2024-06-17 DIAGNOSIS — F411 Generalized anxiety disorder: Secondary | ICD-10-CM

## 2024-06-17 NOTE — Telephone Encounter (Signed)
 Requesting pre medication for procedure 9/30

## 2024-06-20 MED ORDER — DIAZEPAM 5 MG PO TABS: ORAL_TABLET | ORAL | 0 refills | Status: DC

## 2024-06-20 NOTE — Addendum Note (Signed)
 Addended by: ELDONNA GARDINER POUR on: 06/20/2024 08:22 AM   Modules accepted: Orders

## 2024-06-21 ENCOUNTER — Other Ambulatory Visit: Payer: Self-pay

## 2024-06-21 DIAGNOSIS — E119 Type 2 diabetes mellitus without complications: Secondary | ICD-10-CM

## 2024-07-01 ENCOUNTER — Other Ambulatory Visit: Payer: Self-pay

## 2024-07-01 ENCOUNTER — Telehealth: Payer: Self-pay | Admitting: Orthopaedic Surgery

## 2024-07-01 MED ORDER — HYDROCODONE-ACETAMINOPHEN 5-325 MG PO TABS: ORAL_TABLET | ORAL | 0 refills | Status: DC

## 2024-07-01 NOTE — Telephone Encounter (Signed)
 Dr. Janae pt - spoke w/the pt, she is requesting a refill for Hydrocodone  7.5-325 to be sent to [facility]

## 2024-07-03 ENCOUNTER — Other Ambulatory Visit: Payer: Self-pay

## 2024-07-04 ENCOUNTER — Other Ambulatory Visit: Payer: Self-pay | Admitting: Internal Medicine

## 2024-07-04 DIAGNOSIS — E559 Vitamin D deficiency, unspecified: Secondary | ICD-10-CM

## 2024-07-08 ENCOUNTER — Encounter: Admitting: Physical Medicine and Rehabilitation

## 2024-07-12 ENCOUNTER — Encounter: Admitting: Physical Medicine and Rehabilitation

## 2024-07-13 ENCOUNTER — Encounter: Payer: Self-pay | Admitting: Orthopaedic Surgery

## 2024-07-13 ENCOUNTER — Ambulatory Visit: Admitting: Orthopaedic Surgery

## 2024-07-13 VITALS — BP 135/86 | HR 88 | Wt 257.0 lb

## 2024-07-13 DIAGNOSIS — G8929 Other chronic pain: Secondary | ICD-10-CM

## 2024-07-13 DIAGNOSIS — M5441 Lumbago with sciatica, right side: Secondary | ICD-10-CM | POA: Diagnosis not present

## 2024-07-13 MED ORDER — HYDROCODONE-ACETAMINOPHEN 7.5-325 MG PO TABS: ORAL_TABLET | ORAL | 0 refills | Status: DC

## 2024-07-13 NOTE — Progress Notes (Signed)
 I still hurt.  Her appointment to see Dr. Eldonna was changed to 07-22-24.  She is still in pain.  Nothing helps.  The pain medicine does help a little.  I will renew it today.   She has no new episodes of trauma. She continues to have right sided paresthesias.  She has no weakness.  Lower back is tender, ROM is decreased, muscle tone and strength are normal, NV intact, gait normal.  Encounter Diagnosis  Name Primary?   Chronic right-sided low back pain with right-sided sciatica Yes   I have reviewed the [place]  Controlled Substance Reporting System web site prior to prescribing narcotic medicine for this patient.  I have informed the patient I will be retiring from medical practice and from this office on July 14, 2024.  The patient has been offered continuing care with Dr. Margrette or Dr. Onesimo of this office.  The patient may choose another provider and the records will be forwarded after proper signature and notification.  Patient understands and agrees.  See Dr. Eldonna 07-22-24 for epidural.  Return here in six weeks.  Call if any problem.  Precautions discussed.  Electronically Signed Lemond Stable, MD 10/1/20259:42 AM

## 2024-07-19 ENCOUNTER — Ambulatory Visit

## 2024-07-19 VITALS — BP 125/86 | HR 103 | Ht 65.0 in | Wt 257.1 lb

## 2024-07-19 DIAGNOSIS — Z6841 Body Mass Index (BMI) 40.0 and over, adult: Secondary | ICD-10-CM

## 2024-07-19 DIAGNOSIS — E119 Type 2 diabetes mellitus without complications: Secondary | ICD-10-CM

## 2024-07-19 MED ORDER — PHENTERMINE HCL 37.5 MG PO TABS: 37.5000 mg | ORAL_TABLET | Freq: Every day | ORAL | 2 refills | Status: AC

## 2024-07-19 NOTE — Progress Notes (Unsigned)
 Established Patient Office Visit  Subjective   Patient ID: [name], female    DOB: 1981-06-28  Age: 43 y.o. MRN: [id number]  Chief Complaint  Patient presents with   Medical Management of Chronic Issues    3 mon follow up     HPI Discussed the use of AI scribe software for clinical note transcription with the patient, who gave verbal consent to proceed.  History of Present Illness   [name] is a 43 year old female with diabetes who presents for a three-month follow-up and blood work.  Hyperglycemia and antihyperglycemic medication intolerance - Diabetes mellitus with follow-up for management and A1c recheck - Previously trialed Trulicity , which improved A1c but did not result in weight loss; no longer taking Trulicity  - Previously trialed metformin  this year, but not covered by insurance [employer] does not cover Ozempic   Obesity and pharmacologic weight management - Currently taking phentermine  for weight loss, but perceives it as ineffective - Inquires about switching from phentermine  capsules to pills, as her daughter finds the pills effective - Insurance does not cover phentermine ; obtains it through [employer] for approximately $32 per month  Cutaneous lesions - Scaly skin lesions described as 'barnacles' - Lesions appear particularly when wearing shorts while driving      Patient Active Problem List   Diagnosis Date Noted   BMI 40.0-44.9, adult (HCC) 07/22/2024   Left lower quadrant abdominal pain 03/03/2024   Acute bacterial rhinosinusitis 02/01/2024   Dysphagia 03/05/2023   Gastroesophageal reflux disease 03/05/2023   Change in bowel habits 03/05/2023   Nausea without vomiting 03/05/2023   Loss of weight 03/05/2023   Iron deficiency anemia 03/04/2023   Diarrhea 02/18/2023   Abdominal pain, RUQ 02/18/2023   H/O seasonal allergies 08/25/2022   Type 2 diabetes mellitus without complications (HCC) 07/22/2022   Need for Tdap vaccination 07/22/2022    Menorrhagia with regular cycle    Vitamin D  deficiency 02/18/2022   Annual physical exam 02/18/2022   Prediabetes 02/18/2022   Refused influenza vaccine 12/17/2021   Hot flashes 12/17/2021   Bloody discharge from left nipple 03/21/2021   Right hip and buttock pain, ishial bursitis 07/20/2020   Anxiety 03/29/2019   Contact dermatitis 03/29/2019   Dysmenorrhea 06/30/2018   Hyperlipidemia 09/27/2015   Morbid obesity (HCC) 09/27/2015   Cigarette nicotine dependence, uncomplicated 09/27/2015   Cholecystitis with cholelithiasis 07/13/2014    ROS    Objective:     BP 125/86   Pulse (!) 103   Ht 5' 5 (1.651 m)   Wt 257 lb 1.3 oz (116.6 kg)   SpO2 96%   BMI 42.78 kg/m  BP Readings from Last 3 Encounters:  07/22/24 103/68  07/19/24 125/86  07/13/24 135/86   Wt Readings from Last 3 Encounters:  07/19/24 257 lb 1.3 oz (116.6 kg)  07/13/24 257 lb (116.6 kg)  06/10/24 253 lb 9.6 oz (115 kg)      Physical Exam Vitals and nursing note reviewed.  Constitutional:      Appearance: Normal appearance. She is obese.  HENT:     Head: Normocephalic.  Eyes:     Extraocular Movements: Extraocular movements intact.     Pupils: Pupils are equal, round, and reactive to light.  Cardiovascular:     Rate and Rhythm: Normal rate and regular rhythm.  Pulmonary:     Effort: Pulmonary effort is normal.     Breath sounds: Normal breath sounds.  Musculoskeletal:     Cervical back: Normal  range of motion and neck supple.  Neurological:     Mental Status: She is alert and oriented to person, place, and time.  Psychiatric:        Mood and Affect: Mood normal.        Thought Content: Thought content normal.        Last CBC Lab Results  Component Value Date   WBC 5.3 06/07/2024   HGB 13.4 06/07/2024   HCT 38.9 06/07/2024   MCV 92.4 06/07/2024   MCH 31.8 06/07/2024   RDW 13.4 06/07/2024   PLT 306 06/07/2024   Last metabolic panel Lab Results  Component Value Date   GLUCOSE 106  (H) 07/19/2024   NA 138 07/19/2024   K 3.9 07/19/2024   CL 107 (H) 07/19/2024   CO2 17 (L) 07/19/2024   BUN 13 07/19/2024   CREATININE 1.06 (H) 07/19/2024   GFRNONAA >60 06/07/2024   CALCIUM  9.5 07/19/2024   PROT 7.2 07/19/2024   ALBUMIN 4.7 07/19/2024   LABGLOB 2.5 07/19/2024   AGRATIO 1.8 02/18/2023   BILITOT 0.3 07/19/2024   ALKPHOS 52 07/19/2024   AST 29 07/19/2024   ALT 36 (H) 07/19/2024   ANIONGAP 12 06/07/2024   Last lipids Lab Results  Component Value Date   CHOL 153 04/13/2024   HDL 38 (L) 04/13/2024   LDLCALC 97 04/13/2024   TRIG 93 04/13/2024   CHOLHDL 4.0 04/13/2024   Last hemoglobin A1c Lab Results  Component Value Date   HGBA1C 5.6 07/19/2024   Last thyroid functions Lab Results  Component Value Date   TSH 1.100 10/16/2023   Last vitamin D  Lab Results  Component Value Date   VD25OH 46.7 04/13/2024   Last vitamin B12 and Folate Lab Results  Component Value Date   VITAMINB12 643 10/16/2023   FOLATE 4.3 10/16/2023      The 10-year ASCVD risk score (Arnett DK, et al., 2019) is: 1.1%    Assessment & Plan:   Problem List Items Addressed This Visit       Endocrine   Type 2 diabetes mellitus without complications (HCC) - Primary   Type 2 diabetes previously controlled on Trulicity , now requires A1c re-evaluation. - Order A1c test.      Relevant Orders   CMP14+EGFR (Completed)   HgB A1c (Completed)     Other   BMI 40.0-44.9, adult (HCC)   Obesity management with phentermine  capsules ineffective. Insurance limits medication options. Discussed potential switch to phentermine  tablets. - Prescribe phentermine  tablets.      Relevant Medications   phentermine  (ADIPEX-P ) 37.5 MG tablet    No follow-ups on file.    Leita Longs, FNP

## 2024-07-20 LAB — CMP14+EGFR
ALT: 36 IU/L — ABNORMAL HIGH (ref 0–32)
AST: 29 IU/L (ref 0–40)
Albumin: 4.7 g/dL (ref 3.9–4.9)
Alkaline Phosphatase: 52 IU/L (ref 41–116)
BUN/Creatinine Ratio: 12 (ref 9–23)
BUN: 13 mg/dL (ref 6–24)
Bilirubin Total: 0.3 mg/dL (ref 0.0–1.2)
CO2: 17 mmol/L — ABNORMAL LOW (ref 20–29)
Calcium: 9.5 mg/dL (ref 8.7–10.2)
Chloride: 107 mmol/L — ABNORMAL HIGH (ref 96–106)
Creatinine, Ser: 1.06 mg/dL — ABNORMAL HIGH (ref 0.57–1.00)
Globulin, Total: 2.5 g/dL (ref 1.5–4.5)
Glucose: 106 mg/dL — ABNORMAL HIGH (ref 70–99)
Potassium: 3.9 mmol/L (ref 3.5–5.2)
Sodium: 138 mmol/L (ref 134–144)
Total Protein: 7.2 g/dL (ref 6.0–8.5)
eGFR: 66 mL/min/1.73 (ref >59)

## 2024-07-20 LAB — HEMOGLOBIN A1C
Est. average glucose Bld gHb Est-mCnc: 114 mg/dL
Hgb A1c MFr Bld: 5.6 % (ref 4.8–5.6)

## 2024-07-22 ENCOUNTER — Ambulatory Visit: Admitting: Physical Medicine and Rehabilitation

## 2024-07-22 ENCOUNTER — Ambulatory Visit: Payer: Self-pay

## 2024-07-22 ENCOUNTER — Other Ambulatory Visit: Payer: Self-pay

## 2024-07-22 VITALS — BP 103/68 | HR 75

## 2024-07-22 DIAGNOSIS — M47816 Spondylosis without myelopathy or radiculopathy, lumbar region: Secondary | ICD-10-CM

## 2024-07-22 DIAGNOSIS — Z6841 Body Mass Index (BMI) 40.0 and over, adult: Secondary | ICD-10-CM | POA: Insufficient documentation

## 2024-07-22 MED ORDER — METHYLPREDNISOLONE ACETATE 80 MG/ML IJ SUSP
40.0000 mg | Freq: Once | INTRAMUSCULAR | Status: AC
  Administered 2024-07-22: 40 mg

## 2024-07-22 NOTE — Assessment & Plan Note (Signed)
 Obesity management with phentermine  capsules ineffective. Insurance limits medication options. Discussed potential switch to phentermine  tablets. - Prescribe phentermine  tablets.

## 2024-07-22 NOTE — Assessment & Plan Note (Signed)
 Type 2 diabetes previously controlled on Trulicity , now requires A1c re-evaluation. - Order A1c test.

## 2024-07-22 NOTE — Progress Notes (Signed)
 Pain Scale   Average Pain 10 Patient advised she has lower back pain radiating to right leg without relief, pain is constant.        +Driver, -BT, -Dye Allergies.

## 2024-08-01 NOTE — Progress Notes (Signed)
 [name] - 43 y.o. female MRN [id number]  Date of birth: [birth year]  Office Visit Note: Visit Date: 07/22/2024 PCP: Bevely Doffing, FNP Referred by: Bevely Doffing, FNP  Subjective: Chief Complaint  Patient presents with   Lower Back - Pain   HPI:  [name] is a 43 y.o. female who comes in today at the request of Dr. Lemond Stable for planned Right  L4-5 Lumbar facet/medial branch block with fluoroscopic guidance.  The patient has failed conservative care including home exercise, medications, time and activity modification.  This injection will be diagnostic and hopefully therapeutic.  Please see requesting physician notes for further details and justification.  Exam has shown concordant pain with facet joint loading.   ROS Otherwise per HPI.  Assessment & Plan: Visit Diagnoses:    ICD-10-CM   1. Spondylosis without myelopathy or radiculopathy, lumbar region  M47.816 XR C-ARM NO REPORT    Facet Injection    methylPREDNISolone  acetate (DEPO-MEDROL ) injection 40 mg      Plan: No additional findings.   Meds & Orders:  Meds ordered this encounter  Medications   methylPREDNISolone  acetate (DEPO-MEDROL ) injection 40 mg    Orders Placed This Encounter  Procedures   Facet Injection   XR C-ARM NO REPORT    Follow-up: Return for visit to requesting provider as needed.   Procedures: No procedures performed  Lumbar Facet Joint Intra-Articular Injection(s) with Fluoroscopic Guidance  Patient: [name]      Date of Birth: 1981/07/18 MRN: [id number] PCP: Bevely Doffing, FNP      Visit Date: 07/22/2024   Universal Protocol:    Date/Time: 07/22/2024  Consent Given By: the patient  Position: PRONE   Additional Comments: Vital signs were monitored before and after the procedure. Patient was prepped and draped in the usual sterile fashion. The correct patient, procedure, and site was verified.   Injection Procedure Details:  Procedure Site One Meds  Administered:  Meds ordered this encounter  Medications   methylPREDNISolone  acetate (DEPO-MEDROL ) injection 40 mg     Laterality: Right  Location/Site:  L4-L5  Needle size: 22 guage  Needle type: Spinal  Needle Placement: Articular  Findings:  -Comments: Excellent flow of contrast producing a partial arthrogram.  Procedure Details: The fluoroscope beam is vertically oriented in AP, and the inferior recess is visualized beneath the lower pole of the inferior apophyseal process, which represents the target point for needle insertion. When direct visualization is difficult the target point is located at the medial projection of the vertebral pedicle. The region overlying each aforementioned target is locally anesthetized with a 1 to 2 ml. volume of 1% Lidocaine  without Epinephrine.   The spinal needle was inserted into each of the above mentioned facet joints using biplanar fluoroscopic guidance. A 0.25 to 0.5 ml. volume of Isovue-250 was injected and a partial facet joint arthrogram was obtained. A single spot film was obtained of the resulting arthrogram.    One to 1.25 ml of the steroid/anesthetic solution was then injected into each of the facet joints noted above.   Additional Comments:  No complications occurred Dressing: 2 x 2 sterile gauze and Band-Aid    Post-procedure details: Patient was observed during the procedure. Post-procedure instructions were reviewed.  Patient left the clinic in stable condition.    Clinical History: MRI LUMBAR SPINE WITHOUT CONTRAST   TECHNIQUE: Multiplanar, multisequence MR imaging of the lumbar spine was performed. No intravenous contrast was administered.   COMPARISON:  Radiography 04/27/2024  FINDINGS: Segmentation:  5 lumbar type vertebral bodies.   Alignment:  Mild thoracolumbar kyphosis.   Vertebrae: Chronic endplate Schmorl's nodes at T11-12, T12-L1 and L1-2 but without any edema to suggest that any are recent or  active. Some edematous changes so she aided with the L4-5 facet joints could relate to back pain or referred facet syndrome pain.   Conus medullaris and cauda equina: Conus extends to the T12-L1 level. Conus and cauda equina appear normal.   Paraspinal and other soft tissues: Negative   Disc levels:   Minimal non-compressive disc bulges from T11-12 through L1-2.   L2-3: Shallow protrusion of the disc. Slight indentation of the thecal sac. Mild foraminal encroachment on the right, but no definite neural compression.   L3-4: Normal interspace.   L4-5: Normal disc. Bilateral facet osteoarthritis with edematous change. No compressive stenosis. The facet arthritis could be a cause of back pain or referred facet syndrome pain.   L5-S1: Normal appearance of the disc. Minimal facet arthritis. No stenosis.   IMPRESSION: 1. L4-5: Bilateral facet osteoarthritis with edematous change. This could be a cause of back pain or referred facet syndrome pain. No compressive stenosis. 2. L2-3: Shallow protrusion of the disc. Mild foraminal encroachment on the right, but no definite neural compression. 3. Chronic endplate Schmorl's nodes at T11-12, T12-L1 and L1-2 but without any edema to suggest that any are recent or active.     Electronically Signed   By: Oneil Officer M.D.   On: 06/08/2024 10:36     Objective:  VS:  HT:    WT:   BMI:     BP:103/68  HR:75bpm  TEMP: ( )  RESP:  Physical Exam Vitals and nursing note reviewed.  Constitutional:      General: She is not in acute distress.    Appearance: Normal appearance. She is not ill-appearing.  HENT:     Head: Normocephalic and atraumatic.     Right Ear: External ear normal.     Left Ear: External ear normal.  Eyes:     Extraocular Movements: Extraocular movements intact.  Cardiovascular:     Rate and Rhythm: Normal rate.     Pulses: Normal pulses.  Pulmonary:     Effort: Pulmonary effort is normal. No respiratory distress.   Abdominal:     General: There is no distension.     Palpations: Abdomen is soft.  Musculoskeletal:        General: Tenderness present.     Cervical back: Neck supple.     Right lower leg: No edema.     Left lower leg: No edema.     Comments: Patient has good distal strength with no pain over the greater trochanters.  No clonus or focal weakness.  Skin:    Findings: No erythema, lesion or rash.  Neurological:     General: No focal deficit present.     Mental Status: She is alert and oriented to person, place, and time.     Sensory: No sensory deficit.     Motor: No weakness or abnormal muscle tone.     Coordination: Coordination normal.  Psychiatric:        Mood and Affect: Mood normal.        Behavior: Behavior normal.      Imaging: No results found.

## 2024-08-01 NOTE — Procedures (Signed)
 Lumbar Facet Joint Intra-Articular Injection(s) with Fluoroscopic Guidance  Patient: [name]      Date of Birth: 09-13-1981 MRN: [id number] PCP: Bevely Doffing, FNP      Visit Date: 07/22/2024   Universal Protocol:    Date/Time: 07/22/2024  Consent Given By: the patient  Position: PRONE   Additional Comments: Vital signs were monitored before and after the procedure. Patient was prepped and draped in the usual sterile fashion. The correct patient, procedure, and site was verified.   Injection Procedure Details:  Procedure Site One Meds Administered:  Meds ordered this encounter  Medications   methylPREDNISolone  acetate (DEPO-MEDROL ) injection 40 mg     Laterality: Right  Location/Site:  L4-L5  Needle size: 22 guage  Needle type: Spinal  Needle Placement: Articular  Findings:  -Comments: Excellent flow of contrast producing a partial arthrogram.  Procedure Details: The fluoroscope beam is vertically oriented in AP, and the inferior recess is visualized beneath the lower pole of the inferior apophyseal process, which represents the target point for needle insertion. When direct visualization is difficult the target point is located at the medial projection of the vertebral pedicle. The region overlying each aforementioned target is locally anesthetized with a 1 to 2 ml. volume of 1% Lidocaine  without Epinephrine.   The spinal needle was inserted into each of the above mentioned facet joints using biplanar fluoroscopic guidance. A 0.25 to 0.5 ml. volume of Isovue-250 was injected and a partial facet joint arthrogram was obtained. A single spot film was obtained of the resulting arthrogram.    One to 1.25 ml of the steroid/anesthetic solution was then injected into each of the facet joints noted above.   Additional Comments:  No complications occurred Dressing: 2 x 2 sterile gauze and Band-Aid    Post-procedure details: Patient was observed during the  procedure. Post-procedure instructions were reviewed.  Patient left the clinic in stable condition.

## 2024-08-02 ENCOUNTER — Other Ambulatory Visit: Payer: Self-pay

## 2024-08-08 ENCOUNTER — Other Ambulatory Visit: Payer: Self-pay

## 2024-08-11 ENCOUNTER — Other Ambulatory Visit: Payer: Self-pay

## 2024-08-11 DIAGNOSIS — E119 Type 2 diabetes mellitus without complications: Secondary | ICD-10-CM

## 2024-08-12 ENCOUNTER — Encounter: Payer: Self-pay | Admitting: Orthopedic Surgery

## 2024-08-13 MED ORDER — HYDROCODONE-ACETAMINOPHEN 7.5-325 MG PO TABS: ORAL_TABLET | ORAL | 0 refills | Status: DC

## 2024-08-15 ENCOUNTER — Encounter: Payer: Self-pay | Admitting: Radiology

## 2024-08-24 ENCOUNTER — Ambulatory Visit: Admitting: Orthopedic Surgery

## 2024-08-24 ENCOUNTER — Encounter: Payer: Self-pay | Admitting: Orthopedic Surgery

## 2024-08-24 VITALS — BP 115/76 | HR 78 | Ht 65.0 in | Wt 258.0 lb

## 2024-08-24 DIAGNOSIS — G8929 Other chronic pain: Secondary | ICD-10-CM

## 2024-08-24 DIAGNOSIS — M5441 Lumbago with sciatica, right side: Secondary | ICD-10-CM

## 2024-08-24 NOTE — Progress Notes (Signed)
 Orthopaedic Clinic Return  Assessment: [name] is a 43 y.o. female with the following: Right sided low back pain, with sciatica  Plan: Mrs. Leggitt reports that a recent injection in that her lower back improved her symptoms for just a few days.  Since then, the pain has returned.  She continues to report pain radiating into the right leg.  She has a positive straight leg raise on the right.  I recommend that she contact Dr. Eldonna to see when or if she could get another injection.  We can also have her see Dr. Georgina for further discussion regarding her ongoing symptoms.  She states understanding.  She will contact Dr. Eldonna directly.  Follow-up: Return for Contact Dr. Eldonna for appropriate follow-up.   Subjective:  Chief Complaint  Patient presents with   Back Pain    Pt has had one injection from Dr. Eldonna 07/22/24, pt states she felt relief for 3-4 days and pain came back the same as before. Pt states she's unable do household chores without taking multiple breaks and she gets more relief from standing than sitting.     History of Present Illness: [name] is a 43 y.o. female who returns to clinic for repeat evaluation of low back pain.  She has been seen by Dr. Brenna for her back.  She recently saw Dr. Eldonna, and had an injection.  She reports that she had improved symptoms for 3-4 days.  However, the pain is returned.  She continues to have radiating pain in the right leg.  She denies numbness and tingling.  No issues with bowel or bladder function.  Review of Systems: No fevers or chills No numbness or tingling No chest pain No shortness of breath No bowel or bladder dysfunction No GI distress No headaches   Objective: BP 115/76   Pulse 78   Ht 5' 5 (1.651 m)   Wt 258 lb (117 kg)   BMI 42.93 kg/m   Physical Exam:  Alert and oriented.  No acute distress.  Steady gait  Tenderness in right side low back.  Positive right leg raise on the right.  2+  patellar tendon reflex bilaterally.  She has good lower body strength.  Sensation intact throughout bilateral lower extremities.  IMAGING: I personally ordered and reviewed the following images:   No new imaging obtained today.  Lumbar spine MRI  IMPRESSION: 1. L4-5: Bilateral facet osteoarthritis with edematous change. This could be a cause of back pain or referred facet syndrome pain. No compressive stenosis. 2. L2-3: Shallow protrusion of the disc. Mild foraminal encroachment on the right, but no definite neural compression. 3. Chronic endplate Schmorl's nodes at T11-12, T12-L1 and L1-2 but without any edema to suggest that any are recent or active.  Oneil DELENA Horde, MD 08/24/2024 9:25 AM

## 2024-08-24 NOTE — Patient Instructions (Signed)
 Recommend you contact Dr. Lyda office to schedule appropriate follow-up.  We can also refer you to Dr. Georgina who is a spine specialist in [place].

## 2024-08-31 ENCOUNTER — Other Ambulatory Visit: Payer: Self-pay

## 2024-08-31 DIAGNOSIS — E119 Type 2 diabetes mellitus without complications: Secondary | ICD-10-CM

## 2024-09-05 ENCOUNTER — Telehealth: Payer: Self-pay | Admitting: Physical Medicine and Rehabilitation

## 2024-09-05 NOTE — Telephone Encounter (Signed)
 Pt called requesting an appt for injection. Last injection 07/22/24. Pt phone number is [phone].

## 2024-09-06 ENCOUNTER — Other Ambulatory Visit: Payer: Self-pay

## 2024-09-06 ENCOUNTER — Other Ambulatory Visit: Payer: Self-pay | Admitting: Gastroenterology

## 2024-09-06 DIAGNOSIS — K219 Gastro-esophageal reflux disease without esophagitis: Secondary | ICD-10-CM

## 2024-09-07 MED ORDER — HYDROCODONE-ACETAMINOPHEN 7.5-325 MG PO TABS: ORAL_TABLET | ORAL | 0 refills | Status: DC

## 2024-09-07 NOTE — Addendum Note (Signed)
 Addended by: ONESIMO ANES A on: 09/07/2024 03:36 PM   Modules accepted: Orders

## 2024-09-16 ENCOUNTER — Ambulatory Visit: Admitting: Physical Medicine and Rehabilitation

## 2024-09-26 ENCOUNTER — Ambulatory Visit: Admitting: Physical Medicine and Rehabilitation

## 2024-10-02 ENCOUNTER — Other Ambulatory Visit: Payer: Self-pay

## 2024-10-02 DIAGNOSIS — E559 Vitamin D deficiency, unspecified: Secondary | ICD-10-CM

## 2024-10-04 DIAGNOSIS — E119 Type 2 diabetes mellitus without complications: Secondary | ICD-10-CM

## 2024-10-07 ENCOUNTER — Other Ambulatory Visit: Payer: Self-pay

## 2024-10-11 MED ORDER — HYDROCODONE-ACETAMINOPHEN 7.5-325 MG PO TABS: ORAL_TABLET | ORAL | 0 refills | Status: DC

## 2024-10-11 NOTE — Addendum Note (Signed)
 Addended by: ONESIMO ANES A on: 10/11/2024 09:56 AM   Modules accepted: Orders

## 2024-10-25 ENCOUNTER — Encounter: Payer: Self-pay | Admitting: Physical Medicine and Rehabilitation

## 2024-10-25 ENCOUNTER — Ambulatory Visit: Admitting: Physical Medicine and Rehabilitation

## 2024-10-25 DIAGNOSIS — M5416 Radiculopathy, lumbar region: Secondary | ICD-10-CM

## 2024-10-25 DIAGNOSIS — M47816 Spondylosis without myelopathy or radiculopathy, lumbar region: Secondary | ICD-10-CM

## 2024-10-25 DIAGNOSIS — G8929 Other chronic pain: Secondary | ICD-10-CM

## 2024-10-25 DIAGNOSIS — M5441 Lumbago with sciatica, right side: Secondary | ICD-10-CM

## 2024-10-25 DIAGNOSIS — G894 Chronic pain syndrome: Secondary | ICD-10-CM

## 2024-10-25 NOTE — Progress Notes (Signed)
 "  [name] - 44 y.o. female MRN [id number]  Date of birth: 1980/10/29  Office Visit Note: Visit Date: 10/25/2024 PCP: Bevely Doffing, FNP Referred by: Bevely Doffing, FNP  Subjective: Chief Complaint  Patient presents with   Lower Back - Pain   HPI: [name] is a 44 y.o. female who comes in today for evaluation of chronic, worsening and severe right sided lower back pain radiating down right posterolateral thigh to knee. Pain ongoing for several years, worsens when moving from sitting to standing position. Also reports feeling of weakness to entire right leg. She describes pain as constant sore, aching and throbbing sensation, currently rates as 8 out of 10. Some relief of pain with home exercise regimen, rest and use of medications. She is prescribed 30 tablets of 7.5/325 mg Norco by Dr. Onesimo. States she was told that our office would need to take over prescribing of this medication. Lumbar MRI imaging from August of 2025 shows bilateral facet osteoarthritis at L4-L5 with edematous change. This could be a cause of back pain or referred facet syndrome pain. No significant lateral recess/foraminal stenosis. No high grade spinal canal stenosis. History of right greater trochanter injections with Dr. Brenna in 2021, minimal relief of pain with these procedures. More recently, she underwent right L4-L5 facet joint injection in our office on 07/22/2024. She reports greater than 80% for 3 days. She reports significant short term relief of pain post injection. Patient denies focal weakness, numbness and tingling. No recent trauma or falls. No bowel/bladder incontinence.       Review of Systems  Musculoskeletal:  Positive for back pain.  Neurological:  Positive for weakness.  All other systems reviewed and are negative.  Otherwise per HPI.  Assessment & Plan: Visit Diagnoses:    ICD-10-CM   1. Chronic right-sided low back pain with right-sided sciatica  M54.41 Ambulatory referral to  Pain Clinic   G89.29 Ambulatory referral to Physical Medicine Rehab    2. Lumbar radiculopathy  M54.16 Ambulatory referral to Pain Clinic    Ambulatory referral to Physical Medicine Rehab    3. Facet arthropathy, lumbar  M47.816 Ambulatory referral to Pain Clinic    Ambulatory referral to Physical Medicine Rehab    4. Chronic pain syndrome  G89.4 Ambulatory referral to Pain Clinic    Ambulatory referral to Physical Medicine Rehab       Plan: Findings:  Chronic, worsening and severe right sided lower back pain radiating down right posterolateral thigh to knee. Patient continues to have severe pain despite good conservative therapies such as home exercise regimen, rest and use of medications. Patients clinical presentation and exam today do seem more radicular, however do not directly correlate with recent lumbar MRI imaging. Short term relief of pain with previous lumbar facet joint injection. We discussed treatment plan in detail today. Next step is to place order for diagnostic and hopefully therapeutic right L5-S1 interlaminar epidural steroid injection under fluoroscopic guidance. She is not currently taking anticoagulant medication. If good relief of pain with injection we can repeat this procedure infrequently as needed. Her exam today is non focal, good strength noted to bilateral lower extremities. No myelopathic symptoms noted.   We also discussed continued pain management. I explained to her that we do not manage chronic pain long term with narcotic medications. I did place referral to [facility] for management of chronic pain. She has no questions at this time.     Meds & Orders: No orders  of the defined types were placed in this encounter.   Orders Placed This Encounter  Procedures   Ambulatory referral to Pain Clinic   Ambulatory referral to Physical Medicine Rehab    Follow-up: Return for Right L5-S1 interlaminar epidural steroid injection .   Procedures: No  procedures performed      Clinical History: MRI LUMBAR SPINE WITHOUT CONTRAST   TECHNIQUE: Multiplanar, multisequence MR imaging of the lumbar spine was performed. No intravenous contrast was administered.   COMPARISON:  Radiography 04/27/2024   FINDINGS: Segmentation:  5 lumbar type vertebral bodies.   Alignment:  Mild thoracolumbar kyphosis.   Vertebrae: Chronic endplate Schmorl's nodes at T11-12, T12-L1 and L1-2 but without any edema to suggest that any are recent or active. Some edematous changes so she aided with the L4-5 facet joints could relate to back pain or referred facet syndrome pain.   Conus medullaris and cauda equina: Conus extends to the T12-L1 level. Conus and cauda equina appear normal.   Paraspinal and other soft tissues: Negative   Disc levels:   Minimal non-compressive disc bulges from T11-12 through L1-2.   L2-3: Shallow protrusion of the disc. Slight indentation of the thecal sac. Mild foraminal encroachment on the right, but no definite neural compression.   L3-4: Normal interspace.   L4-5: Normal disc. Bilateral facet osteoarthritis with edematous change. No compressive stenosis. The facet arthritis could be a cause of back pain or referred facet syndrome pain.   L5-S1: Normal appearance of the disc. Minimal facet arthritis. No stenosis.   IMPRESSION: 1. L4-5: Bilateral facet osteoarthritis with edematous change. This could be a cause of back pain or referred facet syndrome pain. No compressive stenosis. 2. L2-3: Shallow protrusion of the disc. Mild foraminal encroachment on the right, but no definite neural compression. 3. Chronic endplate Schmorl's nodes at T11-12, T12-L1 and L1-2 but without any edema to suggest that any are recent or active.     Electronically Signed   By: Oneil Officer M.D.   On: 06/08/2024 10:36   She reports that she quit smoking about 4 years ago. Her smoking use included cigarettes. She started smoking about  19 years ago. She has a 7.5 pack-year smoking history. She has never used smokeless tobacco.  Recent Labs    04/13/24 1101 07/19/24 1106  HGBA1C 5.8* 5.6    Objective:  VS:  HT:    WT:   BMI:     BP:   HR: bpm  TEMP: ( )  RESP:  Physical Exam Vitals and nursing note reviewed.  HENT:     Head: Normocephalic and atraumatic.     Right Ear: External ear normal.     Left Ear: External ear normal.     Nose: Nose normal.     Mouth/Throat:     Mouth: Mucous membranes are moist.  Eyes:     Extraocular Movements: Extraocular movements intact.  Cardiovascular:     Rate and Rhythm: Normal rate.     Pulses: Normal pulses.  Pulmonary:     Effort: Pulmonary effort is normal.  Abdominal:     General: Abdomen is flat. There is no distension.  Musculoskeletal:        General: Tenderness present.     Cervical back: Normal range of motion.     Comments: Patient rises from seated position to standing without difficulty. Good lumbar range of motion. No pain noted with facet loading. 5/5 strength noted with bilateral hip flexion, knee flexion/extension, ankle dorsiflexion/plantarflexion and  EHL. No clonus noted bilaterally. No pain upon palpation of greater trochanters. No pain with internal/external rotation of bilateral hips. Sensation intact bilaterally. Dysesthesias noted to right L5 and S1 dermatomes. Negative slump test bilaterally. Ambulates without aid, gait steady.     Skin:    General: Skin is warm and dry.     Capillary Refill: Capillary refill takes less than 2 seconds.  Neurological:     General: No focal deficit present.     Mental Status: She is alert and oriented to person, place, and time.  Psychiatric:        Mood and Affect: Mood normal.        Behavior: Behavior normal.     Ortho Exam  Imaging: No results found.  Past Medical/Family/Surgical/Social History: Medications & Allergies reviewed per EMR, new medications updated. Patient Active Problem List   Diagnosis  Date Noted   BMI 40.0-44.9, adult (HCC) 07/22/2024   Left lower quadrant abdominal pain 03/03/2024   Acute bacterial rhinosinusitis 02/01/2024   Dysphagia 03/05/2023   Gastroesophageal reflux disease 03/05/2023   Change in bowel habits 03/05/2023   Nausea without vomiting 03/05/2023   Loss of weight 03/05/2023   Iron deficiency anemia 03/04/2023   Diarrhea 02/18/2023   Abdominal pain, RUQ 02/18/2023   H/O seasonal allergies 08/25/2022   Type 2 diabetes mellitus without complications (HCC) 07/22/2022   Need for Tdap vaccination 07/22/2022   Menorrhagia with regular cycle    Vitamin D  deficiency 02/18/2022   Annual physical exam 02/18/2022   Prediabetes 02/18/2022   Refused influenza vaccine 12/17/2021   Hot flashes 12/17/2021   Bloody discharge from left nipple 03/21/2021   Right hip and buttock pain, ishial bursitis 07/20/2020   Anxiety 03/29/2019   Contact dermatitis 03/29/2019   Dysmenorrhea 06/30/2018   Hyperlipidemia 09/27/2015   Morbid obesity (HCC) 09/27/2015   Cigarette nicotine dependence, uncomplicated 09/27/2015   Cholecystitis with cholelithiasis 07/13/2014   Past Medical History:  Diagnosis Date   Anxiety    Arthritis    Depression    Diabetes (HCC)    GERD (gastroesophageal reflux disease)    Hypercholesteremia    Sinus infection 03/27/2021   Family History  Problem Relation Age of Onset   Heart disease Mother    Diabetes Mother    Heart disease Father    Hypertension Father    Thyroid disease Father    Hyperlipidemia Father    Breast cancer Paternal Grandmother    Breast cancer Paternal Aunt    Cancer - Colon Paternal Uncle    Colon cancer Neg Hx    Past Surgical History:  Procedure Laterality Date   BIOPSY  04/02/2023   Procedure: BIOPSY;  Surgeon: Cindie Carlin POUR, DO;  Location: [facility];  Service: Endoscopy;;   BREAST BIOPSY Left 04/03/2021   Procedure: BREAST BIOPSY; excision of duct lesion;  Surgeon: Kallie Manuelita BROCKS, MD;   Location: [facility];  Service: General;  Laterality: Left;   CHOLECYSTECTOMY N/A 07/13/2014   Procedure: LAPAROSCOPIC CHOLECYSTECTOMY;  Surgeon: Elsie GORMAN Holland, MD;  Location: [facility];  Service: General;  Laterality: N/A;   COLONOSCOPY WITH PROPOFOL  N/A 04/02/2023   Surgeon: Cindie Carlin POUR, DO; Nonbleeding internal hemorrhoids diverticulosis in the sigmoid and transverse colon, three 4-6 mm polyps resected and retrieved.  Recommended repeat colonoscopy in 5 years.  Pathology with tubular adenoma   DENTAL SURGERY  2018   plate top & bottom   DILATATION AND CURETTAGE/HYSTEROSCOPY WITH MINERVA N/A 04/16/2022   Procedure: DILATATION  AND CURETTAGE/HYSTEROSCOPY WITH MINERVA;  Surgeon: Jayne Vonn DEL, MD;  Location: [facility];  Service: Gynecology;  Laterality: N/A;   ESOPHAGOGASTRODUODENOSCOPY (EGD) WITH PROPOFOL  N/A 04/02/2023   Surgeon: Cindie Carlin POUR, DO;  Normal esophagus, gastritis biopsied, normal examined duodenum.  Recommended PPI twice daily. Pathology was benign, no H. pylori.   GIVENS CAPSULE STUDY N/A 07/08/2023   Normal gastric mucosa, slow gastric emptying likely secondary to Trulicity  with incomplete study, few scattered areas of small bowel petechia, 1 possible small bowel erosion.  Patient preferred not to repeat study.   POLYPECTOMY  04/02/2023   Procedure: POLYPECTOMY;  Surgeon: Cindie Carlin POUR, DO;  Location: [facility];  Service: Endoscopy;;   TUBAL LIGATION     Social History   Occupational History   Not on file  Tobacco Use   Smoking status: Former    Current packs/day: 0.00    Average packs/day: 0.5 packs/day for 15.0 years (7.5 ttl pk-yrs)    Types: Cigarettes    Start date: 11/13/2004    Quit date: 11/14/2019    Years since quitting: 4.9   Smokeless tobacco: Never  Vaping Use   Vaping status: Never Used  Substance and Sexual Activity   Alcohol use: No   Drug use: No   Sexual activity: Yes    Birth control/protection: Surgical, Pill   "

## 2024-10-25 NOTE — Progress Notes (Signed)
 Pain Scale   Average Pain 8 Patient advising she has chronic lower back pain that increases when walking and sitting pain decreases when applying heat and lying down.        +Driver, -BT, -Dye Allergies.

## 2024-10-26 ENCOUNTER — Ambulatory Visit: Payer: Self-pay | Admitting: Orthopedic Surgery

## 2024-10-31 ENCOUNTER — Telehealth: Payer: Self-pay | Admitting: Physical Medicine and Rehabilitation

## 2024-10-31 NOTE — Telephone Encounter (Signed)
 Referral refaxed to [phone]

## 2024-10-31 NOTE — Telephone Encounter (Signed)
 Pt called requesting for us  to resend her referral to [facility] for pain Management. [name] says they don't see referral. Also called to see if her referral was approved for injection with [facility]. Please call pt about this matter at [phone].

## 2024-11-01 ENCOUNTER — Telehealth: Payer: Self-pay | Admitting: Physical Medicine and Rehabilitation

## 2024-11-01 ENCOUNTER — Other Ambulatory Visit: Payer: Self-pay | Admitting: Physical Medicine and Rehabilitation

## 2024-11-01 DIAGNOSIS — M5416 Radiculopathy, lumbar region: Secondary | ICD-10-CM

## 2024-11-01 DIAGNOSIS — G8929 Other chronic pain: Secondary | ICD-10-CM

## 2024-11-01 DIAGNOSIS — G894 Chronic pain syndrome: Secondary | ICD-10-CM

## 2024-11-01 NOTE — Telephone Encounter (Signed)
 Pt wants to have her referral for pain management sent to anther provider.    Pt request call back ASAP

## 2024-11-02 NOTE — Telephone Encounter (Signed)
 Referral sent to [facility] [place]

## 2024-11-04 ENCOUNTER — Other Ambulatory Visit: Payer: Self-pay | Admitting: Internal Medicine

## 2024-11-04 ENCOUNTER — Other Ambulatory Visit: Payer: Self-pay

## 2024-11-04 ENCOUNTER — Ambulatory Visit

## 2024-11-04 DIAGNOSIS — E119 Type 2 diabetes mellitus without complications: Secondary | ICD-10-CM

## 2024-11-04 DIAGNOSIS — E785 Hyperlipidemia, unspecified: Secondary | ICD-10-CM

## 2024-11-08 ENCOUNTER — Other Ambulatory Visit: Payer: Self-pay | Admitting: Orthopedic Surgery

## 2024-11-08 MED ORDER — HYDROCODONE-ACETAMINOPHEN 7.5-325 MG PO TABS: ORAL_TABLET | ORAL | 0 refills | Status: AC

## 2024-11-09 ENCOUNTER — Telehealth: Payer: Self-pay | Admitting: Physical Medicine and Rehabilitation

## 2024-11-09 NOTE — Telephone Encounter (Signed)
 Pt called wanting to know if her apt had been made for the epidural in her back. She said that there were some questions about insurance an she has up loaded all that to her My Chart. Also, she said that he place you referred her to in [place] for pain management doesn't do that anymore. Call back number is [phone].

## 2024-11-11 ENCOUNTER — Other Ambulatory Visit: Payer: Self-pay | Admitting: Physical Medicine and Rehabilitation

## 2024-11-11 ENCOUNTER — Telehealth: Payer: Self-pay

## 2024-11-11 DIAGNOSIS — F411 Generalized anxiety disorder: Secondary | ICD-10-CM

## 2024-11-11 MED ORDER — DIAZEPAM 5 MG PO TABS: ORAL_TABLET | ORAL | 0 refills | Status: AC

## 2024-11-11 NOTE — Progress Notes (Signed)
 Pre-procedure diazepam ordered for pre-operative anxiety.

## 2024-11-11 NOTE — Telephone Encounter (Signed)
 Patient states she needs another referral for pain management. [facility] doesn't take her insurance and the clinic in [place] no longer does pain management.

## 2024-11-15 ENCOUNTER — Other Ambulatory Visit: Payer: Self-pay | Admitting: Physical Medicine and Rehabilitation

## 2024-11-15 DIAGNOSIS — M5416 Radiculopathy, lumbar region: Secondary | ICD-10-CM

## 2024-11-15 DIAGNOSIS — G8929 Other chronic pain: Secondary | ICD-10-CM

## 2024-11-15 DIAGNOSIS — G894 Chronic pain syndrome: Secondary | ICD-10-CM

## 2024-11-30 ENCOUNTER — Encounter: Admitting: Physical Medicine and Rehabilitation

## 2024-12-08 ENCOUNTER — Ambulatory Visit

## 2024-12-12 ENCOUNTER — Ambulatory Visit: Admitting: Gastroenterology
